# Patient Record
Sex: Male | Born: 1970 | Race: Black or African American | Hispanic: No | Marital: Single | Smoking: Never smoker
Health system: Southern US, Community
[De-identification: ages and names within clinical notes are randomized; demographics above are authoritative.]

## PROBLEM LIST (undated history)

## (undated) DIAGNOSIS — R011 Cardiac murmur, unspecified: Secondary | ICD-10-CM

## (undated) DIAGNOSIS — E785 Hyperlipidemia, unspecified: Secondary | ICD-10-CM

## (undated) DIAGNOSIS — E119 Type 2 diabetes mellitus without complications: Secondary | ICD-10-CM

## (undated) DIAGNOSIS — F4310 Post-traumatic stress disorder, unspecified: Secondary | ICD-10-CM

## (undated) DIAGNOSIS — A159 Respiratory tuberculosis unspecified: Secondary | ICD-10-CM

## (undated) DIAGNOSIS — F32A Depression, unspecified: Secondary | ICD-10-CM

## (undated) HISTORY — DX: Hyperlipidemia, unspecified: E78.5

## (undated) HISTORY — DX: Type 2 diabetes mellitus without complications: E11.9

## (undated) HISTORY — DX: Cardiac murmur, unspecified: R01.1

## (undated) HISTORY — PX: MULTIPLE TOOTH EXTRACTIONS: SHX2053

## (undated) HISTORY — DX: Depression, unspecified: F32.A

## (undated) HISTORY — DX: Respiratory tuberculosis unspecified: A15.9

## (undated) HISTORY — DX: Post-traumatic stress disorder, unspecified: F43.10

---

## 2018-10-07 ENCOUNTER — Encounter: Payer: Self-pay | Admitting: Internal Medicine

## 2018-10-07 ENCOUNTER — Other Ambulatory Visit: Payer: Self-pay

## 2018-10-07 ENCOUNTER — Ambulatory Visit (INDEPENDENT_AMBULATORY_CARE_PROVIDER_SITE_OTHER): Payer: Self-pay | Admitting: Internal Medicine

## 2018-10-07 VITALS — BP 127/87 | HR 94 | Temp 98.4°F | Ht 68.0 in | Wt 160.3 lb

## 2018-10-07 DIAGNOSIS — X58XXXA Exposure to other specified factors, initial encounter: Secondary | ICD-10-CM

## 2018-10-07 DIAGNOSIS — Z0000 Encounter for general adult medical examination without abnormal findings: Secondary | ICD-10-CM | POA: Insufficient documentation

## 2018-10-07 DIAGNOSIS — K029 Dental caries, unspecified: Secondary | ICD-10-CM

## 2018-10-07 DIAGNOSIS — Z6824 Body mass index (BMI) 24.0-24.9, adult: Secondary | ICD-10-CM

## 2018-10-07 DIAGNOSIS — R202 Paresthesia of skin: Secondary | ICD-10-CM | POA: Insufficient documentation

## 2018-10-07 DIAGNOSIS — R634 Abnormal weight loss: Secondary | ICD-10-CM

## 2018-10-07 DIAGNOSIS — M7541 Impingement syndrome of right shoulder: Secondary | ICD-10-CM | POA: Insufficient documentation

## 2018-10-07 DIAGNOSIS — Z87891 Personal history of nicotine dependence: Secondary | ICD-10-CM

## 2018-10-07 DIAGNOSIS — S025XXA Fracture of tooth (traumatic), initial encounter for closed fracture: Secondary | ICD-10-CM
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
## 2018-10-07 NOTE — Assessment & Plan Note (Signed)
Pt declined the influenza vaccine today. He was willing to get HIV and HepC screening  Plan - ordered HIV and HepC screens

## 2018-10-07 NOTE — Assessment & Plan Note (Signed)
Pt has been experiencing numbness and tingling of his L fingertips and toes for approximately 18 months. States these paresthesias are episodic though only on the L side. He cannot identify any exacerbating or remitting factors. Denies any tremor or clumsiness in his hands. He states these symptoms do not limit his day-to-day activities. Denies focal weakness, vision changes, headaches, or dizziness. Neurologic exam is unremarkable without any focal deficits.  Of note, pt endorses a 60lb unintentional weight loss over the last 18 months. No change in appetite. Pt says he eats his largest meals at breakfast and dinner, and eats a lot of candy in between. He has multiple cavities and a broken molar on exam today.  Assessment - L sided paresthesias  No peripheral neuropathy on exam so uncertain etiology at this time. Pt is young and without apparent risk factors for TIA or stroke. Will order basic labs today to assess for diabetes or thyroid dysfunction.  Plan - ordered CBC, CMP, TSH, A1c, lipid panel, and HIV antibody - will continue to monitor
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
## 2018-10-07 NOTE — Progress Notes (Signed)
Internal Medicine Clinic Attending  I saw and evaluated the patient.  I personally confirmed the key portions of the history and exam documented by Dr. Jones and I reviewed pertinent patient test results.  The assessment, diagnosis, and plan were formulated together and I agree with the documentation in the resident's note.     
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
## 2018-10-08 LAB — CMP14 + ANION GAP
ALT: 29 IU/L (ref 0–44)
AST: 21 IU/L (ref 0–40)
Albumin/Globulin Ratio: 1.5 (ref 1.2–2.2)
Albumin: 4.8 g/dL (ref 4.0–5.0)
Alkaline Phosphatase: 78 IU/L (ref 39–117)
Anion Gap: 17 mmol/L (ref 10.0–18.0)
BUN/Creatinine Ratio: 11 (ref 9–20)
BUN: 9 mg/dL (ref 6–24)
Bilirubin Total: 0.5 mg/dL (ref 0.0–1.2)
CO2: 24 mmol/L (ref 20–29)
Calcium: 10.3 mg/dL — ABNORMAL HIGH (ref 8.7–10.2)
Chloride: 97 mmol/L (ref 96–106)
Creatinine, Ser: 0.82 mg/dL (ref 0.76–1.27)
GFR calc Af Amer: 121 mL/min/{1.73_m2} (ref >59)
GFR calc non Af Amer: 105 mL/min/{1.73_m2} (ref >59)
Globulin, Total: 3.2 g/dL (ref 1.5–4.5)
Glucose: 278 mg/dL — ABNORMAL HIGH (ref 65–99)
Potassium: 4.5 mmol/L (ref 3.5–5.2)
Sodium: 138 mmol/L (ref 134–144)
Total Protein: 8 g/dL (ref 6.0–8.5)

## 2018-10-08 LAB — CBC
Hematocrit: 50.7 % (ref 37.5–51.0)
Hemoglobin: 17.6 g/dL (ref 13.0–17.7)
MCH: 29.4 pg (ref 26.6–33.0)
MCHC: 34.7 g/dL (ref 31.5–35.7)
MCV: 85 fL (ref 79–97)
Platelets: 249 10*3/uL (ref 150–450)
RBC: 5.99 x10E6/uL — ABNORMAL HIGH (ref 4.14–5.80)
RDW: 12.6 % (ref 11.6–15.4)
WBC: 3.2 10*3/uL — ABNORMAL LOW (ref 3.4–10.8)

## 2018-10-08 LAB — HEPATITIS C ANTIBODY: Hep C Virus Ab: <0.1 s/co ratio (ref 0.0–0.9)

## 2018-10-08 LAB — LIPID PANEL
Chol/HDL Ratio: 5.1 ratio — ABNORMAL HIGH (ref 0.0–5.0)
Cholesterol, Total: 210 mg/dL — ABNORMAL HIGH (ref 100–199)
HDL: 41 mg/dL (ref >39)
LDL Chol Calc (NIH): 151 mg/dL — ABNORMAL HIGH (ref 0–99)
Triglycerides: 101 mg/dL (ref 0–149)
VLDL Cholesterol Cal: 18 mg/dL (ref 5–40)

## 2018-10-08 LAB — TSH: TSH: 1.32 u[IU]/mL (ref 0.450–4.500)

## 2018-10-08 LAB — HEMOGLOBIN A1C
Est. average glucose Bld gHb Est-mCnc: 280 mg/dL
Hgb A1c MFr Bld: 11.4 % — ABNORMAL HIGH (ref 4.8–5.6)

## 2018-10-08 LAB — HIV ANTIBODY (ROUTINE TESTING W REFLEX): HIV Screen 4th Generation wRfx: NONREACTIVE

## 2018-10-20 ENCOUNTER — Encounter: Payer: Self-pay | Admitting: Internal Medicine

## 2018-10-20 ENCOUNTER — Ambulatory Visit (INDEPENDENT_AMBULATORY_CARE_PROVIDER_SITE_OTHER): Payer: Self-pay | Admitting: Internal Medicine

## 2018-10-20 ENCOUNTER — Other Ambulatory Visit: Payer: Self-pay

## 2018-10-20 VITALS — BP 114/65 | HR 92 | Temp 98.3°F | Ht 68.0 in | Wt 158.8 lb

## 2018-10-20 DIAGNOSIS — E785 Hyperlipidemia, unspecified: Secondary | ICD-10-CM

## 2018-10-20 DIAGNOSIS — M7541 Impingement syndrome of right shoulder: Secondary | ICD-10-CM

## 2018-10-20 DIAGNOSIS — Z7984 Long term (current) use of oral hypoglycemic drugs: Secondary | ICD-10-CM

## 2018-10-20 DIAGNOSIS — Z79899 Other long term (current) drug therapy: Secondary | ICD-10-CM

## 2018-10-20 DIAGNOSIS — E119 Type 2 diabetes mellitus without complications: Secondary | ICD-10-CM

## 2018-10-20 DIAGNOSIS — Z791 Long term (current) use of non-steroidal anti-inflammatories (NSAID): Secondary | ICD-10-CM

## 2018-10-20 DIAGNOSIS — E118 Type 2 diabetes mellitus with unspecified complications: Secondary | ICD-10-CM

## 2018-10-20 LAB — GLUCOSE, CAPILLARY: Glucose-Capillary: 359 mg/dL — ABNORMAL HIGH (ref 70–99)

## 2018-10-20 MED ORDER — EMPAGLIFLOZIN 10 MG PO TABS: 10.0000 mg | ORAL_TABLET | Freq: Every day | ORAL | 1 refills | Status: DC

## 2018-10-20 MED ORDER — NAPROXEN 500 MG PO TABS: 500.0000 mg | ORAL_TABLET | Freq: Two times a day (BID) | ORAL | 0 refills | Status: AC

## 2018-10-20 MED ORDER — METFORMIN HCL 500 MG PO TABS: 500.0000 mg | ORAL_TABLET | Freq: Every day | ORAL | 3 refills | Status: DC

## 2018-10-20 NOTE — Patient Instructions (Addendum)
[name] [name],  It was nice see you today! I have sent in three prescriptions to the pharmacy for you. Take the Naproxen for 7 days to help control the pain in your shoulder. Please take the metformin and empagliflozin daily for your diabetes. I have sent in a referral to [name], our diabetes coordinator, so you can schedule a future appointment with her.  Please follow-up with me in clinic in 4 weeks for your diabetes medications and shoulder.

## 2018-10-20 NOTE — Progress Notes (Signed)
   CC: diabetes follow-up  HPI:  [name] is a 48 y.o. M with significant PMH of diabetes and hyperlipidemia. Please see problem-based charting for follow-up of his chronic medical conditions.  No past medical history on file.   Review of Systems:  Review of Systems  Constitutional: Negative for chills and fever.  Eyes: Negative for blurred vision.  Respiratory: Negative for shortness of breath.   Cardiovascular: Negative for chest pain.  Gastrointestinal: Negative for nausea and vomiting.  Musculoskeletal: Positive for joint pain (R shoulder).  Endo/Heme/Allergies: Negative for polydipsia.   Physical Exam:  Vitals:   10/20/18 1335  BP: 114/65  Pulse: 92  Temp: 98.3 F (36.8 C)  TempSrc: Oral  SpO2: 100%  Weight: 158 lb 12.8 oz (72 kg)  Height: 5\' 8"  (1.727 m)   Physical Exam Vitals signs and nursing note reviewed.  Constitutional:      General: He is not in acute distress.    Appearance: He is normal weight. He is not ill-appearing.  Cardiovascular:     Rate and Rhythm: Normal rate and regular rhythm.     Heart sounds: Normal heart sounds.  Pulmonary:     Effort: Pulmonary effort is normal.     Breath sounds: Normal breath sounds.  Abdominal:     General: Abdomen is flat.     Palpations: Abdomen is soft.  Musculoskeletal:     Comments: Passive and active ROM limited in R shoulder abduction and flexion. No tenderness to palpation of joint. No pain with internal/external rotation. Negative empty can test.   Skin:    General: Skin is warm and dry.  Neurological:     Mental Status: He is alert.      Assessment & Plan:   See Encounters Tab for problem based charting.  Patient seen with Dr. Dareen Piano

## 2018-10-21 DIAGNOSIS — E119 Type 2 diabetes mellitus without complications: Secondary | ICD-10-CM | POA: Insufficient documentation

## 2018-10-21 NOTE — Assessment & Plan Note (Signed)
Pt continuing to have limited mobility and pain in his R shoulder. States that over the last 2 weeks he has been lifting weights to try and strengthen the joint. His exam is unchanged from prior visit on 9/25. We discussed limiting weight lifting and activities that require raising the arm above the head as the joint and tendons need rest in order to heal. He is interested in imaging because his symptoms have been going on for so long, but is willing to continue conservative management and a trail of NSAID's.  Assessment - Shoulder impingement syndrome  Plan - reiterated to rest and limit strain on the shoulder joint - prescribed naproxen 500mg  BID for 7 days - will reassess at follow-up in 1 month - could consider round of PT in the future

## 2018-10-21 NOTE — Assessment & Plan Note (Addendum)
Pt with diabetes diagnosed on 10/07/2018 with A1c of 11.4. Presents today for initial diabetes education and to start medications. He is somewhat familiar with diabetes because of mother, aunts, and other relatives with the disease. He is eager to make dietary changes and motivated to take medications in order to control his blood sugars.  We discussed what diabetes is, how to interpret the A1c, and medication options that lower the blood sugar. He is interested in starting oral medications as he does not feel comfortable with injections at home. Discussed the benefits and potential side effects of metformin and SGLT-2 inhibitors.   Assessment - Newly diagnosed Type II diabetes, poorly controlled  Plan - will begin metformin 500mg  daily - provided pt with metformin titration information to perform at home (500mg  daily for one week, 500mg  BID for one week, 500mg  in the morning and 1000mg  in the evening for one week, and 1000mg  BID for one week) - start empagliflozin 10mg  daily - referral placed to diabetes services here - follow-up in 1 month - will repeat A1c in 2.5 months

## 2018-10-21 NOTE — Progress Notes (Signed)
Internal Medicine Clinic Attending  I saw and evaluated the patient.  I personally confirmed the key portions of the history and exam documented by Dr. Jones and I reviewed pertinent patient test results.  The assessment, diagnosis, and plan were formulated together and I agree with the documentation in the resident's note.     

## 2018-10-26 ENCOUNTER — Other Ambulatory Visit: Payer: Self-pay | Admitting: Internal Medicine

## 2018-10-26 ENCOUNTER — Telehealth: Payer: Self-pay | Admitting: Dietician

## 2018-10-26 ENCOUNTER — Encounter: Payer: Medicaid Other | Admitting: Dietician

## 2018-10-26 DIAGNOSIS — E119 Type 2 diabetes mellitus without complications: Secondary | ICD-10-CM

## 2018-10-26 MED ORDER — CANAGLIFLOZIN 100 MG PO TABS: 100.0000 mg | ORAL_TABLET | Freq: Every day | ORAL | 2 refills | Status: DC

## 2018-10-26 NOTE — Telephone Encounter (Signed)
Called [name] [name] to offer to reschedule today's appointment. He rescheduled for Friday at 9:15 AM. He says he does not have a meter and was not able to get the Jardiance because it was $600.00 and his insurance did not cover it. He agreed to have me share this with his doctor and we can discuss further at his appointment.

## 2018-10-26 NOTE — Progress Notes (Signed)
Pt was unable to pick-up empagliflozin prescription at [facility] due to cost. New prescription for canagliflozin 100mg  daily sent into [facility]. Pt called and notified of medication switch. He expressed understanding of where to go and is aware of appointment on Friday with [name].

## 2018-10-28 ENCOUNTER — Encounter: Payer: Medicaid Other | Admitting: Dietician

## 2018-10-28 NOTE — Addendum Note (Signed)
Addended by: Hulan Fray on: 10/28/2018 02:42 PM   Modules accepted: Orders

## 2018-11-25 ENCOUNTER — Telehealth: Payer: Self-pay | Admitting: Internal Medicine

## 2018-11-25 DIAGNOSIS — E119 Type 2 diabetes mellitus without complications: Secondary | ICD-10-CM

## 2018-11-25 MED ORDER — METFORMIN HCL 1000 MG PO TABS: 1000.0000 mg | ORAL_TABLET | Freq: Two times a day (BID) | ORAL | 2 refills | Status: DC

## 2018-11-25 NOTE — Telephone Encounter (Signed)
RTC to pt, states he has titrated up on the metformin and is now on metformin 1000mg  BID, denies any side effects.  He will run out of the RX soon and is requesting a new RX be sent to [address].  States he has enough through Monday. Thanks, Higinio Roger, RN,BSN

## 2018-11-25 NOTE — Telephone Encounter (Signed)
Pt is calling regarding dosage of medicine [phone]

## 2018-12-12 ENCOUNTER — Other Ambulatory Visit: Payer: Self-pay | Admitting: Internal Medicine

## 2019-05-03 ENCOUNTER — Encounter: Payer: Self-pay | Admitting: *Deleted

## 2019-07-19 ENCOUNTER — Encounter (HOSPITAL_COMMUNITY): Payer: Self-pay | Admitting: Emergency Medicine

## 2019-07-19 ENCOUNTER — Other Ambulatory Visit: Payer: Self-pay

## 2019-07-19 ENCOUNTER — Emergency Department (HOSPITAL_COMMUNITY)
Admission: EM | Admit: 2019-07-19 | Discharge: 2019-07-19 | Disposition: A | Discharge status: Home / Self Care | Payer: Medicaid Other | Attending: Emergency Medicine | Admitting: Emergency Medicine

## 2019-07-19 DIAGNOSIS — W57XXXA Bitten or stung by nonvenomous insect and other nonvenomous arthropods, initial encounter: Secondary | ICD-10-CM | POA: Insufficient documentation

## 2019-07-19 DIAGNOSIS — S30860A Insect bite (nonvenomous) of lower back and pelvis, initial encounter: Secondary | ICD-10-CM | POA: Insufficient documentation

## 2019-07-19 DIAGNOSIS — Z7984 Long term (current) use of oral hypoglycemic drugs: Secondary | ICD-10-CM | POA: Insufficient documentation

## 2019-07-19 DIAGNOSIS — S20462A Insect bite (nonvenomous) of left back wall of thorax, initial encounter: Secondary | ICD-10-CM

## 2019-07-19 DIAGNOSIS — Y999 Unspecified external cause status: Secondary | ICD-10-CM | POA: Insufficient documentation

## 2019-07-19 DIAGNOSIS — Y939 Activity, unspecified: Secondary | ICD-10-CM | POA: Insufficient documentation

## 2019-07-19 DIAGNOSIS — Y929 Unspecified place or not applicable: Secondary | ICD-10-CM | POA: Insufficient documentation

## 2019-07-19 DIAGNOSIS — E1165 Type 2 diabetes mellitus with hyperglycemia: Secondary | ICD-10-CM | POA: Insufficient documentation

## 2019-07-19 DIAGNOSIS — R739 Hyperglycemia, unspecified: Secondary | ICD-10-CM

## 2019-07-19 HISTORY — DX: Type 2 diabetes mellitus without complications: E11.9

## 2019-07-19 LAB — BASIC METABOLIC PANEL
Anion gap: 14 (ref 5–15)
BUN: 9 mg/dL (ref 6–20)
CO2: 27 mmol/L (ref 22–32)
Calcium: 9.7 mg/dL (ref 8.9–10.3)
Chloride: 96 mmol/L — ABNORMAL LOW (ref 98–111)
Creatinine, Ser: 0.98 mg/dL (ref 0.61–1.24)
GFR calc Af Amer: >60 mL/min (ref >60)
GFR calc non Af Amer: >60 mL/min (ref >60)
Glucose, Bld: 359 mg/dL — ABNORMAL HIGH (ref 70–99)
Potassium: 4.5 mmol/L (ref 3.5–5.1)
Sodium: 137 mmol/L (ref 135–145)

## 2019-07-19 LAB — CBC WITH DIFFERENTIAL/PLATELET
Abs Immature Granulocytes: 0.11 10*3/uL — ABNORMAL HIGH (ref 0.00–0.07)
Basophils Absolute: 0.1 10*3/uL (ref 0.0–0.1)
Basophils Relative: 1 %
Eosinophils Absolute: 0 10*3/uL (ref 0.0–0.5)
Eosinophils Relative: 0 %
HCT: 50.8 % (ref 39.0–52.0)
Hemoglobin: 16.9 g/dL (ref 13.0–17.0)
Immature Granulocytes: 1 %
Lymphocytes Relative: 4 %
Lymphs Abs: 0.7 10*3/uL (ref 0.7–4.0)
MCH: 29.6 pg (ref 26.0–34.0)
MCHC: 33.3 g/dL (ref 30.0–36.0)
MCV: 89 fL (ref 80.0–100.0)
Monocytes Absolute: 2.3 10*3/uL — ABNORMAL HIGH (ref 0.1–1.0)
Monocytes Relative: 13 %
Neutro Abs: 14.4 10*3/uL — ABNORMAL HIGH (ref 1.7–7.7)
Neutrophils Relative %: 81 %
Platelets: 242 10*3/uL (ref 150–400)
RBC: 5.71 MIL/uL (ref 4.22–5.81)
RDW: 12.4 % (ref 11.5–15.5)
WBC: 17.6 10*3/uL — ABNORMAL HIGH (ref 4.0–10.5)
nRBC: 0 % (ref 0.0–0.2)

## 2019-07-19 LAB — CBG MONITORING, ED: Glucose-Capillary: 327 mg/dL — ABNORMAL HIGH (ref 70–99)

## 2019-07-19 MED ORDER — LIDOCAINE-EPINEPHRINE (PF) 2 %-1:200000 IJ SOLN
10.0000 mL | Freq: Once | INTRAMUSCULAR | Status: AC
  Administered 2019-07-19: 10 mL
  Filled 2019-07-19: qty 20

## 2019-07-19 MED ORDER — DOXYCYCLINE HYCLATE 100 MG PO CAPS
100.0000 mg | ORAL_CAPSULE | Freq: Two times a day (BID) | ORAL | 0 refills | Status: DC
Start: 2019-07-19 — End: 2019-09-10

## 2019-07-19 MED ORDER — HYDROCODONE-ACETAMINOPHEN 5-325 MG PO TABS
1.0000 | ORAL_TABLET | Freq: Once | ORAL | Status: AC
  Administered 2019-07-19: 1 via ORAL
  Filled 2019-07-19: qty 1

## 2019-07-19 NOTE — ED Triage Notes (Signed)
Pt reports has bite on his back from over a week. Reports when girl friend squeezes on it will get some grainage from it. Pt reports having numbness and tingling in arm and feet. Pt is a diabetic.

## 2019-07-19 NOTE — ED Provider Notes (Signed)
[facility]-EMERGENCY DEPT Provider Note   CSN: [id number] Arrival date & time: 07/19/19  0707     History Chief Complaint  Patient presents with  . Insect Bite    [name] [name] is a 49 y.o. male.  HPI Patient is a 49 year old male past medical history of DM Maczis Metformin presented today with left back pain secondary to "insect bite ".  He states he has no knowledge of an insect bite he denies any known tick bite or recent outdoor activities/hiking or camping.  He states that approximately 7 days ago he noticed the area approximately 4 days ago his fiance attempted to "pop the zit"at which point it seemed to be more painful and more irritated.  Patient endorses bilateral lower extremity numbness but states that this is been ongoing for several years.  He states that his blood sugars are well controlled though he takes his twice daily Metformin as prescribed.  On review of EMR it appears the patient has been recommended for close follow-up by the internal medicine outpatient group however he has not followed up on these appointments.  Patient denies any knowledge of needing to follow-up.    Past Medical History:  Diagnosis Date  . Diabetes mellitus without complication [facility])     Patient Active Problem List   Diagnosis Date Noted  . Type 2 diabetes mellitus without complication, without long-term current use of insulin (HCC) 10/21/2018  . Paresthesia of left upper and lower extremity 10/07/2018  . Healthcare maintenance 10/07/2018  . Shoulder impingement syndrome, right 10/07/2018    History reviewed. No pertinent surgical history.     No family history on file.  Social History   Tobacco Use  . Smoking status: Never Smoker  . Smokeless tobacco: Current User  Substance Use Topics  . Alcohol use: Never  . Drug use: Never    Home Medications Prior to Admission medications   Medication Sig Start Date End Date Taking? Authorizing Provider    acetaminophen (TYLENOL) 500 MG tablet Take 1,000 mg by mouth every 6 (six) hours as needed for mild pain.   Yes [provider]  amoxicillin (AMOXIL) 500 MG capsule Take 500 mg by mouth once as needed. 03/12/19  Yes [provider]  ibuprofen (ADVIL) 200 MG tablet Take 400 mg by mouth every 6 (six) hours as needed.   Yes [provider]  metFORMIN (GLUCOPHAGE) 1000 MG tablet Take 1 tablet (1,000 mg total) by mouth 2 (two) times daily with a meal. 12/12/18  Yes [name], MD  canagliflozin [facility]) 100 MG TABS tablet Take 1 tablet (100 mg total) by mouth daily before breakfast. Patient not taking: Reported on 07/19/2019 10/26/18   [name], MD  doxycycline (VIBRAMYCIN) 100 MG capsule Take 1 capsule (100 mg total) by mouth 2 (two) times daily. 07/19/19   Gailen Shelter, PA    Allergies    Patient has no known allergies.  Review of Systems   Review of Systems  Constitutional: Negative for chills and fever.  HENT: Negative for congestion.   Respiratory: Negative for shortness of breath.   Cardiovascular: Negative for chest pain.  Gastrointestinal: Negative for abdominal pain.  Musculoskeletal: Negative for neck pain.  Skin: Positive for wound.    Physical Exam Updated Vital Signs BP (!) 148/95 (BP Location: Right Arm)   Pulse (!) 102   Temp (!) 97.3 F (36.3 C) (Oral)   Resp 16   SpO2 99%   Physical Exam Vitals and  nursing note reviewed.  Constitutional:      General: He is in acute distress.  HENT:     Head: Normocephalic and atraumatic.     Nose: Nose normal.     Mouth/Throat:     Mouth: Mucous membranes are dry.  Eyes:     General: No scleral icterus. Cardiovascular:     Rate and Rhythm: Normal rate and regular rhythm.     Pulses: Normal pulses.     Heart sounds: Normal heart sounds.  Pulmonary:     Effort: Pulmonary effort is normal. No respiratory distress.     Breath sounds: No wheezing.  Abdominal:     Palpations: Abdomen is  soft.     Tenderness: There is no abdominal tenderness. There is no guarding or rebound.  Musculoskeletal:     Cervical back: Normal range of motion.     Right lower leg: No edema.     Left lower leg: No edema.  Skin:    General: Skin is warm and dry.     Capillary Refill: Capillary refill takes less than 2 seconds.     Comments: Patient with circular 1 cm in diameter tender single papule through left scapular region.  There is some surrounding erythema.  Area is warm to touch.  Neurological:     Mental Status: He is alert. Mental status is at baseline.     Comments: Alert and oriented to self, place, time and event.   Speech is fluent, clear without dysarthria or dysphasia.   Strength 5/5 in upper/lower extremities  Sensation intact in upper/lower extremities   Normal gait.  Negative Romberg. No pronator drift.  Normal finger-to-nose and feet tapping.  CN I not tested  CN II grossly intact visual fields bilaterally. Did not visualize posterior eye.   CN III, IV, VI PERRLA and EOMs intact bilaterally  CN V Intact sensation to sharp and light touch to the face  CN VII facial movements symmetric  CN VIII not tested  CN IX, X no uvula deviation, symmetric rise of soft palate  CN XI 5/5 SCM and trapezius strength bilaterally  CN XII Midline tongue protrusion, symmetric L/R movements.   Psychiatric:        Mood and Affect: Mood normal.        Behavior: Behavior normal.     ED Results / Procedures / Treatments   Labs (all labs ordered are listed, but only abnormal results are displayed) Labs Reviewed  BASIC METABOLIC PANEL - Abnormal; Notable for the following components:      Result Value   Chloride 96 (*)    Glucose, Bld 359 (*)    All other components within normal limits  CBC WITH DIFFERENTIAL/PLATELET - Abnormal; Notable for the following components:   WBC 17.6 (*)    Neutro Abs 14.4 (*)    Monocytes Absolute 2.3 (*)    Abs Immature Granulocytes 0.11 (*)    All  other components within normal limits  CBG MONITORING, ED - Abnormal; Notable for the following components:   Glucose-Capillary 327 (*)    All other components within normal limits  CBG MONITORING, ED    EKG None  Radiology No results found.  Procedures .Marland KitchenIncision and Drainage  Date/Time: 07/19/2019 10:10 AM Performed by: Gailen Shelter, PA Authorized by: Gailen Shelter, PA   Consent:    Consent obtained:  Verbal   Consent given by:  Patient   Risks discussed:  Bleeding, incomplete drainage, pain and damage to  other organs   Alternatives discussed:  No treatment Universal protocol:    Procedure explained and questions answered to patient or proxy's satisfaction: yes     Relevant documents present and verified: yes     Test results available and properly labeled: yes     Imaging studies available: yes     Required blood products, implants, devices, and special equipment available: yes     Site/side marked: yes     Immediately prior to procedure a time out was called: yes     Patient identity confirmed:  Verbally with patient Location:    Type:  Abscess   Size:  1   Location:  Trunk   Trunk location:  Back Pre-procedure details:    Skin preparation:  Betadine Anesthesia (see MAR for exact dosages):    Anesthesia method:  Local infiltration   Local anesthetic:  Lidocaine 2% WITH epi Procedure type:    Complexity:  Simple Procedure details:    Incision types:  Stab incision   Incision depth:  Subcutaneous   Scalpel blade:  11   Wound management:  Probed and deloculated, irrigated with saline and extensive cleaning   Drainage:  Purulent and bloody   Drainage amount:  Scant Post-procedure details:    Patient tolerance of procedure:  Tolerated well, no immediate complications   (including critical care time)  Medications Ordered in ED Medications  HYDROcodone-acetaminophen (NORCO/VICODIN) 5-325 MG per tablet 1 tablet (1 tablet Oral Given 07/19/19 0843)    lidocaine-EPINEPHrine (XYLOCAINE W/EPI) 2 %-1:200000 (PF) injection 10 mL (10 mLs Infiltration Given 07/19/19 0930)    ED Course  I have reviewed the triage vital signs and the nursing notes.  Pertinent labs & imaging results that were available during my care of the patient were reviewed by me and considered in my medical decision making (see chart for details).     MDM Rules/Calculators/A&P                          Patient is a 49 year old poorly controlled diabetic presented today with abscess to left back.  He is overall well-appearing but does have mildly elevated heart rate and blood pressure.  He is somewhat dehydrated he states he has not drank any water or taken his Metformin today but has drank quite a bit of juice.  He denies any nausea vomiting.  He states he has felt hot but denies any measured fevers or consistent fevers.  He states he has bilateral foot numbness but this appears to be a chronic issue for him.  He states his blood sugars well controlled however CBG was 327.  Will check basic labs.  Physical exam is notable for small 1 cm papule possible abscess.  Ultrasound confirms a small area of fluid collection.  This was incised and drained.  BMP notable for blood sugar of 360.  No anion gap.  Bicarb is within normal limits.  Patient does not appear to be in [facility] or DKA. CBC with 17.6 WBC is neutrophil predominant.  Will treat with doxycycline and recommend close follow-up with PCP  My reassessment heart rate remains in the mid to high 90s.  He has not been tachycardic for me.  He is understanding of importance of follow-up and I had a long patient occasion discussion to the understands the long-term ramifications of not controlling his blood sugar.   Final Clinical Impression(s) / ED Diagnoses Final diagnoses:  Infected insect bite of back, left,  initial encounter  Hyperglycemia    Rx / DC Orders ED Discharge Orders         Ordered    doxycycline (VIBRAMYCIN) 100  MG capsule  2 times daily     Discontinue  Reprint     07/19/19 0945           Gailen ShelterFondaw, Dellamae Rosamilia S, PA 07/19/19 1015    Jacalyn LefevreHaviland, Julie, MD 07/19/19 1017

## 2019-07-19 NOTE — Discharge Instructions (Addendum)
Please take doxy as prescribed. Please take for entire course.  Please drink plenty of water.  As we discussed your blood sugar is significantly elevated today.  Please follow-up promptly with your primary care doctor for recheck of this.  And to be somewhat elevated because of the abscess that you have on your shoulder however I suspect that it may require additional blood sugar medication to control this.

## 2019-09-09 ENCOUNTER — Encounter (HOSPITAL_COMMUNITY): Payer: Self-pay

## 2019-09-09 ENCOUNTER — Other Ambulatory Visit: Payer: Self-pay

## 2019-09-09 ENCOUNTER — Emergency Department (HOSPITAL_COMMUNITY): Payer: HRSA Program

## 2019-09-09 DIAGNOSIS — R05 Cough: Secondary | ICD-10-CM | POA: Diagnosis present

## 2019-09-09 DIAGNOSIS — E119 Type 2 diabetes mellitus without complications: Secondary | ICD-10-CM | POA: Insufficient documentation

## 2019-09-09 DIAGNOSIS — U071 COVID-19: Secondary | ICD-10-CM | POA: Insufficient documentation

## 2019-09-09 DIAGNOSIS — Z7984 Long term (current) use of oral hypoglycemic drugs: Secondary | ICD-10-CM | POA: Insufficient documentation

## 2019-09-09 MED ORDER — ACETAMINOPHEN 325 MG PO TABS
650.0000 mg | ORAL_TABLET | Freq: Once | ORAL | Status: AC | PRN
  Administered 2019-09-09: 650 mg via ORAL
  Filled 2019-09-09: qty 2

## 2019-09-09 NOTE — ED Notes (Signed)
Patient refused tylenol at this time.

## 2019-09-09 NOTE — ED Triage Notes (Addendum)
Patient reports he developed cough 2 weeks ago with fevers and sweats at night. Patient says he now has rib pain on left side also. Patient says he is coughing up phlegm. Denies injury.  Pain is 7/10. Patient says his throat is sore also when he coughs.

## 2019-09-10 ENCOUNTER — Emergency Department (HOSPITAL_COMMUNITY)
Admission: EM | Admit: 2019-09-10 | Discharge: 2019-09-10 | Disposition: A | Discharge status: Home / Self Care | Payer: HRSA Program | Attending: Emergency Medicine | Admitting: Emergency Medicine

## 2019-09-10 ENCOUNTER — Encounter (HOSPITAL_COMMUNITY): Payer: Self-pay | Admitting: Emergency Medicine

## 2019-09-10 DIAGNOSIS — E871 Hypo-osmolality and hyponatremia: Secondary | ICD-10-CM

## 2019-09-10 DIAGNOSIS — U071 COVID-19: Secondary | ICD-10-CM

## 2019-09-10 LAB — COMPREHENSIVE METABOLIC PANEL
ALT: 29 U/L (ref 0–44)
AST: 33 U/L (ref 15–41)
Albumin: 3.6 g/dL (ref 3.5–5.0)
Alkaline Phosphatase: 55 U/L (ref 38–126)
Anion gap: 15 (ref 5–15)
BUN: 16 mg/dL (ref 6–20)
CO2: 21 mmol/L — ABNORMAL LOW (ref 22–32)
Calcium: 9 mg/dL (ref 8.9–10.3)
Chloride: 95 mmol/L — ABNORMAL LOW (ref 98–111)
Creatinine, Ser: 0.83 mg/dL (ref 0.61–1.24)
GFR calc Af Amer: >60 mL/min (ref >60)
GFR calc non Af Amer: >60 mL/min (ref >60)
Glucose, Bld: 209 mg/dL — ABNORMAL HIGH (ref 70–99)
Potassium: 4.3 mmol/L (ref 3.5–5.1)
Sodium: 131 mmol/L — ABNORMAL LOW (ref 135–145)
Total Bilirubin: 0.9 mg/dL (ref 0.3–1.2)
Total Protein: 8 g/dL (ref 6.5–8.1)

## 2019-09-10 LAB — CBC WITH DIFFERENTIAL/PLATELET
Abs Immature Granulocytes: 0.02 10*3/uL (ref 0.00–0.07)
Basophils Absolute: 0 10*3/uL (ref 0.0–0.1)
Basophils Relative: 0 %
Eosinophils Absolute: 0 10*3/uL (ref 0.0–0.5)
Eosinophils Relative: 0 %
HCT: 47.8 % (ref 39.0–52.0)
Hemoglobin: 16.1 g/dL (ref 13.0–17.0)
Immature Granulocytes: 1 %
Lymphocytes Relative: 30 %
Lymphs Abs: 1.1 10*3/uL (ref 0.7–4.0)
MCH: 29.2 pg (ref 26.0–34.0)
MCHC: 33.7 g/dL (ref 30.0–36.0)
MCV: 86.6 fL (ref 80.0–100.0)
Monocytes Absolute: 0.6 10*3/uL (ref 0.1–1.0)
Monocytes Relative: 16 %
Neutro Abs: 1.9 10*3/uL (ref 1.7–7.7)
Neutrophils Relative %: 53 %
Platelets: 180 10*3/uL (ref 150–400)
RBC: 5.52 MIL/uL (ref 4.22–5.81)
RDW: 12.9 % (ref 11.5–15.5)
WBC: 3.5 10*3/uL — ABNORMAL LOW (ref 4.0–10.5)
nRBC: 0 % (ref 0.0–0.2)

## 2019-09-10 LAB — SARS CORONAVIRUS 2 BY RT PCR (HOSPITAL ORDER, PERFORMED IN ~~LOC~~ HOSPITAL LAB): SARS Coronavirus 2: POSITIVE — AB

## 2019-09-10 MED ORDER — ONDANSETRON HCL 4 MG PO TABS
4.0000 mg | ORAL_TABLET | Freq: Four times a day (QID) | ORAL | 0 refills | Status: DC | PRN
Start: 2019-09-10 — End: 2020-07-19

## 2019-09-10 MED ORDER — SODIUM CHLORIDE 0.9 % IV BOLUS
1000.0000 mL | Freq: Once | INTRAVENOUS | Status: AC
  Administered 2019-09-10: 1000 mL via INTRAVENOUS

## 2019-09-10 MED ORDER — ONDANSETRON HCL 4 MG/2ML IJ SOLN
4.0000 mg | Freq: Once | INTRAMUSCULAR | Status: AC
  Administered 2019-09-10: 4 mg via INTRAVENOUS
  Filled 2019-09-10: qty 2

## 2019-09-10 MED ORDER — ALBUTEROL SULFATE HFA 108 (90 BASE) MCG/ACT IN AERS
2.0000 | INHALATION_SPRAY | Freq: Once | RESPIRATORY_TRACT | Status: AC
  Administered 2019-09-10: 2 via RESPIRATORY_TRACT
  Filled 2019-09-10: qty 6.7

## 2019-09-10 NOTE — ED Provider Notes (Signed)
Patient care received from Dr. Preston Fleeting.  Patient with covid like illness.  Covit test pending.  Physical Exam  BP 133/88 (BP Location: Right Arm)   Pulse 81   Temp 100.1 F (37.8 C) (Oral)   Resp 16   Ht 1.727 m (5\' 8" )   Wt 72.6 kg   SpO2 97%   BMI 24.33 kg/m   Physical Exam WDWN male nad  ED Course/Procedures     Procedures  MDM  Patient with covid like illness. Will assess for mab therapy. Patient states he got sick two weeks ago Friday, now on day 16 of illness.  Initially began with cough.  Not canidate for Mab therapy. Fiancee with covid and seen in Wednesday. CXR with bilateral lower lobe infiltrates. Denies dyspnea. Discussed return precautions and need for follow up and voices understanding. Discussed antipyretic use, oral rehydration. [name] [name] was evaluated in Emergency Department on 09/10/2019 for the symptoms described in the history of present illness. He was evaluated in the context of the global COVID-19 pandemic, which necessitated consideration that the patient might be at risk for infection with the SARS-CoV-2 virus that causes COVID-19. Institutional protocols and algorithms that pertain to the evaluation of patients at risk for COVID-19 are in a state of rapid change based on information released by regulatory bodies including the CDC and federal and state organizations. These policies and algorithms were followed during the patient's care in the ED.        09/12/2019, MD 09/10/19 [phone]

## 2019-09-10 NOTE — ED Notes (Signed)
Date and time results received: 09/10/19 7:54 AM  (use smartphrase ".now" to insert current time)  Test: Covid  Critical Value: positive  Name of Provider Notified: Taquita, RN  Orders Received? 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

## 2019-09-10 NOTE — ED Provider Notes (Signed)
[facility]   CSN: [id number] Arrival date & time: 09/09/19  1213   History Chief Complaint  Patient presents with  . Cough  . left rib pain    [name] [name] is a 49 y.o. male.  The history is provided by the patient.  Cough He has history of diabetes and comes in with not feeling well for the last 2 weeks.  He has had a cough which is mostly nonproductive.  There have been subjective fever as well as chills and sweats.  He is complaining of generalized body aches.  He is feeling generally weak.  There has been some nausea and occasional vomiting.  He denies any diarrhea.  Although he has not lost his sense of taste, he states that there is a better after taste which makes him not want to eat anything after having taken 1 bite.  He denies any change in his sense of smell.  He was exposed to 2 people who had COVID-19.  He has not been vaccinated against COVID-19.  He denies dyspnea except during coughing episodes.  He is a non-smoker.  Past Medical History:  Diagnosis Date  . Diabetes mellitus without complication [facility])     Patient Active Problem List   Diagnosis Date Noted  . Type 2 diabetes mellitus without complication, without long-term current use of insulin (HCC) 10/21/2018  . Paresthesia of left upper and lower extremity 10/07/2018  . Healthcare maintenance 10/07/2018  . Shoulder impingement syndrome, right 10/07/2018    No past surgical history on file.     No family history on file.  Social History   Tobacco Use  . Smoking status: Never Smoker  . Smokeless tobacco: Current User  Substance Use Topics  . Alcohol use: Never  . Drug use: Never    Home Medications Prior to Admission medications   Medication Sig Start Date End Date Taking? Authorizing Provider  acetaminophen (TYLENOL) 500 MG tablet Take 1,000 mg by mouth every 6 (six) hours as needed for mild pain.    [provider]  amoxicillin (AMOXIL)  500 MG capsule Take 500 mg by mouth once as needed. 03/12/19   [provider]  canagliflozin (INVOKANA) 100 MG TABS tablet Take 1 tablet (100 mg total) by mouth daily before breakfast. Patient not taking: Reported on 07/19/2019 10/26/18   [name], MD  doxycycline (VIBRAMYCIN) 100 MG capsule Take 1 capsule (100 mg total) by mouth 2 (two) times daily. 07/19/19   Gailen Shelter, PA  ibuprofen (ADVIL) 200 MG tablet Take 400 mg by mouth every 6 (six) hours as needed.    [provider]  metFORMIN (GLUCOPHAGE) 1000 MG tablet Take 1 tablet (1,000 mg total) by mouth 2 (two) times daily with a meal. 12/12/18   [name], MD    Allergies    Patient has no known allergies.  Review of Systems   Review of Systems  Respiratory: Positive for cough.   All other systems reviewed and are negative.   Physical Exam Updated Vital Signs BP 110/76   Pulse 84   Temp 100.3 F (37.9 C)   Resp 15   Ht 5\' 8"  (1.727 m)   Wt 72.6 kg   SpO2 93%   BMI 24.33 kg/m   Physical Exam Vitals and nursing note reviewed.   49 year old male, appears mildly dyspneic at rest, but is in no acute distress. Vital signs are significant for low-grade fever. Oxygen saturation is 93%,  which is normal. Head is normocephalic and atraumatic. PERRLA, EOMI. Oropharynx is clear. Neck is nontender and supple without adenopathy or JVD. Back is nontender and there is no CVA tenderness. Lungs have faint bibasilar rales without wheezes or rhonchi. Chest is nontender. Heart has regular rate and rhythm without murmur. Abdomen is soft, flat, nontender without masses or hepatosplenomegaly and peristalsis is hypoactive. Extremities have no cyanosis or edema, full range of motion is present. Skin is warm and dry without rash. Neurologic: Mental status is normal, cranial nerves are intact, there are no motor or sensory deficits.   ED Results / Procedures / Treatments   Labs (all labs ordered are listed, but only  abnormal results are displayed) Labs Reviewed  SARS CORONAVIRUS 2 BY RT PCR (HOSPITAL ORDER, PERFORMED IN [facility]) - Abnormal; Notable for the following components:      Result Value   SARS Coronavirus 2 POSITIVE (*)    All other components within normal limits  COMPREHENSIVE METABOLIC PANEL - Abnormal; Notable for the following components:   Sodium 131 (*)    Chloride 95 (*)    CO2 21 (*)    Glucose, Bld 209 (*)    All other components within normal limits  CBC WITH DIFFERENTIAL/PLATELET - Abnormal; Notable for the following components:   WBC 3.5 (*)    All other components within normal limits   Radiology DG Chest 2 View  Result Date: 09/09/2019 CLINICAL DATA:  Cough, fever, diaphoretic at night. EXAM: CHEST - 2 VIEW COMPARISON:  None. FINDINGS: The heart size and mediastinal contours are within normal limits. There are infiltrates in the bilateral lower lungs. No pneumothorax or pleural effusion. No acute finding in the visualized skeleton. IMPRESSION: Bilateral lower lobe infiltrates concerning for infection. Recommend radiographic follow-up in 3-4 weeks to ensure resolution. Electronically Signed   By: Emmaline Kluver M.D.   On: 09/09/2019 14:04    Procedures Procedures  Medications Ordered in ED Medications  acetaminophen (TYLENOL) tablet 650 mg (650 mg Oral Given 09/09/19 1631)    ED Course  I have reviewed the triage vital signs and the nursing notes.  Pertinent labs & imaging results that were available during my care of the patient were reviewed by me and considered in my medical decision making (see chart for details).  MDM Rules/Calculators/A&P Flulike illness which is most likely COVID-19.  Will check COVID-19 PCR.  Chest x-ray had been obtained at triage and does show bibasilar infiltrates consistent with COVID-19.  Will check screening labs and give IV fluids and trial of albuterol inhaler.  Old records are reviewed, and he has no relevant past  visits.  Labs show mild hyponatremia and mild leukopenia, which are both consistent with COVID-19 infection.  He does feel somewhat better following above-noted treatments.  Cough has improved with albuterol, nausea has improved with ondansetron.  Covid 19 test has come back positive.  Will attempt to have him get monoclonal antibody infusion.  Case has been signed out to Dr. Rosalia Hammers.  [name] [name] was evaluated in Emergency Department on 09/10/2019 for the symptoms described in the history of present illness. He was evaluated in the context of the global COVID-19 pandemic, which necessitated consideration that the patient might be at risk for infection with the SARS-CoV-2 virus that causes COVID-19. Institutional protocols and algorithms that pertain to the evaluation of patients at risk for COVID-19 are in a state of rapid change based on information released by regulatory bodies including the CDC and  federal and state organizations. These policies and algorithms were followed during the patient's care in the ED.  Final Clinical Impression(s) / ED Diagnoses Final diagnoses:  COVID-19 virus infection  Hyponatremia    Rx / DC Orders ED Discharge Orders    None       [name], MD 09/10/19 [phone]

## 2019-09-10 NOTE — Discharge Instructions (Signed)
Drink plenty of fluids. Monitor blood sugar Call tomorrow for follow up at covid clinic. Return if you are worsening, especially if you become short of breath.

## 2019-09-10 NOTE — ED Notes (Signed)
Pt refused vitals 

## 2020-07-17 ENCOUNTER — Emergency Department (HOSPITAL_COMMUNITY): Payer: Self-pay

## 2020-07-17 ENCOUNTER — Other Ambulatory Visit: Payer: Self-pay

## 2020-07-17 ENCOUNTER — Inpatient Hospital Stay (HOSPITAL_COMMUNITY)
Admission: EM | Admit: 2020-07-17 | Discharge: 2020-07-19 | DRG: 872 | Disposition: A | Discharge status: Home / Self Care | Payer: Self-pay | Attending: Family Medicine | Admitting: Family Medicine

## 2020-07-17 DIAGNOSIS — E1165 Type 2 diabetes mellitus with hyperglycemia: Secondary | ICD-10-CM | POA: Diagnosis present

## 2020-07-17 DIAGNOSIS — M25561 Pain in right knee: Secondary | ICD-10-CM | POA: Diagnosis present

## 2020-07-17 DIAGNOSIS — Z28310 Unvaccinated for covid-19: Secondary | ICD-10-CM

## 2020-07-17 DIAGNOSIS — N12 Tubulo-interstitial nephritis, not specified as acute or chronic: Secondary | ICD-10-CM

## 2020-07-17 DIAGNOSIS — E871 Hypo-osmolality and hyponatremia: Secondary | ICD-10-CM | POA: Diagnosis present

## 2020-07-17 DIAGNOSIS — R079 Chest pain, unspecified: Secondary | ICD-10-CM

## 2020-07-17 DIAGNOSIS — E872 Acidosis: Secondary | ICD-10-CM | POA: Diagnosis present

## 2020-07-17 DIAGNOSIS — N390 Urinary tract infection, site not specified: Secondary | ICD-10-CM | POA: Diagnosis present

## 2020-07-17 DIAGNOSIS — R739 Hyperglycemia, unspecified: Secondary | ICD-10-CM

## 2020-07-17 DIAGNOSIS — Z20822 Contact with and (suspected) exposure to covid-19: Secondary | ICD-10-CM | POA: Diagnosis present

## 2020-07-17 DIAGNOSIS — A419 Sepsis, unspecified organism: Principal | ICD-10-CM | POA: Diagnosis present

## 2020-07-17 DIAGNOSIS — E861 Hypovolemia: Secondary | ICD-10-CM | POA: Diagnosis present

## 2020-07-17 DIAGNOSIS — R7401 Elevation of levels of liver transaminase levels: Secondary | ICD-10-CM | POA: Diagnosis present

## 2020-07-17 DIAGNOSIS — E1140 Type 2 diabetes mellitus with diabetic neuropathy, unspecified: Secondary | ICD-10-CM | POA: Diagnosis present

## 2020-07-17 DIAGNOSIS — F17290 Nicotine dependence, other tobacco product, uncomplicated: Secondary | ICD-10-CM | POA: Diagnosis present

## 2020-07-17 LAB — COMPREHENSIVE METABOLIC PANEL
ALT: 47 U/L — ABNORMAL HIGH (ref 0–44)
AST: 44 U/L — ABNORMAL HIGH (ref 15–41)
Albumin: 3.8 g/dL (ref 3.5–5.0)
Alkaline Phosphatase: 70 U/L (ref 38–126)
Anion gap: 14 (ref 5–15)
BUN: 13 mg/dL (ref 6–20)
CO2: 19 mmol/L — ABNORMAL LOW (ref 22–32)
Calcium: 9.3 mg/dL (ref 8.9–10.3)
Chloride: 93 mmol/L — ABNORMAL LOW (ref 98–111)
Creatinine, Ser: 1.1 mg/dL (ref 0.61–1.24)
GFR, Estimated: >60 mL/min (ref >60)
Glucose, Bld: 281 mg/dL — ABNORMAL HIGH (ref 70–99)
Potassium: 4.3 mmol/L (ref 3.5–5.1)
Sodium: 126 mmol/L — ABNORMAL LOW (ref 135–145)
Total Bilirubin: 1.4 mg/dL — ABNORMAL HIGH (ref 0.3–1.2)
Total Protein: 7.8 g/dL (ref 6.5–8.1)

## 2020-07-17 LAB — URINALYSIS, ROUTINE W REFLEX MICROSCOPIC
Bilirubin Urine: NEGATIVE
Glucose, UA: >=500 mg/dL — AB
Ketones, ur: 80 mg/dL — AB
Leukocytes,Ua: NEGATIVE
Nitrite: NEGATIVE
Protein, ur: 100 mg/dL — AB
Specific Gravity, Urine: 1.036 — ABNORMAL HIGH (ref 1.005–1.030)
pH: 5 (ref 5.0–8.0)

## 2020-07-17 LAB — CBC WITH DIFFERENTIAL/PLATELET
Abs Immature Granulocytes: 0.01 10*3/uL (ref 0.00–0.07)
Basophils Absolute: 0 10*3/uL (ref 0.0–0.1)
Basophils Relative: 0 %
Eosinophils Absolute: 0 10*3/uL (ref 0.0–0.5)
Eosinophils Relative: 0 %
HCT: 48.1 % (ref 39.0–52.0)
Hemoglobin: 16.9 g/dL (ref 13.0–17.0)
Immature Granulocytes: 0 %
Lymphocytes Relative: 18 %
Lymphs Abs: 1.2 10*3/uL (ref 0.7–4.0)
MCH: 29.5 pg (ref 26.0–34.0)
MCHC: 35.1 g/dL (ref 30.0–36.0)
MCV: 83.9 fL (ref 80.0–100.0)
Monocytes Absolute: 1 10*3/uL (ref 0.1–1.0)
Monocytes Relative: 15 %
Neutro Abs: 4.3 10*3/uL (ref 1.7–7.7)
Neutrophils Relative %: 67 %
Platelets: 201 10*3/uL (ref 150–400)
RBC: 5.73 MIL/uL (ref 4.22–5.81)
RDW: 12.1 % (ref 11.5–15.5)
WBC: 6.4 10*3/uL (ref 4.0–10.5)
nRBC: 0 % (ref 0.0–0.2)

## 2020-07-17 LAB — RESP PANEL BY RT-PCR (FLU A&B, COVID) ARPGX2
Influenza A by PCR: NEGATIVE
Influenza B by PCR: NEGATIVE
SARS Coronavirus 2 by RT PCR: NEGATIVE

## 2020-07-17 LAB — LACTIC ACID, PLASMA: Lactic Acid, Venous: 1.3 mmol/L (ref 0.5–1.9)

## 2020-07-17 LAB — CBG MONITORING, ED: Glucose-Capillary: 284 mg/dL — ABNORMAL HIGH (ref 70–99)

## 2020-07-17 MED ORDER — ENOXAPARIN SODIUM 40 MG/0.4ML IJ SOSY
40.0000 mg | PREFILLED_SYRINGE | Freq: Every day | INTRAMUSCULAR | Status: DC
  Administered 2020-07-18: 40 mg via SUBCUTANEOUS
  Filled 2020-07-17: qty 0.4

## 2020-07-17 MED ORDER — ACETAMINOPHEN 325 MG PO TABS
650.0000 mg | ORAL_TABLET | Freq: Once | ORAL | Status: AC
  Administered 2020-07-17: 650 mg via ORAL
  Filled 2020-07-17: qty 2

## 2020-07-17 MED ORDER — SODIUM CHLORIDE 0.9 % IV BOLUS
1000.0000 mL | Freq: Once | INTRAVENOUS | Status: AC
  Administered 2020-07-18: 1000 mL via INTRAVENOUS

## 2020-07-17 MED ORDER — MORPHINE SULFATE (PF) 4 MG/ML IV SOLN
4.0000 mg | Freq: Once | INTRAVENOUS | Status: AC
  Administered 2020-07-18: 4 mg via INTRAVENOUS
  Filled 2020-07-17: qty 1

## 2020-07-17 MED ORDER — SODIUM CHLORIDE 0.9 % IV SOLN
1.0000 g | INTRAVENOUS | Status: DC
  Administered 2020-07-18: 1 g via INTRAVENOUS
  Filled 2020-07-17: qty 10

## 2020-07-17 MED ORDER — SODIUM CHLORIDE 0.9 % IV SOLN
1.0000 g | Freq: Once | INTRAVENOUS | Status: AC
  Administered 2020-07-17: 1 g via INTRAVENOUS
  Filled 2020-07-17: qty 10

## 2020-07-17 MED ORDER — SODIUM CHLORIDE 0.9 % IV BOLUS
1000.0000 mL | Freq: Once | INTRAVENOUS | Status: AC
  Administered 2020-07-17: 1000 mL via INTRAVENOUS

## 2020-07-17 NOTE — ED Provider Notes (Signed)
Emergency Medicine Provider Triage Evaluation Note  [name] [name] , a 50 y.o. male  was evaluated in triage.  Pt complains of generalized body aches, fevers, chills, and headache.  Patient has been having the symptoms for the last week.  Patient denies any known sick contacts.  Patient has not been vaccinated for COVID-19.    Patient denies any URI symptoms, abdominal pain, nausea, vomiting, diarrhea, dysuria.  Review of Systems  Positive: Generalized myalgias, Fevers, chills, headache Negative: URI symptoms, abdominal pain, nausea, vomiting, diarrhea, dysuria  Physical Exam  BP 128/83   Pulse (!) 117   Temp (!) 101.6 F (38.7 C)   Resp (!) 22   Ht 5\' 8"  (1.727 m)   Wt 75.8 kg   SpO2 97%   BMI 25.39 kg/m  Gen:   Awake, no distress   Resp:  Normal effort, lungs clear to auscultation bilaterally MSK:   Moves extremities without difficulty.  No swelling, edema, or tenderness to bilateral lower extremities. Other:  Abdomen soft, nondistended, nontender.  Patient has no wounds to bilateral feet.  Medical Decision Making  Medically screening exam initiated at 5:45 PM.  Appropriate orders placed.  Dontrelle Shear was informed that the remainder of the evaluation will be completed by another provider, this initial triage assessment does not replace that evaluation, and the importance of remaining in the ED until their evaluation is complete.  The patient appears stable so that the remainder of the work up may be completed by another provider.      Freida Busman, PA-C 07/17/20 1748    09/17/20, DO 07/17/20 2227

## 2020-07-17 NOTE — ED Triage Notes (Signed)
C/O high blood sugar and generalized aches and pains. Stated takes metformin at home, denies other PMH.

## 2020-07-17 NOTE — H&P (Addendum)
History and Physical  [name] [name] ZJI:[id number] DOB: [birth year] DOA: 07/17/2020  Referring physician: PA, [name]  PCP: Pcp, No  Outpatient Specialists: None Patient coming from: Home.  Chief Complaint: Hyperglycemia, bilateral flank pain  HPI: [name] [name] is a 50 y.o. male with medical history significant for uncontrolled type 2 diabetes who presented to [facility] ED due to lower back pain all across his back, subjective fever, and hyperglycemia.  Onset about a week ago, gradually worsening.  Associated with right knee pain.  Today his blood sugar was in the 400s, he took his neighbors insulin with improvement, repeated blood sugar was in the 200s.  Symptoms associated with shaking chills and a mild headache.  No nausea or vomiting or abdominal pain.  No cardiopulmonary symptoms.  He presented to the ED for further evaluation.  COVID-19 screening test negative.  CT renal unremarkable.  Work-up in the ED revealed presumptive UTI and uncontrolled type 2 diabetes with hyperglycemia.  Patient was started on Rocephin and received 2 L IV fluid bolus normal saline.  TRH, hospitalist team, was asked to admit.  ED Course: T-max 101.6.  BP 133/85, pulse 117, respiration rate 22, saturation 100% on room air.  Lab studies remarkable for serum sodium 126, glucose 281, serum bicarb 19, BUN 13, creatinine 1.10, AST 44, ALT 47, T bilirubin 1.4.  Alkaline phosphatase 70.  WBC 6.4, hemoglobin 16.9, MCV 83, platelet 201.  Review of Systems: Review of systems as noted in the HPI. All other systems reviewed and are negative.   Past Medical History:  Diagnosis Date   Diabetes mellitus without complication (HCC)    No past surgical history on file.  Social History:  reports that he has never smoked. He uses smokeless tobacco. He reports that he does not drink alcohol and does not use drugs.   No Known Allergies  Family history: Paternal aunt with diabetes. His father is deceased, he was killed.  Prior to  Admission medications   Medication Sig Start Date End Date Taking? Authorizing Provider  acetaminophen (TYLENOL) 500 MG tablet Take 1,000 mg by mouth every 6 (six) hours as needed for mild pain.    [provider]  canagliflozin (INVOKANA) 100 MG TABS tablet Take 1 tablet (100 mg total) by mouth daily before breakfast. Patient not taking: Reported on 07/19/2019 10/26/18   [name], MD  ibuprofen (ADVIL) 200 MG tablet Take 400 mg by mouth every 6 (six) hours as needed for fever or moderate pain.     [provider]  metFORMIN (GLUCOPHAGE) 1000 MG tablet Take 1 tablet (1,000 mg total) by mouth 2 (two) times daily with a meal. 12/12/18   [name], MD  ondansetron (ZOFRAN) 4 MG tablet Take 1 tablet (4 mg total) by mouth every 6 (six) hours as needed for nausea or vomiting. 09/10/19   [name], MD    Physical Exam: BP 133/85   Pulse 98   Temp 99.1 F (37.3 C) (Oral)   Resp 16   Ht 5\' 8"  (1.727 m)   Wt 75.8 kg   SpO2 97%   BMI 25.39 kg/m   General: 50 y.o. year-old male well developed well nourished in no acute distress.  Alert and oriented x3. Cardiovascular: Regular rate and rhythm with no rubs or gallops.  No thyromegaly or JVD noted.  No lower extremity edema. 2/4 pulses in all 4 extremities. Respiratory: Clear to auscultation with no wheezes or rales. Good inspiratory effort. Abdomen: Soft nontender nondistended with normal bowel sounds  x4 quadrants. Muskuloskeletal: No cyanosis, clubbing or edema noted bilaterally.  Right knee very tender with minimal palpation.  Not erythematous, no edema noted. Neuro: CN II-XII intact, strength, sensation, reflexes Skin: No ulcerative lesions noted or rashes Psychiatry: Judgement and insight appear normal. Mood is appropriate for condition and setting          Labs on Admission:  Basic Metabolic Panel: Recent Labs  Lab 07/17/20 1741  NA 126*  K 4.3  CL 93*  CO2 19*  GLUCOSE 281*  BUN 13  CREATININE 1.10   CALCIUM 9.3   Liver Function Tests: Recent Labs  Lab 07/17/20 1741  AST 44*  ALT 47*  ALKPHOS 70  BILITOT 1.4*  PROT 7.8  ALBUMIN 3.8   No results for input(s): LIPASE, AMYLASE in the last 168 hours. No results for input(s): AMMONIA in the last 168 hours. CBC: Recent Labs  Lab 07/17/20 1741  WBC 6.4  NEUTROABS 4.3  HGB 16.9  HCT 48.1  MCV 83.9  PLT 201   Cardiac Enzymes: No results for input(s): CKTOTAL, CKMB, CKMBINDEX, TROPONINI in the last 168 hours.  BNP (last 3 results) No results for input(s): BNP in the last 8760 hours.  ProBNP (last 3 results) No results for input(s): PROBNP in the last 8760 hours.  CBG: Recent Labs  Lab 07/17/20 1756  GLUCAP 284*    Radiological Exams on Admission: DG Chest 1 View  Result Date: 07/17/2020 CLINICAL DATA:  Chest pain EXAM: CHEST  1 VIEW COMPARISON:  09/09/2019 FINDINGS: The heart size and mediastinal contours are within normal limits. Both lungs are clear. The visualized skeletal structures are unremarkable. IMPRESSION: No active disease. Electronically Signed   By: Jasmine Pang M.D.   On: 07/17/2020 18:52   CT Renal Stone Study  Result Date: 07/17/2020 CLINICAL DATA:  Flank pain. Laterality is not indicated. High blood sugar and generalized aches and pains. EXAM: CT ABDOMEN AND PELVIS WITHOUT CONTRAST TECHNIQUE: Multidetector CT imaging of the abdomen and pelvis was performed following the standard protocol without IV contrast. COMPARISON:  None. FINDINGS: Lower chest: Lung bases are clear. Hepatobiliary: No focal liver abnormality is seen. No gallstones, gallbladder wall thickening, or biliary dilatation. Pancreas: Unremarkable. No pancreatic ductal dilatation or surrounding inflammatory changes. Spleen: Normal in size without focal abnormality. Adrenals/Urinary Tract: Adrenal glands are unremarkable. Kidneys are normal, without renal calculi, focal lesion, or hydronephrosis. Bladder is unremarkable. Stomach/Bowel: Stomach,  small bowel, and colon are not abnormally distended. Stool fills the colon. No wall thickening or inflammatory changes are appreciated. Appendix is normal. Vascular/Lymphatic: No significant vascular findings are present. No enlarged abdominal or pelvic lymph nodes. Reproductive: Prostate is unremarkable. Other: No abdominal wall hernia or abnormality. No abdominopelvic ascites. Musculoskeletal: No acute or significant osseous findings. IMPRESSION: No renal or ureteral stone or obstruction. Electronically Signed   By: Burman Nieves M.D.   On: 07/17/2020 21:59    EKG: I independently viewed the EKG done and my findings are as followed: Sinus rhythm rate of 99.  Nonspecific ST-T changes.  QTc 410.  Assessment/Plan Present on Admission:  UTI (urinary tract infection)  Active Problems:   UTI (urinary tract infection)  Sepsis secondary to presumptive UTI Presented with T-max 101.6, tachycardic, tachypneic, UA positive for pyuria, and bilateral flank pain. CT renal unremarkable. Started on Rocephin in the ED, continue. Received 2 L IV fluid bolus normal saline. Start IV fluid hydration. Monitor fever curve and WBC Follow-up blood cultures x2 and urine culture for ID and  sensitivities.  Type 2 diabetes with hyperglycemia Obtain hemoglobin A1c and fasting lipid panel in the morning Hold off home oral hypoglycemics Start insulin sliding scale. Diabetes coordinator for patient's education.  Hypovolemic hyponatremia Serum sodium 126, serum glucose 281 Serum sodium corrected for hyperglycemia 129. Continue IV fluid hydration, normal saline at 75 cc/h x 2 days. Repeat BMP in the morning.  Anion gap metabolic acidosis Presented with serum bicarb of 19 and anion gap of 14 Continue IV fluid  Acute transaminitis AST ALT and T bilirubin elevated Avoid hepatotoxic agents Repeat CMP in the morning.  Right knee pain, nontraumatic ?Monoarthritis No evidence of effusion noted on exam however  very tender with minimal palpation. Continue Rocephin started empirically for presumptive UTI. Analgesics as needed Continue to closely monitor    DVT prophylaxis: Subcu Lovenox daily  Code Status: Full code  Family Communication: None at bedside  Disposition Plan: Admit to MedSurg unit  Consults called: None  Admission status: Observation status   Status is: Observation   Dispo:  Patient From: Home  Planned Disposition: Home possibly on 07/19/2020 or once symptomatology has improved.  Medically stable for discharge: No      Darlin Drop MD [facility] Pager [phone]  If 7PM-7AM, please contact night-coverage [email or web address] Password [email or web address]  07/17/2020, 11:32 PM

## 2020-07-17 NOTE — ED Provider Notes (Signed)
[facility] EMERGENCY DEPARTMENT Provider Note   CSN: [id number] Arrival date & time: 07/17/20  1652     History Chief Complaint  Patient presents with   Hyperglycemia   Generalized Body Aches    [name] [name] is a 50 y.o. male with friend past medical history of diabetes on metformin that presents to the emerge department today for generalized body aches, fevers, hyperglycemia and back pain.  Patient states that he started feeling ill with the symptoms a week and a half ago, have been worsening.  Patient states that he is primarily here for his hyperglycemia, is not on insulin.  He states that his sugars were in the upper 400s today, took his neighbors insulin, unsure which kind of insulin it was.  States that he is never done this before, sugar here in the 200s.  Patient states that he was unable to get out of bed a couple mornings ago because of his body aches and his fevers and chills.  States that his lower back has been hurting him, on both sides.  Also states that he has a mild headache.  Denies any neck pain or vision changes.  Denies any confusion.  Denies any nausea vomiting abdominal pain difficulty urinating, hematuria..  Denies any chest pain or shortness of breath.  Denies any cough or URI symptoms.  Has not been vaccinated against COVID.  Denies any sick contacts.  Denies any substance use, saddle paresthesias, IV drug use, urinary retention or incontinence or bowel incontinence.  No other complaints at this time.  HPI     Past Medical History:  Diagnosis Date   Diabetes mellitus without complication [facility])     Patient Active Problem List   Diagnosis Date Noted   Type 2 diabetes mellitus without complication, without long-term current use of insulin (HCC) 10/21/2018   Paresthesia of left upper and lower extremity 10/07/2018   Healthcare maintenance 10/07/2018   Shoulder impingement syndrome, right 10/07/2018    No past surgical history on  file.     No family history on file.  Social History   Tobacco Use   Smoking status: Never   Smokeless tobacco: Current  Substance Use Topics   Alcohol use: Never   Drug use: Never    Home Medications Prior to Admission medications   Medication Sig Start Date End Date Taking? Authorizing Provider  acetaminophen (TYLENOL) 500 MG tablet Take 1,000 mg by mouth every 6 (six) hours as needed for mild pain.    [provider]  canagliflozin (INVOKANA) 100 MG TABS tablet Take 1 tablet (100 mg total) by mouth daily before breakfast. Patient not taking: Reported on 07/19/2019 10/26/18   [name], MD  ibuprofen (ADVIL) 200 MG tablet Take 400 mg by mouth every 6 (six) hours as needed for fever or moderate pain.     [provider]  metFORMIN (GLUCOPHAGE) 1000 MG tablet Take 1 tablet (1,000 mg total) by mouth 2 (two) times daily with a meal. 12/12/18   [name], MD  ondansetron (ZOFRAN) 4 MG tablet Take 1 tablet (4 mg total) by mouth every 6 (six) hours as needed for nausea or vomiting. 09/10/19   [name], MD    Allergies    Patient has no known allergies.  Review of Systems   Review of Systems  Constitutional:  Positive for chills, fatigue and fever. Negative for diaphoresis.  HENT:  Negative for congestion, sore throat and trouble swallowing.   Eyes:  Negative for pain  and visual disturbance.  Respiratory:  Negative for cough, shortness of breath and wheezing.   Cardiovascular:  Negative for chest pain, palpitations and leg swelling.  Gastrointestinal:  Negative for abdominal distention, abdominal pain, diarrhea, nausea and vomiting.  Genitourinary:  Negative for difficulty urinating.  Musculoskeletal:  Positive for arthralgias and back pain. Negative for neck pain and neck stiffness.  Skin:  Negative for pallor.  Neurological:  Positive for headaches. Negative for dizziness, speech difficulty and weakness.  Psychiatric/Behavioral:  Negative for confusion.     Physical Exam Updated Vital Signs BP 133/85   Pulse 98   Temp 99.1 F (37.3 C) (Oral)   Resp 16   Ht 5\' 8"  (1.727 m)   Wt 75.8 kg   SpO2 97%   BMI 25.39 kg/m   Physical Exam Constitutional:      General: He is in acute distress.     Appearance: Normal appearance. He is not ill-appearing, toxic-appearing or diaphoretic.     Comments: Appears uncomfortable.   HENT:     Mouth/Throat:     Mouth: Mucous membranes are moist.     Pharynx: Oropharynx is clear.  Eyes:     General: No scleral icterus.    Extraocular Movements: Extraocular movements intact.     Pupils: Pupils are equal, round, and reactive to light.  Cardiovascular:     Rate and Rhythm: Normal rate and regular rhythm.     Pulses: Normal pulses.     Heart sounds: Normal heart sounds.  Pulmonary:     Effort: Pulmonary effort is normal. No respiratory distress.     Breath sounds: Normal breath sounds. No stridor. No wheezing, rhonchi or rales.  Chest:     Chest wall: No tenderness.  Abdominal:     General: Abdomen is flat. There is no distension.     Palpations: Abdomen is soft.     Tenderness: There is no abdominal tenderness. There is no guarding or rebound.  Musculoskeletal:        General: No swelling or tenderness. Normal range of motion.     Cervical back: Normal range of motion and neck supple. No rigidity.       Back:     Right lower leg: No edema.     Left lower leg: No edema.     Comments: Patient does have tenderness that spans from his right lower back to his left lower back, does cross midline.  No specific pinpoint tenderness on lumbar spine.  No erythema or warmth.  Patient is able to range back normally.  No objective numbness.  Negative leg raise.  Skin:    General: Skin is warm and dry.     Capillary Refill: Capillary refill takes less than 2 seconds.     Coloration: Skin is not pale.  Neurological:     General: No focal deficit present.     Mental Status: He is alert and oriented to  person, place, and time.  Psychiatric:        Mood and Affect: Mood normal.        Behavior: Behavior normal.    ED Results / Procedures / Treatments   Labs (all labs ordered are listed, but only abnormal results are displayed) Labs Reviewed  COMPREHENSIVE METABOLIC PANEL - Abnormal; Notable for the following components:      Result Value   Sodium 126 (*)    Chloride 93 (*)    CO2 19 (*)    Glucose, Bld 281 (*)  AST 44 (*)    ALT 47 (*)    Total Bilirubin 1.4 (*)    All other components within normal limits  URINALYSIS, ROUTINE W REFLEX MICROSCOPIC - Abnormal; Notable for the following components:   APPearance HAZY (*)    Specific Gravity, Urine 1.036 (*)    Glucose, UA >=500 (*)    Hgb urine dipstick SMALL (*)    Ketones, ur 80 (*)    Protein, ur 100 (*)    Bacteria, UA RARE (*)    All other components within normal limits  CBG MONITORING, ED - Abnormal; Notable for the following components:   Glucose-Capillary 284 (*)    All other components within normal limits  RESP PANEL BY RT-PCR (FLU A&B, COVID) ARPGX2  URINE CULTURE  CULTURE, BLOOD (ROUTINE X 2)  CULTURE, BLOOD (ROUTINE X 2)  CBC WITH DIFFERENTIAL/PLATELET  LACTIC ACID, PLASMA  LACTIC ACID, PLASMA    EKG EKG Interpretation  Date/Time:  Wednesday July 17 2020 20:39:21 EDT Ventricular Rate:  99 PR Interval:  165 QRS Duration: 78 QT Interval:  319 QTC Calculation: 410 R Axis:   73 Text Interpretation: Sinus rhythm Biatrial enlargement No old tracing to compare Confirmed by Linwood DibblesKnapp, Jon [phone]([id number]) on 07/17/2020 8:47:48 PM  Radiology DG Chest 1 View  Result Date: 07/17/2020 CLINICAL DATA:  Chest pain EXAM: CHEST  1 VIEW COMPARISON:  09/09/2019 FINDINGS: The heart size and mediastinal contours are within normal limits. Both lungs are clear. The visualized skeletal structures are unremarkable. IMPRESSION: No active disease. Electronically Signed   By: Jasmine PangKim  Fujinaga M.D.   On: 07/17/2020 18:52   CT Renal Stone  Study  Result Date: 07/17/2020 CLINICAL DATA:  Flank pain. Laterality is not indicated. High blood sugar and generalized aches and pains. EXAM: CT ABDOMEN AND PELVIS WITHOUT CONTRAST TECHNIQUE: Multidetector CT imaging of the abdomen and pelvis was performed following the standard protocol without IV contrast. COMPARISON:  None. FINDINGS: Lower chest: Lung bases are clear. Hepatobiliary: No focal liver abnormality is seen. No gallstones, gallbladder wall thickening, or biliary dilatation. Pancreas: Unremarkable. No pancreatic ductal dilatation or surrounding inflammatory changes. Spleen: Normal in size without focal abnormality. Adrenals/Urinary Tract: Adrenal glands are unremarkable. Kidneys are normal, without renal calculi, focal lesion, or hydronephrosis. Bladder is unremarkable. Stomach/Bowel: Stomach, small bowel, and colon are not abnormally distended. Stool fills the colon. No wall thickening or inflammatory changes are appreciated. Appendix is normal. Vascular/Lymphatic: No significant vascular findings are present. No enlarged abdominal or pelvic lymph nodes. Reproductive: Prostate is unremarkable. Other: No abdominal wall hernia or abnormality. No abdominopelvic ascites. Musculoskeletal: No acute or significant osseous findings. IMPRESSION: No renal or ureteral stone or obstruction. Electronically Signed   By: Burman NievesWilliam  Stevens M.D.   On: 07/17/2020 21:59    Procedures Procedures   Medications Ordered in ED Medications  sodium chloride 0.9 % bolus 1,000 mL (has no administration in time range)  morphine 4 MG/ML injection 4 mg (has no administration in time range)  acetaminophen (TYLENOL) tablet 650 mg (650 mg Oral Given 07/17/20 1901)  sodium chloride 0.9 % bolus 1,000 mL (1,000 mLs Intravenous New Bag/Given 07/17/20 2120)  cefTRIAXone (ROCEPHIN) 1 g in sodium chloride 0.9 % 100 mL IVPB (1 g Intravenous New Bag/Given 07/17/20 2128)    ED Course  I have reviewed the triage vital signs and the  nursing notes.  Pertinent labs & imaging results that were available during my care of the patient were reviewed by me and considered in my  medical decision making (see chart for details).    MDM Rules/Calculators/A&P                          Patient presents to the emerge department today for hyperglycemia.  Patient does have a fever and is tachycardic, sepsis work-up initiated.  Patient without white count, lactic acid pending.  Blood cultures and fluids started.  Source most likely UTI, antibiotics initiated.  Concern for pyelonephritis or nephrolithiasis at this time.  Patient does not appear septic, however meet sepsis criteria.  In regards to back pain, it is spanning throughout lumbar region, no midline tenderness, no red flag symptoms besides patient having fever.  Do not think this is a cord process, patient does not have any specific midline tenderness.   Will obtain CT imaging. Work-up today shows CMP with sodium of 126, after corrected for hyperglycemia sodium is 130.  CO2 19, patient does not have an anion gap however does have glucose and ketones in urine..  CBG is 281.  Fluids initiated.  Urinalysis does show small hemoglobin, ketones, protein white blood cells and bacteria.  CT imaging without any signs of nephrolithiasis.  Patient is still complaining of pain in lower back, is still tachycardic after fluids.  Lactic acid normal.  Patient to be admitted at this time for most likely pyelonephritis, will need IV abx, will also benefit from admission due to uncontrolled diabetes.  Pt admitted to Dr. Margo Aye  The patient appears reasonably stabilized for admission considering the current resources, flow, and capabilities available in the ED at this time, and I doubt any other [facility] requiring further screening and/or treatment in the ED prior to admission.   Final Clinical Impression(s) / ED Diagnoses Final diagnoses:  Pyelonephritis  Hyperglycemia    Rx / DC Orders ED Discharge  Orders     None        Farrel Gordon, PA-C 07/17/20 2315    Linwood Dibbles, MD 07/18/20 1140

## 2020-07-17 NOTE — ED Notes (Signed)
The patient informed [facility] that his hands were cramping up. Luna Kitchens also stated that the patient diaphoretic to the touch.

## 2020-07-18 ENCOUNTER — Encounter (HOSPITAL_COMMUNITY): Payer: Self-pay | Admitting: Internal Medicine

## 2020-07-18 LAB — LIPID PANEL
Cholesterol: 164 mg/dL (ref 0–200)
HDL: 26 mg/dL — ABNORMAL LOW (ref >40)
LDL Cholesterol: 110 mg/dL — ABNORMAL HIGH (ref 0–99)
Total CHOL/HDL Ratio: 6.3 RATIO
Triglycerides: 140 mg/dL (ref <150)
VLDL: 28 mg/dL (ref 0–40)

## 2020-07-18 LAB — CBC
HCT: 46.5 % (ref 39.0–52.0)
HCT: 45.7 % (ref 39.0–52.0)
Hemoglobin: 16.1 g/dL (ref 13.0–17.0)
Hemoglobin: 16 g/dL (ref 13.0–17.0)
MCH: 29.6 pg (ref 26.0–34.0)
MCH: 29.3 pg (ref 26.0–34.0)
MCHC: 34.6 g/dL (ref 30.0–36.0)
MCHC: 35 g/dL (ref 30.0–36.0)
MCV: 85.5 fL (ref 80.0–100.0)
MCV: 83.7 fL (ref 80.0–100.0)
Platelets: 184 10*3/uL (ref 150–400)
Platelets: 181 10*3/uL (ref 150–400)
RBC: 5.44 MIL/uL (ref 4.22–5.81)
RBC: 5.46 MIL/uL (ref 4.22–5.81)
RDW: 12.3 % (ref 11.5–15.5)
RDW: 12.2 % (ref 11.5–15.5)
WBC: 6.9 10*3/uL (ref 4.0–10.5)
WBC: 6.4 10*3/uL (ref 4.0–10.5)
nRBC: 0 % (ref 0.0–0.2)
nRBC: 0 % (ref 0.0–0.2)

## 2020-07-18 LAB — COMPREHENSIVE METABOLIC PANEL
ALT: 42 U/L (ref 0–44)
AST: 39 U/L (ref 15–41)
Albumin: 3.3 g/dL — ABNORMAL LOW (ref 3.5–5.0)
Alkaline Phosphatase: 60 U/L (ref 38–126)
Anion gap: 10 (ref 5–15)
BUN: 14 mg/dL (ref 6–20)
CO2: 20 mmol/L — ABNORMAL LOW (ref 22–32)
Calcium: 8.2 mg/dL — ABNORMAL LOW (ref 8.9–10.3)
Chloride: 102 mmol/L (ref 98–111)
Creatinine, Ser: 0.97 mg/dL (ref 0.61–1.24)
GFR, Estimated: >60 mL/min (ref >60)
Glucose, Bld: 195 mg/dL — ABNORMAL HIGH (ref 70–99)
Potassium: 3.9 mmol/L (ref 3.5–5.1)
Sodium: 132 mmol/L — ABNORMAL LOW (ref 135–145)
Total Bilirubin: 0.8 mg/dL (ref 0.3–1.2)
Total Protein: 6.7 g/dL (ref 6.5–8.1)

## 2020-07-18 LAB — GLUCOSE, CAPILLARY
Glucose-Capillary: 185 mg/dL — ABNORMAL HIGH (ref 70–99)
Glucose-Capillary: 191 mg/dL — ABNORMAL HIGH (ref 70–99)
Glucose-Capillary: 217 mg/dL — ABNORMAL HIGH (ref 70–99)
Glucose-Capillary: 253 mg/dL — ABNORMAL HIGH (ref 70–99)
Glucose-Capillary: 297 mg/dL — ABNORMAL HIGH (ref 70–99)

## 2020-07-18 LAB — HIV ANTIBODY (ROUTINE TESTING W REFLEX): HIV Screen 4th Generation wRfx: NONREACTIVE

## 2020-07-18 LAB — HEMOGLOBIN A1C
Hgb A1c MFr Bld: 13.5 % — ABNORMAL HIGH (ref 4.8–5.6)
Mean Plasma Glucose: 340.75 mg/dL

## 2020-07-18 LAB — CREATININE, SERUM
Creatinine, Ser: 0.99 mg/dL (ref 0.61–1.24)
GFR, Estimated: >60 mL/min (ref >60)

## 2020-07-18 LAB — LACTIC ACID, PLASMA: Lactic Acid, Venous: 1.3 mmol/L (ref 0.5–1.9)

## 2020-07-18 MED ORDER — ACETAMINOPHEN 325 MG PO TABS
650.0000 mg | ORAL_TABLET | Freq: Four times a day (QID) | ORAL | Status: DC | PRN
  Administered 2020-07-18 – 2020-07-19 (×2): 650 mg via ORAL
  Filled 2020-07-18 (×3): qty 2

## 2020-07-18 MED ORDER — MORPHINE SULFATE (PF) 4 MG/ML IV SOLN: 4.0000 mg | INTRAVENOUS | Status: DC | PRN

## 2020-07-18 MED ORDER — SODIUM CHLORIDE 0.9 % IV SOLN: INTRAVENOUS | Status: DC

## 2020-07-18 MED ORDER — INSULIN ASPART 100 UNIT/ML IJ SOLN
0.0000 [IU] | Freq: Three times a day (TID) | INTRAMUSCULAR | Status: DC
Start: 2020-07-18 — End: 2020-07-18
  Administered 2020-07-18: 2 [IU] via SUBCUTANEOUS
  Administered 2020-07-18: 5 [IU] via SUBCUTANEOUS
  Administered 2020-07-18: 3 [IU] via SUBCUTANEOUS

## 2020-07-18 MED ORDER — INSULIN STARTER KIT- PEN NEEDLES (ENGLISH)
1.0000 | Freq: Once | Status: DC
  Filled 2020-07-18: qty 1

## 2020-07-18 MED ORDER — INSULIN ASPART 100 UNIT/ML IJ SOLN
0.0000 [IU] | Freq: Every day | INTRAMUSCULAR | Status: DC
  Administered 2020-07-18: 3 [IU] via SUBCUTANEOUS

## 2020-07-18 MED ORDER — MELATONIN 3 MG PO TABS: 3.0000 mg | ORAL_TABLET | Freq: Every evening | ORAL | Status: DC | PRN

## 2020-07-18 MED ORDER — SODIUM CHLORIDE 0.9 % IV BOLUS
500.0000 mL | Freq: Once | INTRAVENOUS | Status: AC
  Administered 2020-07-18: 500 mL via INTRAVENOUS

## 2020-07-18 MED ORDER — SENNOSIDES-DOCUSATE SODIUM 8.6-50 MG PO TABS
1.0000 | ORAL_TABLET | Freq: Every day | ORAL | Status: DC
  Administered 2020-07-18: 1 via ORAL
  Filled 2020-07-18: qty 1

## 2020-07-18 MED ORDER — INSULIN GLARGINE 100 UNIT/ML ~~LOC~~ SOLN
10.0000 [IU] | Freq: Every day | SUBCUTANEOUS | Status: DC
  Administered 2020-07-18: 10 [IU] via SUBCUTANEOUS
  Filled 2020-07-18 (×3): qty 0.1

## 2020-07-18 MED ORDER — INSULIN ASPART 100 UNIT/ML IJ SOLN
0.0000 [IU] | Freq: Three times a day (TID) | INTRAMUSCULAR | Status: DC
Start: 2020-07-19 — End: 2020-07-19
  Administered 2020-07-19: 5 [IU] via SUBCUTANEOUS
  Administered 2020-07-19: 8 [IU] via SUBCUTANEOUS
  Administered 2020-07-19: 5 [IU] via SUBCUTANEOUS

## 2020-07-18 MED ORDER — ONDANSETRON HCL 4 MG/2ML IJ SOLN: 4.0000 mg | Freq: Four times a day (QID) | INTRAMUSCULAR | Status: DC | PRN

## 2020-07-18 MED ORDER — OXYCODONE HCL 5 MG PO TABS
5.0000 mg | ORAL_TABLET | ORAL | Status: DC | PRN
Start: 2020-07-18 — End: 2020-07-19

## 2020-07-18 NOTE — Progress Notes (Signed)
Vitals were checked, patient temp 102.1. PRN tylenol given. Charge nurse aware, MD notified. No new orders at this time. Will continue to monitor.  07/18/20 1402  Assess: MEWS Score  Temp (!) 102.1 F (38.9 C) (RN notified)  BP (!) 141/72  Pulse Rate 99  Resp 18  SpO2 93 %  O2 Device Room Air  Assess: MEWS Score  MEWS Temp 2  MEWS Systolic 0  MEWS Pulse 0  MEWS RR 0  MEWS LOC 0  MEWS Score 2  MEWS Score Color Yellow  Assess: if the MEWS score is Yellow or Red  Were vital signs taken at a resting state? Yes  Focused Assessment No change from prior assessment  Early Detection of Sepsis Score *See Row Information* Medium  MEWS guidelines implemented *See Row Information* Yes  Treat  MEWS Interventions Administered prn meds/treatments  Pain Scale 0-10  Pain Score 4  Take Vital Signs  Increase Vital Sign Frequency  Yellow: Q 2hr X 2 then Q 4hr X 2, if remains yellow, continue Q 4hrs  Escalate  MEWS: Escalate Yellow: discuss with charge nurse/RN and consider discussing with provider and RRT  Notify: Charge Nurse/RN  Name of Charge Nurse/RN Notified Marcelino Duster  Date Charge Nurse/RN Notified 07/18/20  Time Charge Nurse/RN Notified 1415  Notify: Provider  Provider Name/Title Danford, MD  Date Provider Notified 07/18/20  Time Provider Notified 1435  Notification Type Page (secured chat)  Notification Reason Change in status  Provider response No new orders  Date of Provider Response 07/18/20  Time of Provider Response 1436

## 2020-07-18 NOTE — TOC Initial Note (Addendum)
Transition of Care [facility]) - Initial/Assessment Note    Patient Details  Name: [name] [name] MRN: [id number] Date of Birth: Jun 01, 1970  Transition of Care [facility]) CM/SW Contact:    Lockie Pares, RN Phone Number: 07/18/2020, 8:13 AM  Clinical Narrative:                  50 YO type II diabetic, admitted with UTI fever. From home by self, diabetes educator recommended to see if adjustments needed. Patient is uninsured, this needs to be a consideration in insulin type if needed. May need MATCH for medications, Send to [facility] upon DC (except weekends). CM will follow for needs  0900 Appointment made for [facility] PCP  for follow up and to establish a PCP  Expected Discharge Plan: Home/Self Care Barriers to Discharge: Continued Medical Work up   Patient Goals and CMS Choice        Expected Discharge Plan and Services Expected Discharge Plan: Home/Self Care       Living arrangements for the past 2 months: Apartment                                      Prior Living Arrangements/Services Living arrangements for the past 2 months: Apartment Lives with:: Self Patient language and need for interpreter reviewed:: Yes        Need for Family Participation in Patient Care: Yes (Comment) Care giver support system in place?: Yes (comment)   Criminal Activity/Legal Involvement Pertinent to Current Situation/Hospitalization: No - Comment as needed  Activities of Daily Living Home Assistive Devices/Equipment: None ADL Screening (condition at time of admission) Patient's cognitive ability adequate to safely complete daily activities?: Yes Is the patient deaf or have difficulty hearing?: No Does the patient have difficulty seeing, even when wearing glasses/contacts?: No Does the patient have difficulty concentrating, remembering, or making decisions?: No Patient able to express need for assistance with ADLs?: No Does the patient have difficulty dressing or bathing?:  No Independently performs ADLs?: Yes (appropriate for developmental age) Does the patient have difficulty walking or climbing stairs?: No Weakness of Legs: None Weakness of Arms/Hands: None  Permission Sought/Granted                  Emotional Assessment       Orientation: : Oriented to Situation, Oriented to  Time, Oriented to Place, Oriented to Self Alcohol / Substance Use: Not Applicable Psych Involvement: No (comment)  Admission diagnosis:  UTI (urinary tract infection) [N39.0] Pyelonephritis [N12] Hyperglycemia [R73.9] Chest pain [R07.9] Patient Active Problem List   Diagnosis Date Noted   UTI (urinary tract infection) 07/17/2020   Type 2 diabetes mellitus without complication, without long-term current use of insulin (HCC) 10/21/2018   Paresthesia of left upper and lower extremity 10/07/2018   Healthcare maintenance 10/07/2018   Shoulder impingement syndrome, right 10/07/2018   PCP:  Pcp, No Pharmacy:   [id number] [place], [place] - [address] [place] [address] Phone: [phone] Fax: [phone]     Social Determinants of Health (SDOH) Interventions    Readmission Risk Interventions No flowsheet data found.

## 2020-07-18 NOTE — ED Notes (Signed)
Attempted to give reportx1 

## 2020-07-18 NOTE — Progress Notes (Signed)
[facility] PROGRESS NOTE    [name] [name]  EHU:[id number] DOB: 14-Nov-1970 DOA: 07/17/2020 PCP: Pcp, No      Brief Narrative:  [name] [name] is a 50 y.o. M with uncontrolled DM who presented with fever, hyperglycemia, and low back pain over the last week, now worsening as well as chills and malaise.  In the ER, he was febrile to 101, tachycardic and tachypneic.  Urinalysis was suggestive of fever and he was started on empiric ceftriaxone and admitted.         Assessment & Plan:  Sepsis due to UTI Patient presented with generalized malaise, decreased mentation, T-max 101, tachycardia and pyuria.  CT renal showed no hydronephrosis or stone. - Continue Rocephin - Follow blood and urine cultures  Type 2 diabetes, poorly controlled, with hyperglycemia A1c 13%, he takes no medicines at home, does not have a PCP - Continue Lantus - Increase sliding scale -COnsult social work  Hyponatremia Due to sepsis. Na up to 130s from 124 yesterday with fluids.           Disposition: Status is: Inpatient  Remains inpatient appropriate because:IV treatments appropriate due to intensity of illness or inability to take PO  Dispo:  Patient From: Home  Planned Disposition: Home  Medically stable for discharge: No         Level of care: Med-Surg       MDM: The below labs and imaging reports were reviewed and summarized above.  Medication management as above.    DVT prophylaxis: enoxaparin (LOVENOX) injection 40 mg Start: 07/17/20 2330 SCDs Start: 07/17/20 2321  Code Status: FULL Family Communication:           Subjective: No chest pain, confusion, vomiting.  He has pain all over his body, tingling in his hands, still has severe back pain, generalized weakness, feels very weak and sick.  Unable to get out of bed.  Objective: Vitals:   07/18/20 0730 07/18/20 1402 07/18/20 1403 07/18/20 1558  BP: 136/64 (!) 141/72 (!) 141/72 122/63  Pulse: 98  99 99 94  Resp: 17 18 18    Temp: 99.1 F (37.3 C) (!) 102.1 F (38.9 C) (!) 102.1 F (38.9 C) 100.1 F (37.8 C)  TempSrc: Oral Oral  Oral  SpO2: 94% 93%  91%  Weight:      Height:        Intake/Output Summary (Last 24 hours) at 07/18/2020 1752 Last data filed at 07/18/2020 7858 Gross per 24 hour  Intake 1613.43 ml  Output --  Net 1613.43 ml   Filed Weights   07/17/20 1741  Weight: 75.8 kg    Examination: General appearance:  adult male, alert and in mild distress from back pain.   HEENT: Anicteric, conjunctiva pink, lids and lashes normal. No nasal deformity, discharge, epistaxis.  Lips moist, oropharynx dry, no oral lesions, hearing normal, dentition normal.   Skin: Warm and dry.  no jaundice.  No suspicious rashes or lesions. Cardiac: Tachycardic, regular, nl S1-S2, no murmurs appreciated.  Capillary refill is brisk.  JVP normal.  No LE edema.  Radial  pulses 2+ and symmetric. Respiratory: Normal respiratory rate and rhythm.  CTAB without rales or wheezes. Abdomen: Abdomen soft.  Moderate nonfocal TTP with voluntary guarding. No ascites, distension, hepatosplenomegaly.   MSK: No deformities or effusions. Neuro: Awake and alert.  EOMI, moves all extremities. Speech fluent.    Psych: Sensorium intact and responding to questions, attention normal. Affect blunted.  Judgment and insight appear normal.  Data Reviewed: I have personally reviewed following labs and imaging studies:  CBC: Recent Labs  Lab 07/17/20 1741 07/18/20 0021 07/18/20 0224  WBC 6.4 6.9 6.4  NEUTROABS 4.3  --   --   HGB 16.9 16.1 16.0  HCT 48.1 46.5 45.7  MCV 83.9 85.5 83.7  PLT 201 184 010   Basic Metabolic Panel: Recent Labs  Lab 07/17/20 1741 07/18/20 0021 07/18/20 0224  NA 126*  --  132*  K 4.3  --  3.9  CL 93*  --  102  CO2 19*  --  20*  GLUCOSE 281*  --  195*  BUN 13  --  14  CREATININE 1.10 0.99 0.97  CALCIUM 9.3  --  8.2*   GFR: Estimated Creatinine Clearance: 88.1 mL/min (by  C-G formula based on SCr of 0.97 mg/dL). Liver Function Tests: Recent Labs  Lab 07/17/20 1741 07/18/20 0224  AST 44* 39  ALT 47* 42  ALKPHOS 70 60  BILITOT 1.4* 0.8  PROT 7.8 6.7  ALBUMIN 3.8 3.3*   No results for input(s): LIPASE, AMYLASE in the last 168 hours. No results for input(s): AMMONIA in the last 168 hours. Coagulation Profile: No results for input(s): INR, PROTIME in the last 168 hours. Cardiac Enzymes: No results for input(s): CKTOTAL, CKMB, CKMBINDEX, TROPONINI in the last 168 hours. BNP (last 3 results) No results for input(s): PROBNP in the last 8760 hours. HbA1C: Recent Labs    07/18/20 0224  HGBA1C 13.5*   CBG: Recent Labs  Lab 07/17/20 1756 07/18/20 0236 07/18/20 0753 07/18/20 1149 07/18/20 1636  GLUCAP 284* 185* 191* 217* 253*   Lipid Profile: Recent Labs    07/18/20 0224  CHOL 164  HDL 26*  LDLCALC 110*  TRIG 140  CHOLHDL 6.3   Thyroid Function Tests: No results for input(s): TSH, T4TOTAL, FREET4, T3FREE, THYROIDAB in the last 72 hours. Anemia Panel: No results for input(s): VITAMINB12, FOLATE, FERRITIN, TIBC, IRON, RETICCTPCT in the last 72 hours. Urine analysis:    Component Value Date/Time   COLORURINE YELLOW 07/17/2020 1741   APPEARANCEUR HAZY (A) 07/17/2020 1741   LABSPEC 1.036 (H) 07/17/2020 1741   PHURINE 5.0 07/17/2020 1741   GLUCOSEU >=500 (A) 07/17/2020 1741   HGBUR SMALL (A) 07/17/2020 1741   BILIRUBINUR NEGATIVE 07/17/2020 1741   KETONESUR 80 (A) 07/17/2020 1741   PROTEINUR 100 (A) 07/17/2020 1741   NITRITE NEGATIVE 07/17/2020 1741   LEUKOCYTESUR NEGATIVE 07/17/2020 1741   Sepsis Labs: $[email or web address]'[id number]$ (procalcitonin:4,lacticacidven:4)  ) Recent Results (from the past 240 hour(s))  Resp Panel by RT-PCR (Flu A&B, Covid) Nasopharyngeal Swab     Status: None   Collection Time: 07/17/20  6:49 PM   Specimen: Nasopharyngeal Swab; Nasopharyngeal(NP) swabs in vial transport medium  Result Value Ref Range Status   SARS  Coronavirus 2 by RT PCR NEGATIVE NEGATIVE Final    Comment: (NOTE) SARS-CoV-2 target nucleic acids are NOT DETECTED.  The SARS-CoV-2 RNA is generally detectable in upper respiratory specimens during the acute phase of infection. The lowest concentration of SARS-CoV-2 viral copies this assay can detect is 138 copies/mL. A negative result does not preclude SARS-Cov-2 infection and should not be used as the sole basis for treatment or other patient management decisions. A negative result may occur with  improper specimen collection/handling, submission of specimen other than nasopharyngeal swab, presence of viral mutation(s) within the areas targeted by this assay, and inadequate number of viral copies(<138 copies/mL). A negative result must be combined with clinical observations, patient  history, and epidemiological information. The expected result is Negative.  Fact Sheet for Patients:  EntrepreneurPulse.com.au  Fact Sheet for Healthcare Providers:  IncredibleEmployment.be  This test is no t yet approved or cleared by the [place] FDA and  has been authorized for detection and/or diagnosis of SARS-CoV-2 by FDA under an Emergency Use Authorization (EUA). This EUA will remain  in effect (meaning this test can be used) for the duration of the COVID-19 declaration under Section 564(b)(1) of the Act, 21 U.S.C.section 360bbb-3(b)(1), unless the authorization is terminated  or revoked sooner.       Influenza A by PCR NEGATIVE NEGATIVE Final   Influenza B by PCR NEGATIVE NEGATIVE Final    Comment: (NOTE) The Xpert Xpress SARS-CoV-2/FLU/RSV plus assay is intended as an aid in the diagnosis of influenza from Nasopharyngeal swab specimens and should not be used as a sole basis for treatment. Nasal washings and aspirates are unacceptable for Xpert Xpress SARS-CoV-2/FLU/RSV testing.  Fact Sheet for  Patients: EntrepreneurPulse.com.au  Fact Sheet for Healthcare Providers: IncredibleEmployment.be  This test is not yet approved or cleared by the [place] FDA and has been authorized for detection and/or diagnosis of SARS-CoV-2 by FDA under an Emergency Use Authorization (EUA). This EUA will remain in effect (meaning this test can be used) for the duration of the COVID-19 declaration under Section 564(b)(1) of the Act, 21 U.S.C. section 360bbb-3(b)(1), unless the authorization is terminated or revoked.  Performed at [facility], [place] [address], [place], [place] [address]   Blood Culture (routine x 2)     Status: None (Preliminary result)   Collection Time: 07/17/20  8:45 PM   Specimen: BLOOD  Result Value Ref Range Status   Specimen Description BLOOD RIGHT ARM  Final   Special Requests   Final    BOTTLES DRAWN AEROBIC AND ANAEROBIC Blood Culture adequate volume   Culture   Final    NO GROWTH < 12 HOURS Performed at [facility], [place] [address] [place], [place] [address]    Report Status PENDING  Incomplete  Blood Culture (routine x 2)     Status: None (Preliminary result)   Collection Time: 07/17/20  8:50 PM   Specimen: BLOOD RIGHT HAND  Result Value Ref Range Status   Specimen Description BLOOD RIGHT HAND  Final   Special Requests   Final    BOTTLES DRAWN AEROBIC AND ANAEROBIC Blood Culture adequate volume   Culture   Final    NO GROWTH < 12 HOURS Performed at [facility], [place] [address] [place], [place] [address]    Report Status PENDING  Incomplete         Radiology Studies: DG Chest 1 View  Result Date: 07/17/2020 CLINICAL DATA:  Chest pain EXAM: CHEST  1 VIEW COMPARISON:  09/09/2019 FINDINGS: The heart size and mediastinal contours are within normal limits. Both lungs are clear. The visualized skeletal structures are unremarkable. IMPRESSION: No active disease. Electronically Signed   By: Donavan Foil  M.D.   On: 07/17/2020 18:52   CT Renal Stone Study  Result Date: 07/17/2020 CLINICAL DATA:  Flank pain. Laterality is not indicated. High blood sugar and generalized aches and pains. EXAM: CT ABDOMEN AND PELVIS WITHOUT CONTRAST TECHNIQUE: Multidetector CT imaging of the abdomen and pelvis was performed following the standard protocol without IV contrast. COMPARISON:  None. FINDINGS: Lower chest: Lung bases are clear. Hepatobiliary: No focal liver abnormality is seen. No gallstones, gallbladder wall thickening, or  biliary dilatation. Pancreas: Unremarkable. No pancreatic ductal dilatation or surrounding inflammatory changes. Spleen: Normal in size without focal abnormality. Adrenals/Urinary Tract: Adrenal glands are unremarkable. Kidneys are normal, without renal calculi, focal lesion, or hydronephrosis. Bladder is unremarkable. Stomach/Bowel: Stomach, small bowel, and colon are not abnormally distended. Stool fills the colon. No wall thickening or inflammatory changes are appreciated. Appendix is normal. Vascular/Lymphatic: No significant vascular findings are present. No enlarged abdominal or pelvic lymph nodes. Reproductive: Prostate is unremarkable. Other: No abdominal wall hernia or abnormality. No abdominopelvic ascites. Musculoskeletal: No acute or significant osseous findings. IMPRESSION: No renal or ureteral stone or obstruction. Electronically Signed   By: Lucienne Capers M.D.   On: 07/17/2020 21:59        Scheduled Meds:  enoxaparin (LOVENOX) injection  40 mg Subcutaneous QHS   insulin aspart  0-5 Units Subcutaneous QHS   insulin aspart  0-9 Units Subcutaneous TID WC   insulin glargine  10 Units Subcutaneous Daily   insulin starter kit- pen needles  1 kit Other Once   senna-docusate  1 tablet Oral QHS   Continuous Infusions:  sodium chloride 125 mL/hr at 07/18/20 1401   cefTRIAXone (ROCEPHIN)  IV       LOS: 0 days    Time spent: 25 minutes    Edwin Dada, MD [facility] 07/18/2020, 5:52 PM     Please page though [place] or Epic secure chat:  For [employer], Adult nurse

## 2020-07-18 NOTE — Progress Notes (Addendum)
Inpatient Diabetes Program Recommendations  AACE/ADA: New Consensus Statement on Inpatient Glycemic Control (2015)  Target Ranges:  Prepandial:   less than 140 mg/dL      Peak postprandial:   less than 180 mg/dL (1-2 hours)      Critically ill patients:  140 - 180 mg/dL   Results for [name], [name] (MRN [id number]) as of 07/18/2020 08:23  Ref. Range 07/19/2019 08:51 07/17/2020 17:56 07/18/2020 02:36 07/18/2020 07:53  Glucose-Capillary Latest Ref Range: 70 - 99 mg/dL 327 (H) 284 (H) 185 (H)  10 units LANTUS @3:44 191 (H)  2 units NOVOLOG    Results for [name], [name] (MRN [id number]) as of 07/18/2020 08:23  Ref. Range 10/07/2018 10:12 07/18/2020 02:24  Hemoglobin A1C Latest Ref Range: 4.8 - 5.6 % 11.4 (H) 13.5 (H)  (340 mg/dl)    Admit with: Hyperglycemia/ Sepsis secondary to presumptive UTI  History: DM  Home DM Meds: Metformin 1000 mg BID       Invokana 100 mg Daily (NOT taking)  Current Orders: Lantus 10 units Daily      Novolog Sensitive Correction Scale/ SSI (0-9 units) TID AC + HS    No Insurance listed  No PCP listed    MD- Note Lantus started last PM.  CBG 191 this AM.  Please consider:  Increase Lantus to 15 units Daily (0.2 units/kg)  For discharge, may consider one injection of Lantus per day and re-order Metformin (but at lower dose).  Pt admits to issues with diarrhea at higher dose of 1000 mg BID.  Seems to do OK with 1000 mg Daily.    Addendum 9am--Met with pt at bedside this AM.  Spoke with patient about his current A1c of 13.5%.  Explained what an A1c is and what it measures.  Reminded patient that his goal A1c is 7% or less per ADA standards to prevent both acute and long-term complications.  Explained to patient the extreme importance of good glucose control at home.  Encouraged patient to check his CBGs at least bid at home (fasting and another check within the day) and to record all CBGs in a logbook for his PCP to review.  Also reviewed pathophysiology of  diabetes and reviewed the long-term and short-term complications of elevated CBGs.  Pt instructed on signs and symptoms of high and low CBGs and how to treat each.  Also discussed with pt the possibility that he will need insulin for home.  Explained current Insulins we have pt on in the hospital and how to use each at home if needed.  Pt has CBG meter at home.  Pt told me he was only taking 1 tablet of Metformin per day (1000 mg Daily) b/c the 1000 mg BID was giving his diarrhea.  Discussed with pt how insulin works and emphasized the need for consistency with timing of insulin at home.  Educated patient on insulin pen use at home.  Have ordered insulin flexpen starter kit.  Reviewed all steps of insulin pen including attachment of needle, 2-unit air shot, dialing up dose, giving injection, rotation of injection sites, removing needle, disposal of sharps, storage of unused insulin, disposal of insulin etc.  Patient able to provide successful return demonstration.  Reviewed troubleshooting with insulin pen.  Have asked RNs caring for patient to please allow patient to give all injections here in hospital as much as possible for practice.  MD to give patient Rxs for insulin pens and insulin pen needles.         --  Will follow patient during hospitalization--  Jeannine Johnston Fishel RN, MSN, CDE Diabetes Coordinator Inpatient Glycemic Control Team Team Pager: [phone] (8a-5p)       

## 2020-07-18 NOTE — ED Notes (Signed)
Pt provided with hand warmers.

## 2020-07-19 ENCOUNTER — Other Ambulatory Visit (HOSPITAL_COMMUNITY): Payer: Self-pay

## 2020-07-19 DIAGNOSIS — N390 Urinary tract infection, site not specified: Secondary | ICD-10-CM

## 2020-07-19 DIAGNOSIS — E119 Type 2 diabetes mellitus without complications: Secondary | ICD-10-CM

## 2020-07-19 DIAGNOSIS — A401 Sepsis due to streptococcus, group B: Secondary | ICD-10-CM

## 2020-07-19 LAB — CBC
HCT: 40 % (ref 39.0–52.0)
Hemoglobin: 13.7 g/dL (ref 13.0–17.0)
MCH: 28.8 pg (ref 26.0–34.0)
MCHC: 34.3 g/dL (ref 30.0–36.0)
MCV: 84.2 fL (ref 80.0–100.0)
Platelets: 206 10*3/uL (ref 150–400)
RBC: 4.75 MIL/uL (ref 4.22–5.81)
RDW: 12.1 % (ref 11.5–15.5)
WBC: 6.7 10*3/uL (ref 4.0–10.5)
nRBC: 0 % (ref 0.0–0.2)

## 2020-07-19 LAB — URINE CULTURE: Culture: 2000 — AB

## 2020-07-19 LAB — BASIC METABOLIC PANEL
Anion gap: 8 (ref 5–15)
BUN: 9 mg/dL (ref 6–20)
CO2: 20 mmol/L — ABNORMAL LOW (ref 22–32)
Calcium: 8.1 mg/dL — ABNORMAL LOW (ref 8.9–10.3)
Chloride: 101 mmol/L (ref 98–111)
Creatinine, Ser: 0.87 mg/dL (ref 0.61–1.24)
GFR, Estimated: >60 mL/min (ref >60)
Glucose, Bld: 213 mg/dL — ABNORMAL HIGH (ref 70–99)
Potassium: 3.9 mmol/L (ref 3.5–5.1)
Sodium: 129 mmol/L — ABNORMAL LOW (ref 135–145)

## 2020-07-19 LAB — GLUCOSE, CAPILLARY
Glucose-Capillary: 200 mg/dL — ABNORMAL HIGH (ref 70–99)
Glucose-Capillary: 243 mg/dL — ABNORMAL HIGH (ref 70–99)
Glucose-Capillary: 202 mg/dL — ABNORMAL HIGH (ref 70–99)
Glucose-Capillary: 275 mg/dL — ABNORMAL HIGH (ref 70–99)
Glucose-Capillary: 210 mg/dL — ABNORMAL HIGH (ref 70–99)

## 2020-07-19 MED ORDER — ACCU-CHEK SOFTCLIX LANCETS MISC: 5 refills | Status: DC

## 2020-07-19 MED ORDER — PENTIPS 32G X 4 MM MISC
1.0000 | 0 refills | Status: DC | PRN
  Filled 2020-07-19: qty 100, 30d supply, fill #0

## 2020-07-19 MED ORDER — ACCU-CHEK GUIDE VI STRP
ORAL_STRIP | 12 refills | Status: DC
  Filled 2020-07-19: qty 100, 30d supply, fill #0

## 2020-07-19 MED ORDER — CEFDINIR 300 MG PO CAPS: 300.0000 mg | ORAL_CAPSULE | Freq: Two times a day (BID) | ORAL | 0 refills | Status: DC

## 2020-07-19 MED ORDER — ACCU-CHEK SOFTCLIX LANCETS MISC
5 refills | Status: DC
  Filled 2020-07-19: qty 100, 30d supply, fill #0

## 2020-07-19 MED ORDER — ACCU-CHEK GUIDE VI STRP: ORAL_STRIP | 12 refills | Status: DC

## 2020-07-19 MED ORDER — ACCU-CHEK GUIDE W/DEVICE KIT
PACK | 0 refills | Status: DC
  Filled 2020-07-19: qty 1, 30d supply, fill #0

## 2020-07-19 MED ORDER — ACCU-CHEK GUIDE W/DEVICE KIT: PACK | 0 refills | Status: DC

## 2020-07-19 MED ORDER — METFORMIN HCL 1000 MG PO TABS: 1000.0000 mg | ORAL_TABLET | Freq: Every day | ORAL | 3 refills | Status: DC

## 2020-07-19 MED ORDER — CEFDINIR 300 MG PO CAPS
300.0000 mg | ORAL_CAPSULE | Freq: Two times a day (BID) | ORAL | 0 refills | Status: DC
  Filled 2020-07-19: qty 14, 7d supply, fill #0

## 2020-07-19 MED ORDER — INSULIN GLARGINE 100 UNIT/ML ~~LOC~~ SOLN
15.0000 [IU] | Freq: Every day | SUBCUTANEOUS | Status: DC
  Administered 2020-07-19: 15 [IU] via SUBCUTANEOUS
  Filled 2020-07-19: qty 0.15

## 2020-07-19 MED ORDER — INSULIN STARTER KIT- PEN NEEDLES (ENGLISH): 1.0000 | Freq: Once | 0 refills | Status: AC

## 2020-07-19 MED ORDER — INSULIN GLARGINE 100 UNIT/ML SOLOSTAR PEN
15.0000 [IU] | PEN_INJECTOR | Freq: Every day | SUBCUTANEOUS | 3 refills | Status: DC
  Filled 2020-07-19: qty 6, 30d supply, fill #0

## 2020-07-19 MED ORDER — IBUPROFEN 200 MG PO TABS
400.0000 mg | ORAL_TABLET | Freq: Once | ORAL | Status: AC
  Administered 2020-07-19: 400 mg via ORAL
  Filled 2020-07-19: qty 2

## 2020-07-19 MED ORDER — METFORMIN HCL 1000 MG PO TABS
1000.0000 mg | ORAL_TABLET | Freq: Every day | ORAL | 3 refills | Status: DC
  Filled 2020-07-19: qty 30, 30d supply, fill #0

## 2020-07-19 MED ORDER — INSULIN STARTER KIT- PEN NEEDLES (ENGLISH): 1.0000 | Freq: Once | 0 refills | Status: DC

## 2020-07-19 MED ORDER — INSULIN GLARGINE 100 UNIT/ML SOLOSTAR PEN: 15.0000 [IU] | PEN_INJECTOR | Freq: Every day | SUBCUTANEOUS | 3 refills | Status: DC

## 2020-07-19 NOTE — TOC Transition Note (Signed)
Transition of Care [facility]) - CM/SW Discharge Note   Patient Details  Name: [name] [name] MRN: [id number] Date of Birth: 12-09-1970  Transition of [facility] CM/SW Contact:  Lockie Pares, RN Phone Number: 07/19/2020, 3:53 PM   Clinical Narrative:     MATCH done for patient, he will receive scripts from [facility] pharmacy. Will DC home after, no furhter needs assessed    Barriers to Discharge: Continued Medical Work up   Patient Goals and CMS Choice        Discharge Placement             Home self care          Discharge Plan and Services   Discharge Planning Services: MATCH Program, CM Consult, Medication Assistance                                 Social Determinants of Health (SDOH) Interventions     Readmission Risk Interventions No flowsheet data found.

## 2020-07-19 NOTE — Discharge Summary (Addendum)
Physician Discharge Summary  [name] [name] CWC:[id number] DOB: [birth year] DOA: 07/17/2020  PCP: Pcp, No  Admit date: 07/17/2020 Discharge date: 07/19/2020  Admitted From: Home  Disposition:  Home   Recommendations for Outpatient Follow-up:  Follow up with new PCP as scheduled [facility]: Please obtain BMP in 1 week Please review blood sugar log and adjust insulin and metformin as needed Please check A1c in 3 months        Home Health: None  Equipment/Devices: None new  Discharge Condition: Good  CODE STATUS: FULL Diet recommendation: Diabetic  Brief/Interim Summary: [name] [name] is a 50 y.o. M with uncontrolled DM who presented with fever, hyperglycemia, and low back pain over the last week, now worsening as well as chills and malaise.  In the ER, he was febrile to 101, tachycardic and tachypneic.  Urinalysis was suggestive of fever and he was started on empiric ceftriaxone and admitted.     PRINCIPAL HOSPITAL DIAGNOSIS: Sepsis due to UTI    Discharge Diagnoses:   Sepsis due to UTI Patient presented with generalized malaise, decreased mentation, T-max 101, tachycardia and pyuria.  CT renal showed no hydronephrosis or stone.  CT showed no other intraabdominal infection, abscess or bony infection.  Treated with fluids and antibiotics and fever curve improved.  Able to take PO.  HR and RR normal, BP normal, mentation good.  Urine growing GBS only.  Discharged to complete 7 more days cefdinir.       Type 2 diabetes, poorly controlled, with hyperglycemia A1c 13%, he takes no medicines at home, does not have a PCP  Arranged for new PCP.  Arranged MATCH program for medication assistance.  Discharged with Lantus, metformin.    Hyponatremia Mild, asympatomic.      Hand and foot pain Thigh cramps Has bilateral hand cramping and weak grip.  On exam, the grip is moderate, maybe slightly diminished, fairly close to normal.  The hands and joints are normal.  The hand pain is  most of the hand, radiating down the fingers.  The foot pain is mostly in the balls of the feet, a "hypersensitive pain" which sounds neuropathic.  The thigh cramps come and go, and are not constant.  The urine didn't have a mismatch between dipstick Hgb and micoscopic RBCs, so I do not suspect rhabdo.      Discharge Instructions  Discharge Instructions     Discharge instructions   Complete by: As directed    From Dr. Loleta Books: You were admitted for a kidney infection You should take the antibiotic cefdinir/Omnicef 300 mg twice adily (once at night and once in the morning) for the next week  Go see the new primary care at [facility] as directed in the instructions below  Check your sugar daily in the morning and write it down They will look over your sugar recordings (bring the sugars you copy down with you) and adjust your medicines as needed  Resume metformin Take metformin 500 mg (half tab) once daily for the first week, then increase to 1000 mg once daily after that.  You can do more, the usual dose is 1000 mg twice daily, but you just have to do it gradually to avoid diarrhea.   Increase activity slowly   Complete by: As directed       Allergies as of 07/19/2020   No Known Allergies      Medication List     STOP taking these medications    acetaminophen 500 MG tablet Commonly known as:  TYLENOL   canagliflozin 100 MG Tabs tablet Commonly known as: INVOKANA   ondansetron 4 MG tablet Commonly known as: ZOFRAN       TAKE these medications    Accu-Chek Guide test strip Generic drug: glucose blood Use as instructed   Accu-Chek Guide w/Device Kit Check blood sugar once daily in the morning before taking Lantus   Accu-Chek Softclix Lancets lancets Use as directed.   cefdinir 300 MG capsule Commonly known as: OMNICEF Take 1 capsule (300 mg total) by mouth 2 (two) times daily.   diphenhydrAMINE 25 MG tablet Commonly known as: BENADRYL Take 50 mg by mouth  every 6 (six) hours as needed for allergies.   ibuprofen 200 MG tablet Commonly known as: ADVIL Take 400 mg by mouth every 6 (six) hours as needed for fever or moderate pain.   insulin glargine 100 UNIT/ML Solostar Pen Commonly known as: LANTUS Inject 15 Units into the skin daily.   insulin starter kit- pen needles Misc 1 kit by Other route once for 1 dose.   metFORMIN 1000 MG tablet Commonly known as: GLUCOPHAGE Take 1 tablet (1,000 mg total) by mouth daily with breakfast. What changed: when to take this        Follow-up Information     Primary Care at [facility] Follow up on 07/31/2020.   Specialty: Family Medicine Why: 7078 am PLEASE MAKE SURE YOU GO FOR FOLLOWUP APPT AND TO ESTABLISH A PRIMARY CARE DOCTOR. IF YOU CAN NOT MAKE THIS APPT, PLEASE CALL TO RESCHEDULE  THANK YOU Contact information: [address] [place] [address] [phone]               No Known Allergies    Procedures/Studies: DG Chest 1 View  Result Date: 07/17/2020 CLINICAL DATA:  Chest pain EXAM: CHEST  1 VIEW COMPARISON:  09/09/2019 FINDINGS: The heart size and mediastinal contours are within normal limits. Both lungs are clear. The visualized skeletal structures are unremarkable. IMPRESSION: No active disease. Electronically Signed   By: Donavan Foil M.D.   On: 07/17/2020 18:52   CT Renal Stone Study  Result Date: 07/17/2020 CLINICAL DATA:  Flank pain. Laterality is not indicated. High blood sugar and generalized aches and pains. EXAM: CT ABDOMEN AND PELVIS WITHOUT CONTRAST TECHNIQUE: Multidetector CT imaging of the abdomen and pelvis was performed following the standard protocol without IV contrast. COMPARISON:  None. FINDINGS: Lower chest: Lung bases are clear. Hepatobiliary: No focal liver abnormality is seen. No gallstones, gallbladder wall thickening, or biliary dilatation. Pancreas: Unremarkable. No pancreatic ductal dilatation or surrounding inflammatory  changes. Spleen: Normal in size without focal abnormality. Adrenals/Urinary Tract: Adrenal glands are unremarkable. Kidneys are normal, without renal calculi, focal lesion, or hydronephrosis. Bladder is unremarkable. Stomach/Bowel: Stomach, small bowel, and colon are not abnormally distended. Stool fills the colon. No wall thickening or inflammatory changes are appreciated. Appendix is normal. Vascular/Lymphatic: No significant vascular findings are present. No enlarged abdominal or pelvic lymph nodes. Reproductive: Prostate is unremarkable. Other: No abdominal wall hernia or abnormality. No abdominopelvic ascites. Musculoskeletal: No acute or significant osseous findings. IMPRESSION: No renal or ureteral stone or obstruction. Electronically Signed   By: Lucienne Capers M.D.   On: 07/17/2020 21:59      Subjective: Still with some cramps and fatigue but imrpving.  Walking in room independently. Appetite better.  No confusion, abdominal pain.  Flank pain improving.  No CV tenderness.  Mild fever, overall curve improving.  Discharge Exam: Vitals:  07/19/20 0730 07/19/20 1147  BP: 108/66 135/79  Pulse: 85 75  Resp: 17 14  Temp: (!) 100.7 F (38.2 C) 98.4 F (36.9 C)  SpO2: 93% 100%   Vitals:   07/19/20 0044 07/19/20 0501 07/19/20 0730 07/19/20 1147  BP: 118/70 129/73 108/66 135/79  Pulse: 89 90 85 75  Resp: 18 17 17 14   Temp: 99.3 F (37.4 C) 99.7 F (37.6 C) (!) 100.7 F (38.2 C) 98.4 F (36.9 C)  TempSrc: Oral Oral Oral Oral  SpO2: 95% 94% 93% 100%  Weight:      Height:        General: Pt is alert, awake, not in acute distress Cardiovascular: RRR, nl S1-S2, no murmurs appreciated.   No LE edema.   Respiratory: Normal respiratory rate and rhythm.  CTAB without rales or wheezes. Abdominal: Abdomen soft and non-tender.  No distension or HSM.   Neuro/Psych: Strength symmetric in upper and lower extremities.  Judgment and insight appear normal.   The results of significant  diagnostics from this hospitalization (including imaging, microbiology, ancillary and laboratory) are listed below for reference.     Microbiology: Recent Results (from the past 240 hour(s))  Urine culture     Status: Abnormal   Collection Time: 07/17/20  5:41 PM   Specimen: In/Out Cath Urine  Result Value Ref Range Status   Specimen Description IN/OUT CATH URINE  Final   Special Requests NONE  Final   Culture (A)  Final    2,000 COLONIES/mL GROUP B STREP(S.AGALACTIAE)ISOLATED TESTING AGAINST S. AGALACTIAE NOT ROUTINELY PERFORMED DUE TO PREDICTABILITY OF AMP/PEN/VAN SUSCEPTIBILITY. Performed at [facility], [place] [address] [place], [place] [address]    Report Status 07/19/2020 FINAL  Final  Resp Panel by RT-PCR (Flu A&B, Covid) Nasopharyngeal Swab     Status: None   Collection Time: 07/17/20  6:49 PM   Specimen: Nasopharyngeal Swab; Nasopharyngeal(NP) swabs in vial transport medium  Result Value Ref Range Status   SARS Coronavirus 2 by RT PCR NEGATIVE NEGATIVE Final    Comment: (NOTE) SARS-CoV-2 target nucleic acids are NOT DETECTED.  The SARS-CoV-2 RNA is generally detectable in upper respiratory specimens during the acute phase of infection. The lowest concentration of SARS-CoV-2 viral copies this assay can detect is 138 copies/mL. A negative result does not preclude SARS-Cov-2 infection and should not be used as the sole basis for treatment or other patient management decisions. A negative result may occur with  improper specimen collection/handling, submission of specimen other than nasopharyngeal swab, presence of viral mutation(s) within the areas targeted by this assay, and inadequate number of viral copies(<138 copies/mL). A negative result must be combined with clinical observations, patient history, and epidemiological information. The expected result is Negative.  Fact Sheet for Patients:  EntrepreneurPulse.com.au  Fact Sheet for Healthcare  Providers:  IncredibleEmployment.be  This test is no t yet approved or cleared by the [place] FDA and  has been authorized for detection and/or diagnosis of SARS-CoV-2 by FDA under an Emergency Use Authorization (EUA). This EUA will remain  in effect (meaning this test can be used) for the duration of the COVID-19 declaration under Section 564(b)(1) of the Act, 21 U.S.C.section 360bbb-3(b)(1), unless the authorization is terminated  or revoked sooner.       Influenza A by PCR NEGATIVE NEGATIVE Final   Influenza B by PCR NEGATIVE NEGATIVE Final    Comment: (NOTE) The Xpert Xpress SARS-CoV-2/FLU/RSV plus assay is intended as an aid in the diagnosis of  influenza from Nasopharyngeal swab specimens and should not be used as a sole basis for treatment. Nasal washings and aspirates are unacceptable for Xpert Xpress SARS-CoV-2/FLU/RSV testing.  Fact Sheet for Patients: EntrepreneurPulse.com.au  Fact Sheet for Healthcare Providers: IncredibleEmployment.be  This test is not yet approved or cleared by the [place] FDA and has been authorized for detection and/or diagnosis of SARS-CoV-2 by FDA under an Emergency Use Authorization (EUA). This EUA will remain in effect (meaning this test can be used) for the duration of the COVID-19 declaration under Section 564(b)(1) of the Act, 21 U.S.C. section 360bbb-3(b)(1), unless the authorization is terminated or revoked.  Performed at [facility], [place] [address], [place], [place] [address]   Blood Culture (routine x 2)     Status: None (Preliminary result)   Collection Time: 07/17/20  8:45 PM   Specimen: BLOOD  Result Value Ref Range Status   Specimen Description BLOOD RIGHT ARM  Final   Special Requests   Final    BOTTLES DRAWN AEROBIC AND ANAEROBIC Blood Culture adequate volume   Culture   Final    NO GROWTH 2 DAYS Performed at [facility], [place] [address],  [place], [place] [address]    Report Status PENDING  Incomplete  Blood Culture (routine x 2)     Status: None (Preliminary result)   Collection Time: 07/17/20  8:50 PM   Specimen: BLOOD RIGHT HAND  Result Value Ref Range Status   Specimen Description BLOOD RIGHT HAND  Final   Special Requests   Final    BOTTLES DRAWN AEROBIC AND ANAEROBIC Blood Culture adequate volume   Culture   Final    NO GROWTH 2 DAYS Performed at [facility], [place] [address], [place], [place] [address]    Report Status PENDING  Incomplete     Labs: BNP (last 3 results) No results for input(s): BNP in the last 8760 hours. Basic Metabolic Panel: Recent Labs  Lab 07/17/20 1741 07/18/20 0021 07/18/20 0224 07/19/20 0331  NA 126*  --  132* 129*  K 4.3  --  3.9 3.9  CL 93*  --  102 101  CO2 19*  --  20* 20*  GLUCOSE 281*  --  195* 213*  BUN 13  --  14 9  CREATININE 1.10 0.99 0.97 0.87  CALCIUM 9.3  --  8.2* 8.1*   Liver Function Tests: Recent Labs  Lab 07/17/20 1741 07/18/20 0224  AST 44* 39  ALT 47* 42  ALKPHOS 70 60  BILITOT 1.4* 0.8  PROT 7.8 6.7  ALBUMIN 3.8 3.3*   No results for input(s): LIPASE, AMYLASE in the last 168 hours. No results for input(s): AMMONIA in the last 168 hours. CBC: Recent Labs  Lab 07/17/20 1741 07/18/20 0021 07/18/20 0224 07/19/20 0331  WBC 6.4 6.9 6.4 6.7  NEUTROABS 4.3  --   --   --   HGB 16.9 16.1 16.0 13.7  HCT 48.1 46.5 45.7 40.0  MCV 83.9 85.5 83.7 84.2  PLT 201 184 181 206   Cardiac Enzymes: No results for input(s): CKTOTAL, CKMB, CKMBINDEX, TROPONINI in the last 168 hours. BNP: Invalid input(s): POCBNP CBG: Recent Labs  Lab 07/18/20 2251 07/19/20 0500 07/19/20 0849 07/19/20 1131 07/19/20 1423  GLUCAP 297* 200* 243* 202* 275*   D-Dimer No results for input(s): DDIMER in the last 72 hours. Hgb A1c Recent Labs    07/18/20 0224  HGBA1C 13.5*   Lipid Profile Recent Labs  07/18/20 0224  CHOL 164  HDL 26*  LDLCALC 110*  TRIG 140   CHOLHDL 6.3   Thyroid function studies No results for input(s): TSH, T4TOTAL, T3FREE, THYROIDAB in the last 72 hours.  Invalid input(s): FREET3 Anemia work up No results for input(s): VITAMINB12, FOLATE, FERRITIN, TIBC, IRON, RETICCTPCT in the last 72 hours. Urinalysis    Component Value Date/Time   COLORURINE YELLOW 07/17/2020 1741   APPEARANCEUR HAZY (A) 07/17/2020 1741   LABSPEC 1.036 (H) 07/17/2020 1741   PHURINE 5.0 07/17/2020 1741   GLUCOSEU >=500 (A) 07/17/2020 1741   HGBUR SMALL (A) 07/17/2020 1741   BILIRUBINUR NEGATIVE 07/17/2020 1741   KETONESUR 80 (A) 07/17/2020 1741   PROTEINUR 100 (A) 07/17/2020 1741   NITRITE NEGATIVE 07/17/2020 1741   LEUKOCYTESUR NEGATIVE 07/17/2020 1741   Sepsis Labs Invalid input(s): PROCALCITONIN,  WBC,  LACTICIDVEN Microbiology Recent Results (from the past 240 hour(s))  Urine culture     Status: Abnormal   Collection Time: 07/17/20  5:41 PM   Specimen: In/Out Cath Urine  Result Value Ref Range Status   Specimen Description IN/OUT CATH URINE  Final   Special Requests NONE  Final   Culture (A)  Final    2,000 COLONIES/mL GROUP B STREP(S.AGALACTIAE)ISOLATED TESTING AGAINST S. AGALACTIAE NOT ROUTINELY PERFORMED DUE TO PREDICTABILITY OF AMP/PEN/VAN SUSCEPTIBILITY. Performed at [facility], [place] [address], [place], [place] [address]    Report Status 07/19/2020 FINAL  Final  Resp Panel by RT-PCR (Flu A&B, Covid) Nasopharyngeal Swab     Status: None   Collection Time: 07/17/20  6:49 PM   Specimen: Nasopharyngeal Swab; Nasopharyngeal(NP) swabs in vial transport medium  Result Value Ref Range Status   SARS Coronavirus 2 by RT PCR NEGATIVE NEGATIVE Final    Comment: (NOTE) SARS-CoV-2 target nucleic acids are NOT DETECTED.  The SARS-CoV-2 RNA is generally detectable in upper respiratory specimens during the acute phase of infection. The lowest concentration of SARS-CoV-2 viral copies this assay can detect is 138 copies/mL. A  negative result does not preclude SARS-Cov-2 infection and should not be used as the sole basis for treatment or other patient management decisions. A negative result may occur with  improper specimen collection/handling, submission of specimen other than nasopharyngeal swab, presence of viral mutation(s) within the areas targeted by this assay, and inadequate number of viral copies(<138 copies/mL). A negative result must be combined with clinical observations, patient history, and epidemiological information. The expected result is Negative.  Fact Sheet for Patients:  EntrepreneurPulse.com.au  Fact Sheet for Healthcare Providers:  IncredibleEmployment.be  This test is no t yet approved or cleared by the [place] FDA and  has been authorized for detection and/or diagnosis of SARS-CoV-2 by FDA under an Emergency Use Authorization (EUA). This EUA will remain  in effect (meaning this test can be used) for the duration of the COVID-19 declaration under Section 564(b)(1) of the Act, 21 U.S.C.section 360bbb-3(b)(1), unless the authorization is terminated  or revoked sooner.       Influenza A by PCR NEGATIVE NEGATIVE Final   Influenza B by PCR NEGATIVE NEGATIVE Final    Comment: (NOTE) The Xpert Xpress SARS-CoV-2/FLU/RSV plus assay is intended as an aid in the diagnosis of influenza from Nasopharyngeal swab specimens and should not be used as a sole basis for treatment. Nasal washings and aspirates are unacceptable for Xpert Xpress SARS-CoV-2/FLU/RSV testing.  Fact Sheet for Patients: EntrepreneurPulse.com.au  Fact Sheet for Healthcare Providers: IncredibleEmployment.be  This test is not yet approved  or cleared by the [place] and has been authorized for detection and/or diagnosis of SARS-CoV-2 by FDA under an Emergency Use Authorization (EUA). This EUA will remain in effect (meaning this test can  be used) for the duration of the COVID-19 declaration under Section 564(b)(1) of the Act, 21 U.S.C. section 360bbb-3(b)(1), unless the authorization is terminated or revoked.  Performed at [facility], [place] [address], [place], [place] [address]   Blood Culture (routine x 2)     Status: None (Preliminary result)   Collection Time: 07/17/20  8:45 PM   Specimen: BLOOD  Result Value Ref Range Status   Specimen Description BLOOD RIGHT ARM  Final   Special Requests   Final    BOTTLES DRAWN AEROBIC AND ANAEROBIC Blood Culture adequate volume   Culture   Final    NO GROWTH 2 DAYS Performed at [facility], [place] [address], [place], [place] [address]    Report Status PENDING  Incomplete  Blood Culture (routine x 2)     Status: None (Preliminary result)   Collection Time: 07/17/20  8:50 PM   Specimen: BLOOD RIGHT HAND  Result Value Ref Range Status   Specimen Description BLOOD RIGHT HAND  Final   Special Requests   Final    BOTTLES DRAWN AEROBIC AND ANAEROBIC Blood Culture adequate volume   Culture   Final    NO GROWTH 2 DAYS Performed at [facility], [place] [address], [place], [place] [address]    Report Status PENDING  Incomplete     Time coordinating discharge: 35 minutes The [place] controlled substances registry was reviewed for this patient      30 Day Unplanned Readmission Risk Score    Flowsheet Row ED to Hosp-Admission (Current) from 07/17/2020 in [place]  30 Day Unplanned Readmission Risk Score (%) 7.15 Filed at 07/19/2020 1200       This score is the patient's risk of an unplanned readmission within 30 days of being discharged (0 -100%). The score is based on dignosis, age, lab data, medications, orders, and past utilization.   Low:  0-14.9   Medium: 15-21.9   High: 22-29.9   Extreme: 30 and above             SIGNED:   Edwin Dada, MD  [facility] 07/19/2020, 3:48 PM

## 2020-07-19 NOTE — Final Progress Note (Signed)
Patient was discharged home with significant other. Patient was given discharge instruction along with medications from pharmacy. All medication and discharge teaching was given. Patient verbalizes he understands. Patient left alert in stable condition.

## 2020-07-19 NOTE — Plan of Care (Signed)
  RD consulted for nutrition education regarding diabetes.   Lab Results  Component Value Date   HGBA1C 13.5 (H) 07/18/2020   Spoke with pt at bedside, who was pleasant and in good spirits today. He reports that he was diagnosed with DM PTA but admits to fluctuating control. He admits to dietary indiscretion, often eating foods like pizza and fried chicken. Per pt, he does a lot of cooking at home and intends to eat oatmeal for breakfast and baked chicken/ fish and vegetables for dinner. Pt reports he recently started eating vegetables and is pleasantly surprised that he likes several of them.   Beverages are mainly water and zero sugar juice. RD empowered pt to make changes to his diet and continue to reduce sugar sweetened beverages in his diet. Praised pt for making meals at home. He is contemplating being more active, including resuming mountain biking; encouraged pt to discuss physical activity restrictions with MD and discussed benefits of regular physical activity on glycemic control.   Pt feels comfortable with insulin administration, as he has been practicing during admission. Reviewed importance of rotating injection sites. Pt reports that he is hopeful to come off insulin in the future, as he has a cousin who was able to in the past. RD discussed importance of self-management for DM to help prevent further complications, including regular follow-up at PCP. RD also discussed importance of foot care. He is committed to checking his feet daily.    RD provided "Carbohydrate Counting for People with Diabetes" handout from the Academy of Nutrition and Dietetics. Discussed different food groups and their effects on blood sugar, emphasizing carbohydrate-containing foods. Provided list of carbohydrates and recommended serving sizes of common foods.  Discussed importance of controlled and consistent carbohydrate intake throughout the day. Provided examples of ways to balance meals/snacks and  encouraged intake of high-fiber, whole grain complex carbohydrates. Teach back method used.  Expect fair to good compliance.  Current diet order is carb modified, patient is consuming approximately 100% of meals at this time. Labs and medications reviewed. No further nutrition interventions warranted at this time. RD contact information provided. If additional nutrition issues arise, please re-consult RD.  Levada Schilling, RD, LDN, CDCES Registered Dietitian II Certified Diabetes Care and Education Specialist Please refer to [facility] for RD and/or RD on-call/weekend/after hours pager

## 2020-07-19 NOTE — Progress Notes (Addendum)
Inpatient Diabetes Program Recommendations  AACE/ADA: New Consensus Statement on Inpatient Glycemic Control (2015)  Target Ranges:  Prepandial:   less than 140 mg/dL      Peak postprandial:   less than 180 mg/dL (1-2 hours)      Critically ill patients:  140 - 180 mg/dL   Results for [name] [name], [name] [name] (MRN [id number]) as of 07/19/2020 07:37  Ref. Range 07/18/2020 07:53 07/18/2020 11:49 07/18/2020 16:36 07/18/2020 22:51  Glucose-Capillary Latest Ref Range: 70 - 99 mg/dL  10 units LANTUS _0 :44am 191 (H)  2 units NOVOLOG  217 (H)  3 units NOVOLOG  253 (H)  5 units NOVOLOG  297 (H)  3 units NOVOLOG    Results for [name] [name], [name] [name] (MRN [id number]) as of 07/19/2020 07:37  Ref. Range 07/19/2020 05:00  Glucose-Capillary Latest Ref Range: 70 - 99 mg/dL 200 (H)    Admit with: Hyperglycemia/ Sepsis secondary to presumptive UTI   History: DM   Home DM Meds: Metformin 1000 mg BID                             Invokana 100 mg Daily (NOT taking)   Current Orders: Lantus 10 units Daily                            Novolog Moderate Correction Scale/ SSI (0-15 units) TID AC + HS      MD- Note Lantus started early yesterday AM.  CBG 200 this AM.   Please consider:   Increase Lantus to 15 units Daily (0.2 units/kg)   For discharge, may consider one injection of Lantus per day and re-order Metformin (but at lower dose).  Pt admits to issues with diarrhea at higher dose of 1000 mg BID.  Seems to do OK with 1000 mg Daily.    Addendum 11:50am--Met w/ pt again at bedside.  Reviewed conversation we had yesterday.  Discussed with pt that Dr. Loleta Books will be discharging him home later today with long-acting insulin (likely once per day) + Metformin.  Discussed with pt both the Insulin and Metformin (how they work, side effects, how to take, etc).  Reinforced to pt the importance of being consistent with the timing of the Insulin.  Pt stated he would prefer the Insulin Pens and said he felt comfortable using the pens  as we reviewed them yesterday.  Has been successfully giving insulin here in the hospital per nursing staff.  Has CBG meter at home and encouraged pt to start checking CBGs at home at least BID to start (TID before meals would be best).  Encouraged pt to keep his follow up appt with the [facility] at [facility] on 07/31/2020.  Pt should be able to get Rxs refilled there for low cost if he keeps and maintains visits there.  Also reviewed basic DM diet info with pt (have also ordered RD consult prior to d/c and have attached informational diabetes diet educational info to AVS).  Encouraged patient to avoid beverages with sugar (regular soda, sweet tea, lemonade, fruit juice) and to consume mostly water.  Discussed what foods contain carbohydrates and how carbohydrates affect the body's blood sugar levels.  Encouraged patient to be careful with his portion sizes (especially grains, starchy vegetables, and fruits).      --Will follow patient during hospitalization--  Wyn Quaker RN, MSN, CDE Diabetes Coordinator Inpatient Glycemic Control Team Team Pager:  [phone] (8a-5p)

## 2020-07-22 LAB — CULTURE, BLOOD (ROUTINE X 2)
Culture: NO GROWTH
Culture: NO GROWTH
Special Requests: ADEQUATE
Special Requests: ADEQUATE
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
## 2020-07-31 ENCOUNTER — Other Ambulatory Visit: Payer: Self-pay

## 2020-07-31 ENCOUNTER — Ambulatory Visit (INDEPENDENT_AMBULATORY_CARE_PROVIDER_SITE_OTHER): Payer: Self-pay | Admitting: Family

## 2020-07-31 ENCOUNTER — Encounter: Payer: Self-pay | Admitting: Family

## 2020-07-31 VITALS — BP 107/67 | HR 72 | Temp 98.4°F | Resp 16 | Ht 68.5 in | Wt 163.4 lb

## 2020-07-31 DIAGNOSIS — Z599 Problem related to housing and economic circumstances, unspecified: Secondary | ICD-10-CM

## 2020-07-31 DIAGNOSIS — Z7689 Persons encountering health services in other specified circumstances: Secondary | ICD-10-CM

## 2020-07-31 DIAGNOSIS — E1165 Type 2 diabetes mellitus with hyperglycemia: Secondary | ICD-10-CM

## 2020-07-31 DIAGNOSIS — M79643 Pain in unspecified hand: Secondary | ICD-10-CM

## 2020-07-31 DIAGNOSIS — Z09 Encounter for follow-up examination after completed treatment for conditions other than malignant neoplasm: Secondary | ICD-10-CM

## 2020-07-31 DIAGNOSIS — F32A Depression, unspecified: Secondary | ICD-10-CM

## 2020-07-31 DIAGNOSIS — A401 Sepsis due to streptococcus, group B: Secondary | ICD-10-CM

## 2020-07-31 DIAGNOSIS — R252 Cramp and spasm: Secondary | ICD-10-CM

## 2020-07-31 DIAGNOSIS — E1141 Type 2 diabetes mellitus with diabetic mononeuropathy: Secondary | ICD-10-CM

## 2020-07-31 DIAGNOSIS — Z794 Long term (current) use of insulin: Secondary | ICD-10-CM

## 2020-07-31 DIAGNOSIS — E871 Hypo-osmolality and hyponatremia: Secondary | ICD-10-CM

## 2020-07-31 DIAGNOSIS — M79673 Pain in unspecified foot: Secondary | ICD-10-CM

## 2020-07-31 DIAGNOSIS — F419 Anxiety disorder, unspecified: Secondary | ICD-10-CM

## 2020-07-31 MED ORDER — GABAPENTIN 100 MG PO CAPS
100.0000 mg | ORAL_CAPSULE | Freq: Three times a day (TID) | ORAL | 0 refills | Status: DC
  Filled 2020-07-31 – 2020-08-14 (×2): qty 90, 30d supply, fill #0

## 2020-07-31 MED ORDER — SERTRALINE HCL 25 MG PO TABS
25.0000 mg | ORAL_TABLET | Freq: Every day | ORAL | 0 refills | Status: DC
  Filled 2020-07-31 – 2020-08-14 (×2): qty 30, 30d supply, fill #0

## 2020-07-31 NOTE — Progress Notes (Signed)
Pt presents to establish care and hospital follow-up pt has been experiencing fatigue and tingling in feet

## 2020-08-01 LAB — BASIC METABOLIC PANEL
BUN/Creatinine Ratio: 12 (ref 9–20)
BUN: 10 mg/dL (ref 6–24)
CO2: 22 mmol/L (ref 20–29)
Calcium: 9.3 mg/dL (ref 8.7–10.2)
Chloride: 100 mmol/L (ref 96–106)
Creatinine, Ser: 0.83 mg/dL (ref 0.76–1.27)
Glucose: 229 mg/dL — ABNORMAL HIGH (ref 65–99)
Potassium: 4.7 mmol/L (ref 3.5–5.2)
Sodium: 138 mmol/L (ref 134–144)
eGFR: 107 mL/min/{1.73_m2} (ref >59)

## 2020-08-01 NOTE — Progress Notes (Signed)
Please call patient with update.   Kidney function normal.

## 2020-08-05 ENCOUNTER — Telehealth: Payer: Self-pay | Admitting: Family

## 2020-08-05 NOTE — Telephone Encounter (Signed)
It is not noted on the 07/31/2020 encounter note with me that disability paperwork was mentioned. However, if we can get a copy of the requested documentation I will review and complete as deemed medically appropriate.

## 2020-08-05 NOTE — Telephone Encounter (Signed)
Pt calls asking for a letter to obtain Disability. Pt states he needs PCP to state he's a Type 2 Diabetic pt with Back pain and per pt, he "cannot work." Pt states he spoke w/ PCP on last appt about this. Please advise and thank you

## 2020-08-06 ENCOUNTER — Other Ambulatory Visit: Payer: Self-pay

## 2020-08-06 ENCOUNTER — Ambulatory Visit: Payer: Medicaid Other | Attending: Family

## 2020-08-07 ENCOUNTER — Other Ambulatory Visit: Payer: Self-pay

## 2020-08-07 ENCOUNTER — Ambulatory Visit (HOSPITAL_COMMUNITY)
Admission: EM | Admit: 2020-08-07 | Discharge: 2020-08-07 | Discharge status: Against Medical Advice | Payer: No Payment, Other | Attending: Psychiatry | Admitting: Psychiatry

## 2020-08-07 ENCOUNTER — Ambulatory Visit (HOSPITAL_COMMUNITY)
Admission: EM | Admit: 2020-08-07 | Discharge: 2020-08-07 | Disposition: A | Discharge status: Home / Self Care | Payer: No Payment, Other | Attending: Psychiatry | Admitting: Psychiatry

## 2020-08-07 DIAGNOSIS — Z603 Acculturation difficulty: Secondary | ICD-10-CM | POA: Insufficient documentation

## 2020-08-07 DIAGNOSIS — F39 Unspecified mood [affective] disorder: Secondary | ICD-10-CM | POA: Insufficient documentation

## 2020-08-07 DIAGNOSIS — Z560 Unemployment, unspecified: Secondary | ICD-10-CM | POA: Insufficient documentation

## 2020-08-07 DIAGNOSIS — Z733 Stress, not elsewhere classified: Secondary | ICD-10-CM | POA: Insufficient documentation

## 2020-08-07 DIAGNOSIS — E119 Type 2 diabetes mellitus without complications: Secondary | ICD-10-CM | POA: Insufficient documentation

## 2020-08-07 DIAGNOSIS — Z62898 Other specified problems related to upbringing: Secondary | ICD-10-CM | POA: Insufficient documentation

## 2020-08-07 DIAGNOSIS — Z634 Disappearance and death of family member: Secondary | ICD-10-CM | POA: Insufficient documentation

## 2020-08-07 DIAGNOSIS — Z597 Insufficient social insurance and welfare support: Secondary | ICD-10-CM | POA: Insufficient documentation

## 2020-08-07 DIAGNOSIS — F4310 Post-traumatic stress disorder, unspecified: Secondary | ICD-10-CM | POA: Insufficient documentation

## 2020-08-07 DIAGNOSIS — Z596 Low income: Secondary | ICD-10-CM | POA: Insufficient documentation

## 2020-08-07 NOTE — ED Provider Notes (Signed)
Attempted to see patient, but was informed by staff that he had left the lobby. Patient left lobby prior to being seen by a provider

## 2020-08-07 NOTE — ED Notes (Signed)
Pt discharged in no acute distress. Denied SI/HI/AVH. Verbalized understanding of instructions reviewed on AVS. Safety maintained.

## 2020-08-07 NOTE — ED Notes (Signed)
Pt left after being triaged but before being seen by provider. MD made aware.

## 2020-08-07 NOTE — Discharge Instructions (Addendum)
   Please come to [facility] (this facility) during walk in hours for appointment with psychiatrist for further medication management and for therapists for therapy.    Walk in hours are 8-11 AM Monday through Thursday for medication management. Therapy walk in hours are Monday-Wednesday 8 AM-1PM.   It is first come, first -serve; it is best to arrive by 7:00 AM.   On Friday from 1 pm to 4 pm for therapy intake only. Please arrive by 12:00 pm as it is  first come, first -serve.    When you arrive please go upstairs for your appointment. If you are unsure of where to go, inform the front desk that you are here for a walk in appointment and they will assist you with directions upstairs.  Address:  [address], in [place], [address] Ph: [phone]   
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
## 2020-08-13 ENCOUNTER — Telehealth: Payer: Self-pay | Admitting: Clinical

## 2020-08-13 NOTE — Telephone Encounter (Signed)
Contacted pt and scheduled appt per request from provider for 08/20/20 at 2:30pm.

## 2020-08-14 ENCOUNTER — Other Ambulatory Visit: Payer: Self-pay

## 2020-08-14 ENCOUNTER — Telehealth: Payer: Self-pay | Admitting: Family

## 2020-08-14 NOTE — Telephone Encounter (Signed)
Mr. Marlette called and stated he needed a refill of    insulin glargine (LANTUS) 100 UNIT/ML Solostar Pen   Dose: 15 Units Route: Subcutaneous Frequency: Daily  Dispense Quantity: 15 mL      Mr. Haigler stated Amy advised him to up the dose to 20 units during their visit on 07/31/20.  Mr. Goyne stated he was informed that Amy would need to call the medication in since she is his primary provider now.  Mr. Hagemann is requesting the medication be sent over to the [facility] if possible. Thaks

## 2020-08-15 ENCOUNTER — Other Ambulatory Visit: Payer: Self-pay

## 2020-08-15 DIAGNOSIS — Z794 Long term (current) use of insulin: Secondary | ICD-10-CM

## 2020-08-15 DIAGNOSIS — E1165 Type 2 diabetes mellitus with hyperglycemia: Secondary | ICD-10-CM

## 2020-08-15 MED ORDER — INSULIN GLARGINE 100 UNIT/ML SOLOSTAR PEN
20.0000 [IU] | PEN_INJECTOR | Freq: Every day | SUBCUTANEOUS | 1 refills | Status: DC
  Filled 2020-08-15: qty 6, 30d supply, fill #0
  Filled 2020-10-01 – 2020-10-17 (×2): qty 6, 30d supply, fill #1

## 2020-08-15 NOTE — Progress Notes (Signed)
Lantus rx updated to 20 units daily injection

## 2020-08-16 ENCOUNTER — Other Ambulatory Visit: Payer: Self-pay

## 2020-08-20 ENCOUNTER — Ambulatory Visit (INDEPENDENT_AMBULATORY_CARE_PROVIDER_SITE_OTHER): Payer: Self-pay | Admitting: Clinical

## 2020-08-20 ENCOUNTER — Other Ambulatory Visit: Payer: Self-pay

## 2020-08-20 DIAGNOSIS — F411 Generalized anxiety disorder: Secondary | ICD-10-CM

## 2020-08-20 DIAGNOSIS — F331 Major depressive disorder, recurrent, moderate: Secondary | ICD-10-CM

## 2020-08-20 NOTE — Telephone Encounter (Signed)
Spoke with patient and he states when he gets paperwork from disability he will bring it in.

## 2020-08-20 NOTE — Telephone Encounter (Signed)
Called patient to have him bring the paperwork to his lab visit , unable to reach patient or lvm.
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
## 2020-09-03 ENCOUNTER — Ambulatory Visit: Payer: Medicaid Other | Admitting: Clinical

## 2020-09-06 ENCOUNTER — Ambulatory Visit: Payer: Self-pay | Admitting: Family

## 2020-10-01 ENCOUNTER — Other Ambulatory Visit: Payer: Self-pay

## 2020-10-02 ENCOUNTER — Other Ambulatory Visit: Payer: Self-pay

## 2020-10-03 NOTE — Progress Notes (Signed)
Erroneous encounter

## 2020-10-07 ENCOUNTER — Encounter: Payer: Self-pay | Admitting: Family

## 2020-10-07 DIAGNOSIS — Z130 Encounter for screening for diseases of the blood and blood-forming organs and certain disorders involving the immune mechanism: Secondary | ICD-10-CM

## 2020-10-07 DIAGNOSIS — Z13220 Encounter for screening for lipoid disorders: Secondary | ICD-10-CM

## 2020-10-07 DIAGNOSIS — Z1329 Encounter for screening for other suspected endocrine disorder: Secondary | ICD-10-CM

## 2020-10-07 DIAGNOSIS — E1165 Type 2 diabetes mellitus with hyperglycemia: Secondary | ICD-10-CM

## 2020-10-07 DIAGNOSIS — Z1211 Encounter for screening for malignant neoplasm of colon: Secondary | ICD-10-CM

## 2020-10-07 DIAGNOSIS — Z13228 Encounter for screening for other metabolic disorders: Secondary | ICD-10-CM

## 2020-10-07 DIAGNOSIS — Z0000 Encounter for general adult medical examination without abnormal findings: Secondary | ICD-10-CM

## 2020-10-09 ENCOUNTER — Other Ambulatory Visit: Payer: Self-pay

## 2020-10-17 ENCOUNTER — Other Ambulatory Visit: Payer: Self-pay

## 2020-10-18 ENCOUNTER — Other Ambulatory Visit: Payer: Self-pay
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
## 2020-10-30 ENCOUNTER — Other Ambulatory Visit: Payer: Self-pay

## 2020-10-30 ENCOUNTER — Ambulatory Visit (INDEPENDENT_AMBULATORY_CARE_PROVIDER_SITE_OTHER): Payer: Self-pay | Admitting: Family

## 2020-10-30 ENCOUNTER — Encounter: Payer: Self-pay | Admitting: Family

## 2020-10-30 VITALS — BP 118/75 | HR 80 | Resp 16 | Ht 68.0 in | Wt 172.0 lb

## 2020-10-30 DIAGNOSIS — F419 Anxiety disorder, unspecified: Secondary | ICD-10-CM

## 2020-10-30 DIAGNOSIS — Z0000 Encounter for general adult medical examination without abnormal findings: Secondary | ICD-10-CM

## 2020-10-30 DIAGNOSIS — Z1211 Encounter for screening for malignant neoplasm of colon: Secondary | ICD-10-CM

## 2020-10-30 DIAGNOSIS — F32A Depression, unspecified: Secondary | ICD-10-CM

## 2020-10-30 DIAGNOSIS — Z13228 Encounter for screening for other metabolic disorders: Secondary | ICD-10-CM

## 2020-10-30 DIAGNOSIS — Z130 Encounter for screening for diseases of the blood and blood-forming organs and certain disorders involving the immune mechanism: Secondary | ICD-10-CM

## 2020-10-30 DIAGNOSIS — E1165 Type 2 diabetes mellitus with hyperglycemia: Secondary | ICD-10-CM

## 2020-10-30 DIAGNOSIS — Z23 Encounter for immunization: Secondary | ICD-10-CM

## 2020-10-30 DIAGNOSIS — E785 Hyperlipidemia, unspecified: Secondary | ICD-10-CM

## 2020-10-30 DIAGNOSIS — Z1329 Encounter for screening for other suspected endocrine disorder: Secondary | ICD-10-CM

## 2020-10-30 DIAGNOSIS — Z2821 Immunization not carried out because of patient refusal: Secondary | ICD-10-CM

## 2020-10-30 DIAGNOSIS — Z794 Long term (current) use of insulin: Secondary | ICD-10-CM

## 2020-10-30 LAB — POCT GLYCOSYLATED HEMOGLOBIN (HGB A1C): HbA1c, POC (controlled diabetic range): 10.1 % — AB (ref 0.0–7.0)

## 2020-10-30 MED ORDER — SERTRALINE HCL 50 MG PO TABS
25.0000 mg | ORAL_TABLET | Freq: Every day | ORAL | 0 refills | Status: DC
  Filled 2020-10-30: qty 15, 30d supply, fill #0

## 2020-10-30 MED ORDER — INSULIN GLARGINE 100 UNIT/ML SOLOSTAR PEN
PEN_INJECTOR | SUBCUTANEOUS | 0 refills | Status: DC
  Filled 2020-10-30: qty 7.42, 28d supply, fill #0

## 2020-10-30 MED ORDER — TECHLITE PEN NEEDLES 32G X 4 MM MISC
1.0000 | 0 refills | Status: DC | PRN
  Filled 2020-10-30: qty 100, 30d supply, fill #0

## 2020-10-30 NOTE — Patient Instructions (Signed)
Preventive Care 40-51 Years Old, Male Preventive care refers to lifestyle choices and visits with your health care provider that can promote health and wellness. This includes: A yearly physical exam. This is also called an annual wellness visit. Regular dental and eye exams. Immunizations. Screening for certain conditions. Healthy lifestyle choices, such as: Eating a healthy diet. Getting regular exercise. Not using drugs or products that contain nicotine and tobacco. Limiting alcohol use. What can I expect for my preventive care visit? Physical exam Your health care provider will check your: Height and weight. These may be used to calculate your BMI (body mass index). BMI is a measurement that tells if you are at a healthy weight. Heart rate and blood pressure. Body temperature. Skin for abnormal spots. Counseling Your health care provider may ask you questions about your: Past medical problems. Family's medical history. Alcohol, tobacco, and drug use. Emotional well-being. Home life and relationship well-being. Sexual activity. Diet, exercise, and sleep habits. Work and work environment. Access to firearms. What immunizations do I need? Vaccines are usually given at various ages, according to a schedule. Your health care provider will recommend vaccines for you based on your age, medical history, and lifestyle or other factors, such as travel or where you work. What tests do I need? Blood tests Lipid and cholesterol levels. These may be checked every 5 years, or more often if you are over 50 years old. Hepatitis C test. Hepatitis B test. Screening Lung cancer screening. You may have this screening every year starting at age 55 if you have a 30-pack-year history of smoking and currently smoke or have quit within the past 15 years. Prostate cancer screening. Recommendations will vary depending on your family history and other risks. Genital exam to check for testicular cancer  or hernias. Colorectal cancer screening. All adults should have this screening starting at age 50 and continuing until age 75. Your health care provider may recommend screening at age 45 if you are at increased risk. You will have tests every 1-10 years, depending on your results and the type of screening test. Diabetes screening. This is done by checking your blood sugar (glucose) after you have not eaten for a while (fasting). You may have this done every 1-3 years. STD (sexually transmitted disease) testing, if you are at risk. Follow these instructions at home: Eating and drinking  Eat a diet that includes fresh fruits and vegetables, whole grains, lean protein, and low-fat dairy products. Take vitamin and mineral supplements as recommended by your health care provider. Do not drink alcohol if your health care provider tells you not to drink. If you drink alcohol: Limit how much you have to 0-2 drinks a day. Be aware of how much alcohol is in your drink. In the [place] one drink equals one 12 oz bottle of beer (355 mL), one 5 oz glass of wine (148 mL), or one 1 oz glass of hard liquor (44 mL). Lifestyle Take daily care of your teeth and gums. Brush your teeth every morning and night with fluoride toothpaste. Floss one time each day. Stay active. Exercise for at least 30 minutes 5 or more days each week. Do not use any products that contain nicotine or tobacco, such as cigarettes, e-cigarettes, and chewing tobacco. If you need help quitting, ask your health care provider. Do not use drugs. If you are sexually active, practice safe sex. Use a condom or other form of protection to prevent STIs (sexually transmitted infections). If told by your   health care provider, take low-dose aspirin daily starting at age 50. Find healthy ways to cope with stress, such as: Meditation, yoga, or listening to music. Journaling. Talking to a trusted person. Spending time with friends and  family. Safety Always wear your seat belt while driving or riding in a vehicle. Do not drive: If you have been drinking alcohol. Do not ride with someone who has been drinking. When you are tired or distracted. While texting. Wear a helmet and other protective equipment during sports activities. If you have firearms in your house, make sure you follow all gun safety procedures. What's next? Go to your health care provider once a year for an annual wellness visit. Ask your health care provider how often you should have your eyes and teeth checked. Stay up to date on all vaccines. This information is not intended to replace advice given to you by your health care provider. Make sure you discuss any questions you have with your health care provider. Document Revised: 03/08/2020 Document Reviewed: 12/23/2017 Elsevier Patient Education  2022 [employer] Inc[employer]   

## 2020-10-31 ENCOUNTER — Other Ambulatory Visit: Payer: Self-pay

## 2020-10-31 ENCOUNTER — Other Ambulatory Visit: Payer: Self-pay | Admitting: Family

## 2020-10-31 DIAGNOSIS — E785 Hyperlipidemia, unspecified: Secondary | ICD-10-CM | POA: Insufficient documentation

## 2020-10-31 LAB — MICROALBUMIN / CREATININE URINE RATIO
Creatinine, Urine: 137.4 mg/dL
Microalb/Creat Ratio: 21 mg/g creat (ref 0–29)
Microalbumin, Urine: 28.6 ug/mL

## 2020-10-31 LAB — LIPID PANEL
Chol/HDL Ratio: 3.5 ratio (ref 0.0–5.0)
Cholesterol, Total: 176 mg/dL (ref 100–199)
HDL: 50 mg/dL (ref >39)
LDL Chol Calc (NIH): 116 mg/dL — ABNORMAL HIGH (ref 0–99)
Triglycerides: 53 mg/dL (ref 0–149)
VLDL Cholesterol Cal: 10 mg/dL (ref 5–40)

## 2020-10-31 LAB — HEPATIC FUNCTION PANEL
ALT: 17 IU/L (ref 0–44)
AST: 18 IU/L (ref 0–40)
Albumin: 4.4 g/dL (ref 4.0–5.0)
Alkaline Phosphatase: 56 IU/L (ref 44–121)
Bilirubin Total: 0.4 mg/dL (ref 0.0–1.2)
Bilirubin, Direct: 0.11 mg/dL (ref 0.00–0.40)
Total Protein: 6.7 g/dL (ref 6.0–8.5)

## 2020-10-31 LAB — TSH: TSH: 1.73 u[IU]/mL (ref 0.450–4.500)

## 2020-10-31 MED ORDER — SERTRALINE HCL 50 MG PO TABS
50.0000 mg | ORAL_TABLET | Freq: Every day | ORAL | 0 refills | Status: DC
  Filled 2020-10-31: qty 30, 30d supply, fill #0

## 2020-10-31 MED ORDER — INSULIN GLARGINE 100 UNIT/ML SOLOSTAR PEN
PEN_INJECTOR | SUBCUTANEOUS | 0 refills | Status: DC
  Filled 2020-10-31 – 2021-01-28 (×3): qty 9, 28d supply, fill #0

## 2020-10-31 MED ORDER — ATORVASTATIN CALCIUM 20 MG PO TABS
20.0000 mg | ORAL_TABLET | Freq: Every day | ORAL | 0 refills | Status: DC
Start: 2020-10-31 — End: 2021-06-17
  Filled 2020-11-20 – 2021-01-28 (×2): qty 30, 30d supply, fill #0

## 2020-10-31 NOTE — Progress Notes (Signed)
Liver function normal.   Thyroid function normal.

## 2020-10-31 NOTE — Progress Notes (Signed)
Cholesterol higher than expected. High cholesterol may increase risk of heart attack and/or stroke. Consider eating more fruits, vegetables, and lean baked meats such as chicken or fish. Moderate intensity exercise at least 150 minutes as tolerated per week may help as well.   Begin Atorvastatin (Lipitor) for high cholesterol. Encouraged to recheck cholesterol at lab only appointment in 3 to 6 months.  Diabetes discussed in office.   The following is for provider reference only:  The 10-year ASCVD risk score (Arnett DK, et al., 2019) is: 8.3%   Values used to calculate the score:     Age: 60 years     Sex: Male     Is Non-Hispanic African American: Yes     Diabetic: Yes     Tobacco smoker: No     Systolic Blood Pressure: 118 mmHg     Is BP treated: No     HDL Cholesterol: 50 mg/dL     Total Cholesterol: 176 mg/dL

## 2020-10-31 NOTE — Progress Notes (Signed)
Microalbumin / creatinine urine ratio normal which means diabetes is not affecting the kidneys.

## 2020-11-06 ENCOUNTER — Other Ambulatory Visit: Payer: Self-pay

## 2020-11-20 ENCOUNTER — Other Ambulatory Visit: Payer: Self-pay

## 2020-11-22 NOTE — Progress Notes (Signed)
Erroneous encounter

## 2020-11-23 ENCOUNTER — Other Ambulatory Visit: Payer: Self-pay

## 2020-11-23 ENCOUNTER — Telehealth (HOSPITAL_COMMUNITY): Payer: No Payment, Other | Admitting: Psychiatry

## 2020-11-26 ENCOUNTER — Encounter: Payer: Medicaid Other | Admitting: Family

## 2020-11-26 DIAGNOSIS — E1165 Type 2 diabetes mellitus with hyperglycemia: Secondary | ICD-10-CM

## 2020-11-27 ENCOUNTER — Other Ambulatory Visit: Payer: Self-pay

## 2021-01-26 ENCOUNTER — Other Ambulatory Visit: Payer: Self-pay

## 2021-01-26 ENCOUNTER — Emergency Department (HOSPITAL_COMMUNITY)
Admission: EM | Admit: 2021-01-26 | Discharge: 2021-01-27 | Discharge status: Against Medical Advice | Payer: Medicaid Other | Attending: Emergency Medicine | Admitting: Emergency Medicine

## 2021-01-26 ENCOUNTER — Encounter (HOSPITAL_COMMUNITY): Payer: Self-pay | Admitting: *Deleted

## 2021-01-26 DIAGNOSIS — E1065 Type 1 diabetes mellitus with hyperglycemia: Secondary | ICD-10-CM | POA: Insufficient documentation

## 2021-01-26 DIAGNOSIS — K0889 Other specified disorders of teeth and supporting structures: Secondary | ICD-10-CM | POA: Insufficient documentation

## 2021-01-26 DIAGNOSIS — L02415 Cutaneous abscess of right lower limb: Secondary | ICD-10-CM | POA: Insufficient documentation

## 2021-01-26 DIAGNOSIS — Z5321 Procedure and treatment not carried out due to patient leaving prior to being seen by health care provider: Secondary | ICD-10-CM | POA: Insufficient documentation

## 2021-01-26 LAB — CBC WITH DIFFERENTIAL/PLATELET
Abs Immature Granulocytes: 0.01 10*3/uL (ref 0.00–0.07)
Basophils Absolute: 0 10*3/uL (ref 0.0–0.1)
Basophils Relative: 0 %
Eosinophils Absolute: 0.1 10*3/uL (ref 0.0–0.5)
Eosinophils Relative: 1 %
HCT: 45.9 % (ref 39.0–52.0)
Hemoglobin: 15.6 g/dL (ref 13.0–17.0)
Immature Granulocytes: 0 %
Lymphocytes Relative: 23 %
Lymphs Abs: 1.6 10*3/uL (ref 0.7–4.0)
MCH: 29 pg (ref 26.0–34.0)
MCHC: 34 g/dL (ref 30.0–36.0)
MCV: 85.3 fL (ref 80.0–100.0)
Monocytes Absolute: 1.1 10*3/uL — ABNORMAL HIGH (ref 0.1–1.0)
Monocytes Relative: 15 %
Neutro Abs: 4.4 10*3/uL (ref 1.7–7.7)
Neutrophils Relative %: 61 %
Platelets: 196 10*3/uL (ref 150–400)
RBC: 5.38 MIL/uL (ref 4.22–5.81)
RDW: 12.4 % (ref 11.5–15.5)
WBC: 7.2 10*3/uL (ref 4.0–10.5)
nRBC: 0 % (ref 0.0–0.2)

## 2021-01-26 LAB — BASIC METABOLIC PANEL
Anion gap: 7 (ref 5–15)
BUN: 10 mg/dL (ref 6–20)
CO2: 24 mmol/L (ref 22–32)
Calcium: 8.2 mg/dL — ABNORMAL LOW (ref 8.9–10.3)
Chloride: 96 mmol/L — ABNORMAL LOW (ref 98–111)
Creatinine, Ser: 1.04 mg/dL (ref 0.61–1.24)
GFR, Estimated: >60 mL/min (ref >60)
Glucose, Bld: 501 mg/dL (ref 70–99)
Potassium: 4.3 mmol/L (ref 3.5–5.1)
Sodium: 127 mmol/L — ABNORMAL LOW (ref 135–145)

## 2021-01-26 LAB — CBG MONITORING, ED: Glucose-Capillary: 510 mg/dL (ref 70–99)

## 2021-01-26 NOTE — ED Triage Notes (Signed)
The pt is here for a leg abscess a cold abd pain and a toothache  he is also a diabetic that has not had any insulin or days

## 2021-01-26 NOTE — ED Provider Triage Note (Signed)
Emergency Medicine Provider Triage Evaluation Note  [name] [name] , a 51 y.o. male  was evaluated in triage.  Pt complains of right anterior thigh abscess over the past week.  He has a history of insulin-dependent diabetes.  He has also had some URI symptoms and has not taken his diabetes medications in the past 3 or 4 days.  Though fevers, nausea, vomiting, diarrhea.  Patient has noted some drainage from the abscess on the right leg.  States that he previously had a place on his back that was similar but this is improved.  Review of Systems  Positive: Leg pain, boil Negative: Vomiting  Physical Exam  BP (!) 109/57 (BP Location: Right Arm)    Pulse 90    Temp 99 F (37.2 C)    Resp 17    SpO2 99%  Gen:   Awake, no distress   Resp:  Normal effort  MSK:   Moves extremities without difficulty  Other:  Patient with 3 to 4 cm area of induration right anterior thigh, proximally, without active drainage  Medical Decision Making  Medically screening exam initiated at 9:17 PM.  Appropriate orders placed.  [name] [name] was informed that the remainder of the evaluation will be completed by another provider, this initial triage assessment does not replace that evaluation, and the importance of remaining in the ED until their evaluation is complete.     Renne Crigler, PA-C 01/26/21 2118

## 2021-01-27 NOTE — ED Notes (Signed)
Pt stated he was leaving AMA 

## 2021-01-28 ENCOUNTER — Other Ambulatory Visit: Payer: Self-pay

## 2021-01-28 ENCOUNTER — Encounter (HOSPITAL_BASED_OUTPATIENT_CLINIC_OR_DEPARTMENT_OTHER): Payer: Self-pay | Admitting: Emergency Medicine

## 2021-01-28 ENCOUNTER — Emergency Department (HOSPITAL_BASED_OUTPATIENT_CLINIC_OR_DEPARTMENT_OTHER)
Admission: EM | Admit: 2021-01-28 | Discharge: 2021-01-28 | Disposition: A | Discharge status: Home / Self Care | Payer: Medicaid Other | Attending: Emergency Medicine | Admitting: Emergency Medicine

## 2021-01-28 DIAGNOSIS — K0889 Other specified disorders of teeth and supporting structures: Secondary | ICD-10-CM | POA: Insufficient documentation

## 2021-01-28 DIAGNOSIS — Z7984 Long term (current) use of oral hypoglycemic drugs: Secondary | ICD-10-CM | POA: Insufficient documentation

## 2021-01-28 DIAGNOSIS — Z79899 Other long term (current) drug therapy: Secondary | ICD-10-CM | POA: Insufficient documentation

## 2021-01-28 DIAGNOSIS — Z794 Long term (current) use of insulin: Secondary | ICD-10-CM | POA: Insufficient documentation

## 2021-01-28 DIAGNOSIS — L02415 Cutaneous abscess of right lower limb: Secondary | ICD-10-CM | POA: Insufficient documentation

## 2021-01-28 MED ORDER — CLINDAMYCIN HCL 150 MG PO CAPS
300.0000 mg | ORAL_CAPSULE | Freq: Once | ORAL | Status: AC
  Administered 2021-01-28: 300 mg via ORAL
  Filled 2021-01-28: qty 2

## 2021-01-28 MED ORDER — CLINDAMYCIN HCL 150 MG PO CAPS: 300.0000 mg | ORAL_CAPSULE | Freq: Four times a day (QID) | ORAL | 0 refills | Status: DC

## 2021-01-28 MED ORDER — HYDROCODONE-ACETAMINOPHEN 5-325 MG PO TABS: 1.0000 | ORAL_TABLET | ORAL | 0 refills | Status: DC | PRN

## 2021-01-28 NOTE — ED Provider Notes (Signed)
[facility] Provider Note   CSN: [id number] Arrival date & time: 01/28/21  0436     History  Chief Complaint  Patient presents with   Abscess    [name] [name] is a 51 y.o. male.  Patient presents to the emergency department with concerns over an abscess on his right thigh as well as a dental problem.  Symptoms present for several days.  Patient reports drainage from the area on his right thigh which is very painful and tender to the touch.  Patient also reports that he has some decayed and fractured teeth on the left upper side of his mouth which have been hurting.  He is now experiencing some pain across the left face.  No facial swelling.  No fevers.      Home Medications Prior to Admission medications   Medication Sig Start Date End Date Taking? Authorizing Provider  clindamycin (CLEOCIN) 150 MG capsule Take 2 capsules (300 mg total) by mouth 4 (four) times daily. 01/28/21  Yes Elwyn Klosinski, Gwenyth Allegra, MD  HYDROcodone-acetaminophen (NORCO/VICODIN) 5-325 MG tablet Take 1 tablet by mouth every 4 (four) hours as needed for moderate pain. 01/28/21  Yes Nichols Corter, Gwenyth Allegra, MD  Accu-Chek Softclix Lancets lancets Use as directed. 07/19/20   Danford, Suann Larry, MD  atorvastatin (LIPITOR) 20 MG tablet Take 1 tablet (20 mg total) by mouth daily. Patient not taking: Reported on 01/26/2021 10/31/20 02/28/21  Camillia Herter, NP  Blood Glucose Monitoring Suppl (ACCU-CHEK GUIDE) w/Device KIT Check blood sugar once daily in the morning before taking Lantus 07/19/20   Danford, Suann Larry, MD  diphenhydrAMINE (BENADRYL) 25 MG tablet Take 50 mg by mouth every 6 (six) hours as needed for allergies. Patient not taking: Reported on 10/30/2020    [provider]  gabapentin (NEURONTIN) 100 MG capsule Take 1 capsule (100 mg total) by mouth 3 (three) times daily. Patient not taking: Reported on 01/26/2021 07/31/20 09/13/20  Camillia Herter, NP  glucose blood  (ACCU-CHEK GUIDE) test strip Use as instructed 07/19/20   Edwin Dada, MD  ibuprofen (ADVIL) 200 MG tablet Take 400 mg by mouth every 6 (six) hours as needed for fever or moderate pain.  Patient not taking: Reported on 10/30/2020    [provider]  insulin glargine (LANTUS) 100 UNIT/ML injection Inject 20 Units into the skin daily.    [provider]  insulin glargine (LANTUS) 100 UNIT/ML Solostar Pen Inject 25 Units into the skin daily for 14 days, THEN 28 Units daily for 14 days. Patient not taking: Reported on 01/26/2021 10/31/20 11/28/20  Camillia Herter, NP  Insulin Pen Needle (TECHLITE PEN NEEDLES) 32G X 4 MM MISC Use 1 pen needle as needed 10/30/20   Camillia Herter, NP  metFORMIN (GLUCOPHAGE) 1000 MG tablet Take 1 tablet (1,000 mg total) by mouth daily with breakfast. 07/19/20   Danford, Suann Larry, MD  sertraline (ZOLOFT) 50 MG tablet Take 1 tablet (50 mg total) by mouth daily. Patient not taking: Reported on 01/26/2021 10/31/20 11/30/20  Camillia Herter, NP      Allergies    Patient has no known allergies.    Review of Systems   Review of Systems  HENT:  Positive for dental problem.   Skin:  Positive for wound.   Physical Exam Updated Vital Signs BP (!) 122/104    Pulse 96    Temp 98.7 F (37.1 C) (Oral)    Resp 18    Wt 75 kg  SpO2 97%    BMI 24.42 kg/m  Physical Exam Vitals and nursing note reviewed.  Constitutional:      General: He is not in acute distress.    Appearance: Normal appearance. He is well-developed.  HENT:     Head: Normocephalic and atraumatic.     Right Ear: Hearing normal.     Left Ear: Hearing normal.     Nose: Nose normal.     Mouth/Throat:     Dentition: Abnormal dentition. Dental tenderness and dental caries present. No dental abscesses.   Eyes:     Conjunctiva/sclera: Conjunctivae normal.     Pupils: Pupils are equal, round, and reactive to light.  Cardiovascular:     Rate and Rhythm: Regular rhythm.     Heart  sounds: S1 normal and S2 normal. No murmur heard.   No friction rub. No gallop.  Pulmonary:     Effort: Pulmonary effort is normal. No respiratory distress.     Breath sounds: Normal breath sounds.  Chest:     Chest wall: No tenderness.  Abdominal:     General: Bowel sounds are normal.     Palpations: Abdomen is soft.     Tenderness: There is no abdominal tenderness. There is no guarding or rebound. Negative signs include Murphy's sign and McBurney's sign.     Hernia: No hernia is present.  Musculoskeletal:        General: Normal range of motion.     Cervical back: Normal range of motion and neck supple.  Skin:    General: Skin is warm and dry.     Findings: Wound (1 cm scab right upper outer thigh with surrounding erythema, mild induration, no fluctuance) present. No rash.  Neurological:     Mental Status: He is alert and oriented to person, place, and time.     GCS: GCS eye subscore is 4. GCS verbal subscore is 5. GCS motor subscore is 6.     Cranial Nerves: No cranial nerve deficit.     Sensory: No sensory deficit.     Coordination: Coordination normal.  Psychiatric:        Speech: Speech normal.        Behavior: Behavior normal.        Thought Content: Thought content normal.    ED Results / Procedures / Treatments   Labs (all labs ordered are listed, but only abnormal results are displayed) Labs Reviewed - No data to display  EKG None  Radiology No results found.  Procedures Procedures    Medications Ordered in ED Medications  clindamycin (CLEOCIN) capsule 300 mg (has no administration in time range)    ED Course/ Medical Decision Making/ A&P                           Medical Decision Making  Patient with dental pain.  Patient does have caries noted in the left upper side of his mouth adjacent to the area of pain.  No facial swelling or sign of dental abscess at this time.  Patient also complaining of a painful lesion on his right leg.  This does appear  to be a previously spontaneously drained abscess with continued induration but no abscess cavity to drain at this time.  Patient was placed on clindamycin which should help with dental infection as well as skin infection.  Given return precautions.         Final Clinical Impression(s) / ED Diagnoses Final  diagnoses:  Pain, dental  Cutaneous abscess of right lower extremity    Rx / DC Orders ED Discharge Orders          Ordered    clindamycin (CLEOCIN) 150 MG capsule  4 times daily        01/28/21 0704    HYDROcodone-acetaminophen (NORCO/VICODIN) 5-325 MG tablet  Every 4 hours PRN        01/28/21 0704              Orpah Greek, MD 01/28/21 [phone]

## 2021-01-28 NOTE — ED Notes (Signed)
Pt ambulates with steady gait, independently to room. Requests new face mask, provided.

## 2021-01-28 NOTE — ED Triage Notes (Signed)
Pt presents for wound to R upper thigh and swelling to L cheek, both since Thursday. R upper thigh wound appears scabbed in triage, positive purulent drainage at home per pt.  L cheek swelling : Endorses dental caries to L upper. Denies vision change, difficulty swallowing.   H/o DM

## 2021-02-02 ENCOUNTER — Encounter (HOSPITAL_COMMUNITY): Payer: Self-pay | Admitting: Emergency Medicine

## 2021-02-02 ENCOUNTER — Other Ambulatory Visit: Payer: Self-pay

## 2021-02-02 ENCOUNTER — Emergency Department (HOSPITAL_COMMUNITY)
Admission: EM | Admit: 2021-02-02 | Discharge: 2021-02-02 | Disposition: A | Discharge status: Home / Self Care | Payer: Medicaid Other | Attending: Emergency Medicine | Admitting: Emergency Medicine

## 2021-02-02 DIAGNOSIS — Z794 Long term (current) use of insulin: Secondary | ICD-10-CM | POA: Insufficient documentation

## 2021-02-02 DIAGNOSIS — Z7984 Long term (current) use of oral hypoglycemic drugs: Secondary | ICD-10-CM | POA: Insufficient documentation

## 2021-02-02 DIAGNOSIS — L02415 Cutaneous abscess of right lower limb: Secondary | ICD-10-CM | POA: Insufficient documentation

## 2021-02-02 DIAGNOSIS — E119 Type 2 diabetes mellitus without complications: Secondary | ICD-10-CM | POA: Insufficient documentation

## 2021-02-02 DIAGNOSIS — L0291 Cutaneous abscess, unspecified: Secondary | ICD-10-CM

## 2021-02-02 MED ORDER — LIDOCAINE HCL (PF) 1 % IJ SOLN
10.0000 mL | Freq: Once | INTRAMUSCULAR | Status: AC
  Administered 2021-02-02: 10 mL via INTRADERMAL
  Filled 2021-02-02: qty 10

## 2021-02-02 MED ORDER — KETOROLAC TROMETHAMINE 60 MG/2ML IM SOLN
30.0000 mg | Freq: Once | INTRAMUSCULAR | Status: AC
  Administered 2021-02-02: 30 mg via INTRAMUSCULAR
  Filled 2021-02-02: qty 2

## 2021-02-02 NOTE — ED Triage Notes (Signed)
Patient from home, complaint of abscess on right thigh. VSS. NAD.

## 2021-02-02 NOTE — ED Provider Notes (Signed)
[facility] DEPART[facility] Provider Note   CSN: [id number] Arrival date & time: 02/02/21  1103     History Chief Complaint  Patient presents with   Abscess    [name] [name] is a 51 y.o. male with h/o DM presents to the ED for evaluation of left leg abscess. The patient reports he was seen on 01-28-2021 for evaluation of this abscess and for his dental pain.  He reports his dental pain is improved but he is still having trouble with his left leg.  He reports he did not did an I&D drainages or something to drain at that time.  He reports that later in the week it came to ahead and drained copious amounts of purulent discharge.  A scab is formed, but he states ports there is still having some pain although not as bad as it was initially.  He would like it evaluated for possible drainage again.  He denies any fevers, numbness, tingling, pain in the musculature.  Full range of motion.  Patient reports he has not missed a dose of the clindamycin and is still taking it as prescribed.  Medical history as listed above.  Denies any surgeries.   Abscess Associated symptoms: no fever       Home Medications Prior to Admission medications   Medication Sig Start Date End Date Taking? Authorizing Provider  Accu-Chek Softclix Lancets lancets Use as directed. 07/19/20   Danford, Suann Larry, MD  atorvastatin (LIPITOR) 20 MG tablet Take 1 tablet (20 mg total) by mouth daily. Patient not taking: Reported on 01/26/2021 10/31/20 02/28/21  Camillia Herter, NP  Blood Glucose Monitoring Suppl (ACCU-CHEK GUIDE) w/Device KIT Check blood sugar once daily in the morning before taking Lantus 07/19/20   Danford, Suann Larry, MD  clindamycin (CLEOCIN) 150 MG capsule Take 2 capsules (300 mg total) by mouth 4 (four) times daily. 01/28/21   Orpah Greek, MD  diphenhydrAMINE (BENADRYL) 25 MG tablet Take 50 mg by mouth every 6 (six) hours as needed for allergies. Patient not taking: Reported  on 10/30/2020    [provider]  gabapentin (NEURONTIN) 100 MG capsule Take 1 capsule (100 mg total) by mouth 3 (three) times daily. Patient not taking: Reported on 01/26/2021 07/31/20 09/13/20  Camillia Herter, NP  glucose blood (ACCU-CHEK GUIDE) test strip Use as instructed 07/19/20   Danford, Suann Larry, MD  HYDROcodone-acetaminophen (NORCO/VICODIN) 5-325 MG tablet Take 1 tablet by mouth every 4 (four) hours as needed for moderate pain. 01/28/21   Orpah Greek, MD  ibuprofen (ADVIL) 200 MG tablet Take 400 mg by mouth every 6 (six) hours as needed for fever or moderate pain.  Patient not taking: Reported on 10/30/2020    [provider]  insulin glargine (LANTUS) 100 UNIT/ML injection Inject 20 Units into the skin daily.    [provider]  insulin glargine (LANTUS) 100 UNIT/ML Solostar Pen Inject 25 Units into the skin daily for 14 days, THEN 28 Units daily for 14 days. Patient not taking: Reported on 01/26/2021 10/31/20 02/25/21  Camillia Herter, NP  Insulin Pen Needle (TECHLITE PEN NEEDLES) 32G X 4 MM MISC Use 1 pen needle as needed 10/30/20   Camillia Herter, NP  metFORMIN (GLUCOPHAGE) 1000 MG tablet Take 1 tablet (1,000 mg total) by mouth daily with breakfast. 07/19/20   Danford, Suann Larry, MD  sertraline (ZOLOFT) 50 MG tablet Take 1 tablet (50 mg total) by mouth daily. Patient not taking: Reported on  01/26/2021 10/31/20 11/30/20  Camillia Herter, NP      Allergies    Patient has no known allergies.    Review of Systems   Review of Systems  Constitutional:  Negative for chills and fever.  Musculoskeletal:  Negative for myalgias.  Skin:  Positive for wound.  All other systems reviewed and are negative.  Physical Exam Updated Vital Signs BP 113/78 (BP Location: Right Arm)    Pulse (!) 121    Temp 98.8 F (37.1 C) (Oral)    Resp 18    SpO2 98%  Physical Exam Vitals and nursing note reviewed.  Constitutional:      Appearance: Normal appearance.   Eyes:     General: No scleral icterus. Pulmonary:     Effort: Pulmonary effort is normal. No respiratory distress.  Skin:    General: Skin is dry.     Findings: No rash.     Comments: 5 cm area of induration with discoloration although no red streaking or increased warmth to touch.  There is some focal fluctuation as well as a scab.  Mildly tender to touch.  No active drainage seen.  Neurological:     General: No focal deficit present.     Mental Status: He is alert. Mental status is at baseline.  Psychiatric:        Mood and Affect: Mood normal.    ED Results / Procedures / Treatments   Labs (all labs ordered are listed, but only abnormal results are displayed) Labs Reviewed - No data to display  EKG None  Radiology No results found.  Procedures .Marland KitchenIncision and Drainage  Date/Time: 02/02/2021 5:57 PM Performed by: Sherrell Puller, PA-C Authorized by: Sherrell Puller, PA-C   Consent:    Consent obtained:  Verbal   Consent given by:  Patient   Risks, benefits, and alternatives were discussed: yes     Risks discussed:  Bleeding, incomplete drainage, pain and damage to other organs   Alternatives discussed:  No treatment Universal protocol:    Procedure explained and questions answered to patient or proxy's satisfaction: yes     Relevant documents present and verified: no     Test results available : no     Imaging studies available: no     Required blood products, implants, devices, and special equipment available: no     Site/side marked: no     Immediately prior to procedure, a time out was called: no     Patient identity confirmed:  Verbally with patient Location:    Type:  Abscess   Location:  Lower extremity   Lower extremity location:  Leg   Leg location:  R upper leg Pre-procedure details:    Skin preparation:  Betadine Anesthesia:    Anesthesia method:  Local infiltration   Local anesthetic:  Lidocaine 1% WITH epi Procedure type:    Complexity:   Complex Procedure details:    Incision types:  Single straight   Incision depth:  Subcutaneous   Wound management:  Probed and deloculated, irrigated with saline and extensive cleaning   Drainage:  Purulent and bloody   Drainage amount:  Moderate   Packing materials:  None Post-procedure details:    Procedure completion:  Tolerated well, no immediate complications   Medications Ordered in ED Medications  lidocaine (PF) (XYLOCAINE) 1 % injection 10 mL (10 mLs Intradermal Given by Other 02/02/21 1433)  ketorolac (TORADOL) injection 30 mg (30 mg Intramuscular Given 02/02/21 1433)    ED Course/  Medical Decision Making/ A&P                           Medical Decision Making Risk Prescription drug management.   51 year old male with history of diabetes presents the emergency department for evaluation of gradually improving abscess to his right upper leg.  Patient currently on clindamycin.  Vital signs are stable.  Patient is normotensive, afebrile, satting 100% on room air.  Patient is mildly tender to palpation.  No increased warmth to touch.  No red streaking.  Not concern for any deep space infection.  My attending assessed at bedside and recommended I&D.  I&D successful with minimal amount of purulent bloody discharge.  No packing placed.  Bandage covered.  Recommended patient continue on his clindamycin.  Wound care discussed.  Follow-up with his PCP within the week advised for reevaluation of the wound.  Strict return precautions discussed.  Patient recent plan.  Patient is stable be discharged home in good condition peer  Final Clinical Impression(s) / ED Diagnoses Final diagnoses:  Abscess    Rx / DC Orders ED Discharge Orders     None         Sherrell Puller, PA-C 02/02/21 1800    Isla Pence, MD 02/03/21 [phone]

## 2021-02-02 NOTE — ED Provider Triage Note (Signed)
Emergency Medicine Provider Triage Evaluation Note  [name] [name] , a 51 y.o. male  was evaluated in triage.  Pt complains of abscess.  He first noticed it last week. He has been on clindamycin since the 17th.  He states that last night he picked at it and had purulent material come out. He has been compliant with the clinda. No fevers  Review of Systems  Positive:  Negative:  See above  Physical Exam  BP 113/78 (BP Location: Right Arm)    Pulse (!) 121    Temp 98.8 F (37.1 C) (Oral)    Resp 18    SpO2 98%  Gen:   Awake, no distress   Resp:  Normal effort  MSK:   Moves extremities without difficulty  Other:  Large abscess on right thigh.   Medical Decision Making  Medically screening exam initiated at 11:30 AM.  Appropriate orders placed.  [name] [name] was informed that the remainder of the evaluation will be completed by another provider, this initial triage assessment does not replace that evaluation, and the importance of remaining in the ED until their evaluation is complete.  Note: Portions of this report may have been transcribed using voice recognition software. Every effort was made to ensure accuracy; however, inadvertent computerized transcription errors may be present    Cristina Gong, PA-C 02/02/21 1133

## 2021-02-02 NOTE — Discharge Instructions (Addendum)
Please continue taking the clindamycin for your dental and skin problems. Please clean the area with soap and water daily. You can keep a bandage over the area if it is still actively draining. Follow up with you PCP this week for re-evaluation. If you have any concern, new or worsening symptoms, please return to the nearest ER.

## 2021-02-20 ENCOUNTER — Encounter (HOSPITAL_COMMUNITY): Payer: Self-pay | Admitting: Psychiatry

## 2021-02-20 ENCOUNTER — Other Ambulatory Visit: Payer: Self-pay

## 2021-02-20 ENCOUNTER — Telehealth (INDEPENDENT_AMBULATORY_CARE_PROVIDER_SITE_OTHER): Payer: Self-pay | Admitting: Psychiatry

## 2021-02-20 DIAGNOSIS — F32A Depression, unspecified: Secondary | ICD-10-CM

## 2021-02-20 DIAGNOSIS — F331 Major depressive disorder, recurrent, moderate: Secondary | ICD-10-CM

## 2021-02-20 DIAGNOSIS — F0630 Mood disorder due to known physiological condition, unspecified: Secondary | ICD-10-CM

## 2021-02-20 DIAGNOSIS — F419 Anxiety disorder, unspecified: Secondary | ICD-10-CM

## 2021-02-20 DIAGNOSIS — F411 Generalized anxiety disorder: Secondary | ICD-10-CM

## 2021-02-20 MED ORDER — LAMOTRIGINE 25 MG PO TABS
25.0000 mg | ORAL_TABLET | Freq: Every day | ORAL | 0 refills | Status: DC
  Filled 2021-02-20: qty 60, 60d supply, fill #0

## 2021-02-20 MED ORDER — SERTRALINE HCL 50 MG PO TABS
50.0000 mg | ORAL_TABLET | Freq: Every day | ORAL | 0 refills | Status: DC
  Filled 2021-02-20: qty 30, 30d supply, fill #0
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
## 2021-02-25 ENCOUNTER — Other Ambulatory Visit: Payer: Self-pay

## 2021-02-25 ENCOUNTER — Other Ambulatory Visit: Payer: Self-pay | Admitting: Family

## 2021-02-25 DIAGNOSIS — Z794 Long term (current) use of insulin: Secondary | ICD-10-CM

## 2021-02-25 DIAGNOSIS — E1165 Type 2 diabetes mellitus with hyperglycemia: Secondary | ICD-10-CM

## 2021-02-26 ENCOUNTER — Other Ambulatory Visit: Payer: Self-pay

## 2021-04-07 ENCOUNTER — Telehealth (INDEPENDENT_AMBULATORY_CARE_PROVIDER_SITE_OTHER): Payer: Self-pay | Admitting: Psychiatry

## 2021-04-07 ENCOUNTER — Encounter (HOSPITAL_COMMUNITY): Payer: Self-pay | Admitting: Psychiatry

## 2021-04-07 ENCOUNTER — Other Ambulatory Visit: Payer: Self-pay

## 2021-04-07 DIAGNOSIS — F32A Depression, unspecified: Secondary | ICD-10-CM

## 2021-04-07 DIAGNOSIS — F0630 Mood disorder due to known physiological condition, unspecified: Secondary | ICD-10-CM

## 2021-04-07 DIAGNOSIS — F411 Generalized anxiety disorder: Secondary | ICD-10-CM

## 2021-04-07 DIAGNOSIS — F331 Major depressive disorder, recurrent, moderate: Secondary | ICD-10-CM

## 2021-04-07 DIAGNOSIS — F419 Anxiety disorder, unspecified: Secondary | ICD-10-CM

## 2021-04-07 MED ORDER — LAMOTRIGINE 100 MG PO TABS
100.0000 mg | ORAL_TABLET | Freq: Every day | ORAL | 1 refills | Status: DC
  Filled 2021-04-07 – 2021-05-28 (×2): qty 30, 30d supply, fill #0
  Filled 2021-09-09: qty 30, 30d supply, fill #1

## 2021-04-07 MED ORDER — SERTRALINE HCL 100 MG PO TABS
100.0000 mg | ORAL_TABLET | Freq: Every day | ORAL | 1 refills | Status: DC
  Filled 2021-04-07 – 2021-05-28 (×2): qty 30, 30d supply, fill #0
  Filled 2021-09-09: qty 30, 30d supply, fill #1
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
## 2021-04-14 ENCOUNTER — Other Ambulatory Visit: Payer: Self-pay

## 2021-05-05 ENCOUNTER — Ambulatory Visit (INDEPENDENT_AMBULATORY_CARE_PROVIDER_SITE_OTHER): Payer: Self-pay | Admitting: Licensed Clinical Social Worker

## 2021-05-05 DIAGNOSIS — F411 Generalized anxiety disorder: Secondary | ICD-10-CM

## 2021-05-05 DIAGNOSIS — F0630 Mood disorder due to known physiological condition, unspecified: Secondary | ICD-10-CM

## 2021-05-06 ENCOUNTER — Encounter (HOSPITAL_COMMUNITY): Payer: Self-pay
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
## 2021-05-06 NOTE — Plan of Care (Signed)
?  Problem: Anger Management Problem  1 Anger expression ?Goal:  [name] will manage mood and anxiety as evidenced by expressing emotions appropriately, coping with daily stressors, and increasing anger management for 5 out of 7 days for 60 days.  ?Outcome: Not Progressing ?Goal: STG: [name] [name] will identify situations, thoughts, and feelings that trigger internal anger, angry verbal and/or aggressive behavioral actions as evidenced by a self-recorded report. ?Outcome: Not Progressing ?  ?

## 2021-05-26 ENCOUNTER — Telehealth (HOSPITAL_COMMUNITY): Payer: No Payment, Other | Admitting: Psychiatry

## 2021-05-28 ENCOUNTER — Other Ambulatory Visit: Payer: Self-pay | Admitting: Family

## 2021-05-28 ENCOUNTER — Other Ambulatory Visit: Payer: Self-pay

## 2021-05-28 DIAGNOSIS — E1165 Type 2 diabetes mellitus with hyperglycemia: Secondary | ICD-10-CM

## 2021-05-28 NOTE — Telephone Encounter (Signed)
Schedule appointment?

## 2021-05-28 NOTE — Addendum Note (Signed)
Addended by: Erie Noe on: 05/28/2021 05:48 PM ? ? Modules accepted: Orders ? ?

## 2021-05-28 NOTE — Telephone Encounter (Signed)
Pt called in and got scheduled an appt for 06/17/21 @1 :40, please advise.  ?

## 2021-05-29 ENCOUNTER — Other Ambulatory Visit: Payer: Self-pay

## 2021-05-29 ENCOUNTER — Telehealth: Payer: Self-pay | Admitting: Family

## 2021-05-29 NOTE — Telephone Encounter (Signed)
Pt would need an appt, at this time no available appts. Disability can be discussed at appt on 06/06

## 2021-05-29 NOTE — Telephone Encounter (Signed)
Requested medication (s) are due for refill today: Amount not specified  Requested medication (s) are on the active medication list: yes    Last refill: 01/26/21    Future visit scheduled yes  06/17/21  Notes to clinic:Historical  Provider.. Please review. Thank you  Requested Prescriptions  Pending Prescriptions Disp Refills   insulin glargine (LANTUS) 100 UNIT/ML injection 10 mL     Sig: Inject 0.2 mLs (20 Units total) into the skin daily.     Endocrinology:  Diabetes - Insulins Failed - 05/28/2021  5:48 PM      Failed - HBA1C is between 0 and 7.9 and within 180 days    HbA1c, POC (controlled diabetic range)  Date Value Ref Range Status  10/30/2020 10.1 (A) 0.0 - 7.0 % Final         Failed - Valid encounter within last 6 months    Recent Outpatient Visits           7 months ago Annual physical exam   [facility] at [facility], Amy J, NP   10 months ago Encounter to establish care   Primary Care at [facility], Amy J, NP       Future Appointments             In 2 weeks Camillia Herter, NP Primary [facility] at [place] Refills   insulin glargine (LANTUS SOLOSTAR) 100 UNIT/ML Solostar Pen 9 mL 0    Sig: Inject 25 Units into the skin daily for 14 days, THEN 28 Units daily for 14 days.     Endocrinology:  Diabetes - Insulins Failed - 05/28/2021  5:48 PM      Failed - HBA1C is between 0 and 7.9 and within 180 days    HbA1c, POC (controlled diabetic range)  Date Value Ref Range Status  10/30/2020 10.1 (A) 0.0 - 7.0 % Final         Failed - Valid encounter within last 6 months    Recent Outpatient Visits           7 months ago Annual physical exam   Primary Care at [facility], [employer], NP   10 months ago Encounter to establish care   Primary Care at [facility], Flonnie Hailstone, NP       Future Appointments             In 2 weeks Camillia Herter, NP Primary Care at [facility]

## 2021-05-29 NOTE — Telephone Encounter (Signed)
Copied from [place]. Topic: General - Other >> May 28, 2021  3:46 PM Valere Dross wrote: Reason for CRM: Pt called in stating he is wanting to see about disability and wanted to talk with a nurse or PCP about further information. Please advise.

## 2021-05-30 ENCOUNTER — Other Ambulatory Visit: Payer: Self-pay | Admitting: Family

## 2021-05-30 ENCOUNTER — Other Ambulatory Visit: Payer: Self-pay

## 2021-05-30 DIAGNOSIS — E1165 Type 2 diabetes mellitus with hyperglycemia: Secondary | ICD-10-CM

## 2021-06-05 ENCOUNTER — Other Ambulatory Visit: Payer: Self-pay

## 2021-06-05 ENCOUNTER — Telehealth: Payer: Self-pay | Admitting: *Deleted

## 2021-06-05 ENCOUNTER — Ambulatory Visit: Payer: Self-pay

## 2021-06-05 DIAGNOSIS — E1165 Type 2 diabetes mellitus with hyperglycemia: Secondary | ICD-10-CM

## 2021-06-05 NOTE — Telephone Encounter (Signed)
Requested medication (s) are due for refill today: yes  Requested medication (s) are on the active medication list: rx ende 02/25/21  Last refill:  10/31/20 #9 ml  Future visit scheduled: yes 06/17/21  Notes to clinic:  pt stated that his BS are in the 300's ask for enough to last until upcoming appt   Requested Prescriptions  Pending Prescriptions Disp Refills   insulin glargine (LANTUS) 100 UNIT/ML Solostar Pen 9 mL 0    Sig: Inject 25 Units into the skin daily for 14 days, THEN 28 Units daily for 14 days.     Endocrinology:  Diabetes - Insulins Failed - 06/05/2021 10:17 AM      Failed - HBA1C is between 0 and 7.9 and within 180 days    HbA1c, POC (controlled diabetic range)  Date Value Ref Range Status  10/30/2020 10.1 (A) 0.0 - 7.0 % Final         Failed - Valid encounter within last 6 months    Recent Outpatient Visits           7 months ago Annual physical exam   Primary Care at [facility], Amy J, NP   10 months ago Encounter to establish care   Primary Care at [facility], Salomon Fick, NP       Future Appointments             In 1 week Rema Fendt, NP Primary Care at [facility]

## 2021-06-05 NOTE — Telephone Encounter (Signed)
Pt asked if he was type 1 diabetic and NT answered no. Pt advised NT that he has been out of Lantus for a while. Pt trying to get approved to go on Medicaid. Refill request sent to [facility] Nurse triage for refill.      Reason for Disposition  [1] Caller has NON-URGENT medicine question about med that PCP prescribed AND [2] triager unable to answer question  Answer Assessment - Initial Assessment Questions 1. DRUG NAME: "What medicine do you need to have refilled?"     Lantus  2. REFILLS REMAINING: "How many refills are remaining?" (Note: The label on the medicine or pill bottle will show how many refills are remaining. If there are no refills remaining, then a renewal may be needed.)     none 3. EXPIRATION DATE: "What is the expiration date?" (Note: The label states when the prescription will expire, and thus can no longer be refilled.)     N/a 4. PRESCRIBING HCP: "Who prescribed it?" Reason: If prescribed by specialist, call should be referred to that group.     Ricky Stabs NP 5. SYMPTOMS: "Do you have any symptoms?"     Fingers tingling  Protocols used: Medication Refill and Renewal Call-A-AH

## 2021-06-05 NOTE — Telephone Encounter (Signed)
Pt requesting refills of the Lantus. Called pharmacy and no refills left.

## 2021-06-05 NOTE — Telephone Encounter (Signed)
  Chief Complaint: Medication refill Symptoms: Calling for update on request for refill of Lantus Frequency:  Pertinent Negatives: Patient denies  Disposition: [] ED /[] Urgent Care (no appt availability in office) / [] Appointment(In office/virtual)/ []  [place] [facility]/ [] Home Care/ [] Refused Recommended Disposition /[] [place] [facility]/ [x]  Follow-up with PCP Additional Notes: Please see earlier encounter, refill request.

## 2021-06-10 NOTE — Telephone Encounter (Signed)
Schedule appointment. Last visit 10/30/2020.

## 2021-06-13 NOTE — Progress Notes (Signed)
Patient ID: [name] [name], male    DOB: 1970/08/06  MRN: [id number]  CC: Chronic Conditions Follow-Up  Subjective: [name] [name] is a 51 y.o. male who presents for chronic conditions follow-up.  His concerns today include:  Diabetes type 2 follow-up: 10/30/2020: - Hemoglobin A1c today not at goal at 10.1%, goal < 7%. However, this is improved from previous of 13.5% on 07/18/2020.  - Continue Metformin as prescribed. No refills needed. - Increase Insulin Glargine from 20 units daily to 25 units for 14 days then 28 units daily continuing.  06/17/2021: Doing well on current regimen. No side effects. Reports looked on Google and seen that Metformin can damage kidney and liver. Therefore, only taking Metformin sometimes. Needs refills of Gabapentin as well. Taking over-the-counter multivitamin, collagen, and magnesium.  2. Hyperlipidemia follow-up: Doing well on Atorvastatin. No issues/concerns.   3. Medication refills: Patient reports he is aware Lamictal and Sertraline managed per Behavioral Health.  4. Disability Paperwork: Planning to apply for disability soon through [employer]    Patient Active Problem List   Diagnosis Date Noted   Hyperlipidemia 10/31/2020   UTI (urinary tract infection) 07/17/2020   Type 2 diabetes mellitus without complication, without long-term current use of insulin ([place]) 10/21/2018   Paresthesia of left upper and lower extremity 10/07/2018   Healthcare maintenance 10/07/2018   Shoulder impingement syndrome, right 10/07/2018     Current Outpatient Medications on File Prior to Visit  Medication Sig Dispense Refill   Accu-Chek Softclix Lancets lancets Use as directed. 100 each 5   Blood Glucose Monitoring Suppl (ACCU-CHEK GUIDE) w/Device KIT Check blood sugar once daily in the morning before taking Lantus 1 kit 0   glucose blood (ACCU-CHEK GUIDE) test strip Use as instructed 100 each 12   ibuprofen (ADVIL) 200 MG tablet Take 400 mg by mouth every 6  (six) hours as needed for fever or moderate pain.  (Patient not taking: Reported on 10/30/2020)     insulin glargine (LANTUS) 100 UNIT/ML injection Inject 20 Units into the skin daily.     insulin glargine (LANTUS) 100 UNIT/ML Solostar Pen Inject 25 Units into the skin daily for 14 days, THEN 28 Units daily for 14 days. (Patient not taking: Reported on 01/26/2021) 9 mL 0   Insulin Pen Needle (TECHLITE PEN NEEDLES) 32G X 4 MM MISC Use 1 pen needle as needed 100 each 0   lamoTRIgine (LAMICTAL) 100 MG tablet Take 1 tablet (100 mg total) by mouth daily. Take one tablet daily 30 tablet 1   metFORMIN (GLUCOPHAGE) 1000 MG tablet Take 1 tablet (1,000 mg total) by mouth daily with breakfast. 90 tablet 3   sertraline (ZOLOFT) 100 MG tablet Take 1 tablet (100 mg total) by mouth daily. 30 tablet 1   No current facility-administered medications on file prior to visit.    No Known Allergies  Social History   Socioeconomic History   Marital status: Single    Spouse name: Not on file   Number of children: Not on file   Years of education: Not on file   Highest education level: Not on file  Occupational History   Not on file  Tobacco Use   Smoking status: Never    Passive exposure: Current   Smokeless tobacco: Current  Vaping Use   Vaping Use: Never used  Substance and Sexual Activity   Alcohol use: Never   Drug use: Never   Sexual activity: Yes  Other Topics Concern   Not on file  Social History Narrative   Not on file   Social Determinants of Health   Financial Resource Strain: Not on file  Food Insecurity: Not on file  Transportation Needs: Not on file  Physical Activity: Not on file  Stress: Not on file  Social Connections: Not on file  Intimate Partner Violence: Not on file    No family history on file.  No past surgical history on file.  ROS: Review of Systems Negative except as stated above  PHYSICAL EXAM: BP 117/74 (BP Location: Left Arm, Patient Position: Sitting,  Cuff Size: Normal)   Pulse 75   Temp 98.3 F (36.8 C)   Resp 16   Ht 5' 7.99" (1.727 m)   Wt 165 lb (74.8 kg)   SpO2 98%   BMI 25.09 kg/m   Physical Exam HENT:     Head: Normocephalic and atraumatic.  Eyes:     Extraocular Movements: Extraocular movements intact.     Conjunctiva/sclera: Conjunctivae normal.     Pupils: Pupils are equal, round, and reactive to light.  Cardiovascular:     Rate and Rhythm: Normal rate and regular rhythm.     Pulses: Normal pulses.     Heart sounds: Normal heart sounds.  Pulmonary:     Effort: Pulmonary effort is normal.     Breath sounds: Normal breath sounds.  Musculoskeletal:     Cervical back: Normal range of motion and neck supple.  Neurological:     General: No focal deficit present.     Mental Status: He is alert and oriented to person, place, and time.  Psychiatric:        Mood and Affect: Mood normal.        Behavior: Behavior normal.    ASSESSMENT AND PLAN: 1. Type 2 diabetes mellitus with hyperglycemia, with long-term current use of insulin (HCC) - Hemoglobin A1c result pending. Will update pharmacological management once results. - Magnesium screening. - Magnesium - Hemoglobin A1c  2. Diabetic mononeuropathy associated with type 2 diabetes mellitus (HCC) - Gabapentin as prescribed. Counseled on medication adherence and adverse effects.  - Follow-up with primary provider as scheduled.  - gabapentin (NEURONTIN) 100 MG capsule; Take 1 capsule (100 mg total) by mouth 3 (three) times daily.  Dispense: 90 capsule; Refill: 0  3. Hyperlipidemia, unspecified hyperlipidemia type - Practice low-fat heart healthy diet and at least 150 minutes of moderate intensity exercise weekly as tolerated.  - Atorvastatin as prescribed.  - Update lipid panel.  - Follow-up with primary provider as scheduled.  - Lipid panel - atorvastatin (LIPITOR) 20 MG tablet; Take 1 tablet (20 mg total) by mouth daily.  Dispense: 120 tablet; Refill: 0  4.  Encounter for completion of form with patient - Patient aware appointment required for paperwork review and completion.   Patient was given the opportunity to ask questions.  Patient verbalized understanding of the plan and was able to repeat key elements of the plan. Patient was given clear instructions to go to Emergency Department or return to medical center if symptoms don't improve, worsen, or new problems develop.The patient verbalized understanding.   Orders Placed This Encounter  Procedures   Lipid panel   Magnesium   Hemoglobin A1c     Requested Prescriptions   Signed Prescriptions Disp Refills   gabapentin (NEURONTIN) 100 MG capsule 90 capsule 0    Sig: Take 1 capsule (100 mg total) by mouth 3 (three) times daily.   atorvastatin (LIPITOR) 20 MG tablet 120 tablet 0  Sig: Take 1 tablet (20 mg total) by mouth daily.    Follow-up with primary provider as scheduled.   Camillia Herter, NP

## 2021-06-17 ENCOUNTER — Ambulatory Visit (INDEPENDENT_AMBULATORY_CARE_PROVIDER_SITE_OTHER): Payer: Self-pay | Admitting: Family

## 2021-06-17 ENCOUNTER — Encounter: Payer: Self-pay | Admitting: Family

## 2021-06-17 ENCOUNTER — Other Ambulatory Visit: Payer: Self-pay

## 2021-06-17 VITALS — BP 117/74 | HR 75 | Temp 98.3°F | Resp 16 | Ht 67.99 in | Wt 165.0 lb

## 2021-06-17 DIAGNOSIS — E1165 Type 2 diabetes mellitus with hyperglycemia: Secondary | ICD-10-CM

## 2021-06-17 DIAGNOSIS — Z0289 Encounter for other administrative examinations: Secondary | ICD-10-CM

## 2021-06-17 DIAGNOSIS — Z794 Long term (current) use of insulin: Secondary | ICD-10-CM

## 2021-06-17 DIAGNOSIS — E1141 Type 2 diabetes mellitus with diabetic mononeuropathy: Secondary | ICD-10-CM

## 2021-06-17 DIAGNOSIS — E785 Hyperlipidemia, unspecified: Secondary | ICD-10-CM

## 2021-06-17 MED ORDER — ATORVASTATIN CALCIUM 20 MG PO TABS
20.0000 mg | ORAL_TABLET | Freq: Every day | ORAL | 0 refills | Status: DC
  Filled 2021-06-17: qty 90, 90d supply, fill #0
  Filled 2021-10-09: qty 30, 30d supply, fill #1

## 2021-06-17 MED ORDER — GABAPENTIN 100 MG PO CAPS
100.0000 mg | ORAL_CAPSULE | Freq: Three times a day (TID) | ORAL | 0 refills | Status: DC
  Filled 2021-06-17: qty 90, 30d supply, fill #0

## 2021-06-17 NOTE — Progress Notes (Signed)
Pt presents for diabetes f/u states has been out of medication for awhile, and needs refill on all of them -Lantus -Sertraline -Atorvastatin  -Gabapentin -Lamictal

## 2021-06-18 ENCOUNTER — Ambulatory Visit (INDEPENDENT_AMBULATORY_CARE_PROVIDER_SITE_OTHER): Payer: No Payment, Other | Admitting: Licensed Clinical Social Worker

## 2021-06-18 DIAGNOSIS — F0630 Mood disorder due to known physiological condition, unspecified: Secondary | ICD-10-CM

## 2021-06-19 ENCOUNTER — Other Ambulatory Visit: Payer: Self-pay | Admitting: Family

## 2021-06-19 ENCOUNTER — Other Ambulatory Visit: Payer: Self-pay

## 2021-06-19 DIAGNOSIS — E1165 Type 2 diabetes mellitus with hyperglycemia: Secondary | ICD-10-CM

## 2021-06-19 LAB — LIPID PANEL
Chol/HDL Ratio: 3.6 ratio (ref 0.0–5.0)
Cholesterol, Total: 200 mg/dL — ABNORMAL HIGH (ref 100–199)
HDL: 55 mg/dL (ref >39)
LDL Chol Calc (NIH): 129 mg/dL — ABNORMAL HIGH (ref 0–99)
Triglycerides: 89 mg/dL (ref 0–149)
VLDL Cholesterol Cal: 16 mg/dL (ref 5–40)

## 2021-06-19 LAB — HEMOGLOBIN A1C
Est. average glucose Bld gHb Est-mCnc: 378 mg/dL
Hgb A1c MFr Bld: 14.8 % — ABNORMAL HIGH (ref 4.8–5.6)

## 2021-06-19 LAB — MAGNESIUM: Magnesium: 2.1 mg/dL (ref 1.6–2.3)

## 2021-06-19 MED ORDER — TECHLITE PEN NEEDLES 32G X 4 MM MISC
1.0000 | 0 refills | Status: AC | PRN
  Filled 2021-06-19: qty 100, 100d supply, fill #0

## 2021-06-19 MED ORDER — METFORMIN HCL 1000 MG PO TABS
1000.0000 mg | ORAL_TABLET | Freq: Every day | ORAL | 0 refills | Status: DC
  Filled 2021-06-19: qty 90, 90d supply, fill #0
  Filled 2021-06-19: qty 30, 30d supply, fill #0

## 2021-06-19 MED ORDER — INSULIN GLARGINE 100 UNIT/ML SOLOSTAR PEN
PEN_INJECTOR | SUBCUTANEOUS | 0 refills | Status: DC
  Filled 2021-06-19 – 2021-06-23 (×2): qty 9, 27d supply, fill #0
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
## 2021-06-23 ENCOUNTER — Other Ambulatory Visit: Payer: Self-pay

## 2021-07-02 ENCOUNTER — Ambulatory Visit (INDEPENDENT_AMBULATORY_CARE_PROVIDER_SITE_OTHER): Payer: Self-pay | Admitting: Licensed Clinical Social Worker

## 2021-07-02 DIAGNOSIS — F0630 Mood disorder due to known physiological condition, unspecified: Secondary | ICD-10-CM

## 2021-07-03 NOTE — Progress Notes (Signed)
Virtual Visit via Video Note  I connected with [name] [name] on 07/03/21 at  4:00 PM EDT by a video enabled telemedicine application and verified that I am speaking with the correct person using two identifiers.  Location: Patient: home Provider: Office   I discussed the limitations of evaluation and management by telemedicine and the availability of in person appointments. The patient expressed understanding and agreed to proceed.   THERAPIST PROGRESS NOTE  Session Time: 4:00 pm-4:45 pm  Type of Therapy: Individual Therapy  Purpose of Session: Gavin will manage mood and anxiety as evidenced by expressing emotions appropriately, coping with daily stressors, and increasing anger management for 5 out of 7 days for 60 days.   Interventions: Therapist utilized CBT and Solution focused brief therapy to address mood. Therapist provided support and empathy to patient during session. Therapist provided anger management, and worked with patient on shifting his thoughts/perspective.   Effectiveness: Patient was oriented x4 (person, place, situation, and time). Patient was casually dressed, and appropriately groomed. Patient was alert, engaged, pleasant, and cooperative. Patient noted that his fiance gets upset with him. She brings up he past often, and feels like he either talks too much or intelligent or he doesn't talk to much. Patient feels like he can't win with her. Patient is trying to stop talking or responding when he realizes she is getting upset. He is also focused on walking away if it looks like the situation will not be productive or end well due to anger. Patient is trying to do things his girlfriend wants to do even if he doesn't necessarily want to do it.   Patient engaged in session. Patient responded well to interventions. 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 Patient will continue in outpatient therapy due to being the least  restrictive service to meet his needs. Patient made minimal progress on his goals at this time.   Suicidal/Homicidal: Nowithout intent/plan  Plan: Return again in 2-4 weeks.  Diagnosis: Mood disorder in conditions classified elsewhere  Collaboration of Care: Psychiatrist AEB Dr. Kalman Jewels  Patient/Guardian was advised Release of Information must be obtained prior to any record release in order to collaborate their care with an outside provider. Patient/Guardian was advised if they have not already done so to contact the registration department to sign all necessary forms in order for [place] to release information regarding their care.   Consent: Patient/Guardian gives verbal consent for treatment and assignment of benefits for services provided during this visit. Patient/Guardian expressed understanding and agreed to proceed.   I discussed the assessment and treatment plan with the patient. The patient was provided an opportunity to ask questions and all were answered. The patient agreed with the plan and demonstrated an understanding of the instructions.   The patient was advised to call back or seek an in-person evaluation if the symptoms worsen or if the condition fails to improve as anticipated.  I provided 45 minutes of non-face-to-face time during this encounter.  Bynum Bellows, LCSW 07/03/2021

## 2021-07-14 ENCOUNTER — Ambulatory Visit (HOSPITAL_COMMUNITY): Payer: Self-pay | Admitting: Licensed Clinical Social Worker

## 2021-07-16 ENCOUNTER — Other Ambulatory Visit: Payer: Self-pay

## 2021-07-23 ENCOUNTER — Other Ambulatory Visit: Payer: Self-pay | Admitting: Family

## 2021-07-23 ENCOUNTER — Other Ambulatory Visit: Payer: Self-pay

## 2021-07-23 DIAGNOSIS — E1165 Type 2 diabetes mellitus with hyperglycemia: Secondary | ICD-10-CM

## 2021-07-23 MED ORDER — LANTUS SOLOSTAR 100 UNIT/ML ~~LOC~~ SOPN
33.0000 [IU] | PEN_INJECTOR | Freq: Every day | SUBCUTANEOUS | 0 refills | Status: DC
  Filled 2021-07-23: qty 9, 27d supply, fill #0

## 2021-07-23 NOTE — Telephone Encounter (Signed)
Refilled as requested. Remind patient imperative to keep appointment scheduled 08/06/2021 with Ronny Bacon, NP at Endocrinology.

## 2021-07-28 ENCOUNTER — Ambulatory Visit (HOSPITAL_COMMUNITY): Payer: Self-pay | Admitting: Licensed Clinical Social Worker

## 2021-07-29 ENCOUNTER — Encounter (HOSPITAL_COMMUNITY): Payer: Self-pay | Admitting: Licensed Clinical Social Worker

## 2021-07-29 ENCOUNTER — Other Ambulatory Visit: Payer: Self-pay

## 2021-08-06 ENCOUNTER — Ambulatory Visit (INDEPENDENT_AMBULATORY_CARE_PROVIDER_SITE_OTHER): Payer: Self-pay | Admitting: Nurse Practitioner

## 2021-08-06 ENCOUNTER — Encounter: Payer: Self-pay | Admitting: Nurse Practitioner

## 2021-08-06 ENCOUNTER — Other Ambulatory Visit: Payer: Self-pay

## 2021-08-06 VITALS — BP 113/77 | HR 80 | Ht 67.99 in | Wt 165.0 lb

## 2021-08-06 DIAGNOSIS — I10 Essential (primary) hypertension: Secondary | ICD-10-CM

## 2021-08-06 DIAGNOSIS — E1165 Type 2 diabetes mellitus with hyperglycemia: Secondary | ICD-10-CM

## 2021-08-06 DIAGNOSIS — E782 Mixed hyperlipidemia: Secondary | ICD-10-CM

## 2021-08-06 DIAGNOSIS — Z794 Long term (current) use of insulin: Secondary | ICD-10-CM

## 2021-08-06 MED ORDER — LANTUS SOLOSTAR 100 UNIT/ML ~~LOC~~ SOPN
30.0000 [IU] | PEN_INJECTOR | Freq: Every day | SUBCUTANEOUS | 3 refills | Status: DC
  Filled 2021-08-06: qty 9, 30d supply, fill #0
  Filled 2021-09-09: qty 9, 30d supply, fill #1
  Filled 2021-10-09 – 2021-10-20 (×2): qty 9, 30d supply, fill #2
  Filled 2022-01-02: qty 30, 100d supply, fill #3
  Filled 2022-01-02: qty 9, 30d supply, fill #3
  Filled 2022-04-24: qty 9, 30d supply, fill #4

## 2021-08-06 MED ORDER — METFORMIN HCL 500 MG PO TABS
500.0000 mg | ORAL_TABLET | Freq: Two times a day (BID) | ORAL | 3 refills | Status: DC
  Filled 2021-08-06: qty 60, 30d supply, fill #0
  Filled 2021-09-09: qty 60, 30d supply, fill #1
  Filled 2021-10-09 – 2021-10-20 (×2): qty 60, 30d supply, fill #2
  Filled 2022-01-02: qty 60, 30d supply, fill #3
  Filled 2022-04-24: qty 60, 30d supply, fill #4
  Filled 2022-07-14: qty 60, 30d supply, fill #5

## 2021-08-06 NOTE — Patient Instructions (Signed)

## 2021-08-06 NOTE — Progress Notes (Signed)
Endocrinology Consult Note       08/06/2021, 11:22 AM   Subjective:    Patient ID: [name] [name], male    DOB: 12/21/1970.  [name] [name] is being seen in consultation for management of currently uncontrolled symptomatic diabetes requested by  Camillia Herter, NP.   Past Medical History:  Diagnosis Date   Diabetes mellitus without complication ([place])    Diabetes mellitus, type II ([place])    Hyperlipidemia     History reviewed. No pertinent surgical history.  Social History   Socioeconomic History   Marital status: Single    Spouse name: Not on file   Number of children: Not on file   Years of education: Not on file   Highest education level: Not on file  Occupational History   Not on file  Tobacco Use   Smoking status: Never    Passive exposure: Current   Smokeless tobacco: Current  Vaping Use   Vaping Use: Never used  Substance and Sexual Activity   Alcohol use: Never   Drug use: Never   Sexual activity: Yes  Other Topics Concern   Not on file  Social History Narrative   Not on file   Social Determinants of Health   Financial Resource Strain: Not on file  Food Insecurity: Not on file  Transportation Needs: Not on file  Physical Activity: Not on file  Stress: Not on file  Social Connections: Not on file    Family History  Problem Relation Age of Onset   Diabetes Paternal Grandmother    Diabetes Paternal Grandfather    Stroke Paternal Grandfather     Outpatient Encounter Medications as of 08/06/2021  Medication Sig   Accu-Chek Softclix Lancets lancets Use as directed.   atorvastatin (LIPITOR) 20 MG tablet Take 1 tablet (20 mg total) by mouth once daily.   Blood Glucose Monitoring Suppl (ACCU-CHEK GUIDE) w/Device KIT Check blood sugar once daily in the morning before taking Lantus   gabapentin (NEURONTIN) 100 MG capsule Take 1 capsule (100 mg total) by mouth 3 (three) times daily.    glucose blood (ACCU-CHEK GUIDE) test strip Use as instructed   ibuprofen (ADVIL) 200 MG tablet Take 400 mg by mouth every 6 (six) hours as needed for fever or moderate pain.  (Patient not taking: Reported on 10/30/2020)   insulin glargine (LANTUS SOLOSTAR) 100 UNIT/ML Solostar Pen Inject 30 Units into the skin at bedtime.   Insulin Pen Needle (TECHLITE PEN NEEDLES) 32G X 4 MM MISC Use as directed daily.   lamoTRIgine (LAMICTAL) 100 MG tablet Take 1 tablet (100 mg total) by mouth daily. Take one tablet daily   metFORMIN (GLUCOPHAGE) 500 MG tablet Take 1 tablet (500 mg total) by mouth 2 (two) times daily with a meal.   sertraline (ZOLOFT) 100 MG tablet Take 1 tablet (100 mg total) by mouth daily.   [DISCONTINUED] insulin glargine (LANTUS SOLOSTAR) 100 UNIT/ML Solostar Pen Inject 33 Units into the skin daily. (Patient taking differently: Inject 25 Units into the skin daily.)   [DISCONTINUED] metFORMIN (GLUCOPHAGE) 1000 MG tablet Take 1 tablet (1,000 mg total) by mouth daily with breakfast.   No facility-administered encounter medications on file as of  08/06/2021.    ALLERGIES: No Known Allergies  VACCINATION STATUS:  There is no immunization history on file for this patient.  Diabetes He presents for his initial diabetic visit. He has type 2 diabetes mellitus. Onset time: Diagnosed at approx age of 50. There are no hypoglycemic associated symptoms. Associated symptoms include blurred vision, fatigue, polydipsia and polyuria. There are no hypoglycemic complications. Diabetic complications include impotence. Risk factors for coronary artery disease include diabetes mellitus, dyslipidemia, family history, male sex and hypertension. Current diabetic treatment includes insulin injections and oral agent (monotherapy). He is compliant with treatment most of the time. His weight is stable. He is following a generally unhealthy diet. When asked about meal planning, he reported none. He has not had a previous  visit with a dietitian. He participates in exercise intermittently. His overall blood glucose range is >200 mg/dl. (He presents today for his consultation with no meter or logs to review.  His most recent A1c on 6/6 was 14.8%.  He was diagnosed with diabetes 2 years or so ago.  He notes he used to eat healthy and work out daily while he was incarcerated but has not been as vigilant since released.  He monitors glucose 2-3 times daily.  He drinks mostly water (sometimes flavored with SF flavorings), SF lemonade, tea, and milk.  He eats 3 meals per day plus snacks.  He walks daily for exercise.  He has never seen podiatry in the past, is due for eye exam (has not had one in a long time).) An ACE inhibitor/angiotensin II receptor blocker is not being taken. He does not see a podiatrist.Eye exam is not current.     Review of systems  Constitutional: + Minimally fluctuating body weight, current Body mass index is 25.1 kg/m., + fatigue, no subjective hyperthermia, no subjective hypothermia Eyes: + blurry vision, no xerophthalmia ENT: no sore throat, no nodules palpated in throat, no dysphagia/odynophagia, no hoarseness Cardiovascular: no chest pain, no shortness of breath, no palpitations, no leg swelling Respiratory: no cough, no shortness of breath Gastrointestinal: no nausea/vomiting/diarrhea Genitourinary: + polyuria, trouble reaching climax during intercourse Musculoskeletal: no muscle/joint aches Skin: no rashes, no hyperemia Neurological: no tremors, no numbness, no tingling, no dizziness Psychiatric: no depression, no anxiety, does have mood swings  Objective:     BP 113/77   Pulse 80   Ht 5' 7.99" (1.727 m)   Wt 165 lb (74.8 kg)   BMI 25.10 kg/m   Wt Readings from Last 3 Encounters:  08/06/21 165 lb (74.8 kg)  06/17/21 165 lb (74.8 kg)  01/28/21 165 lb 5.5 oz (75 kg)     BP Readings from Last 3 Encounters:  08/06/21 113/77  06/17/21 117/74  02/02/21 129/88     Physical  Exam- Limited  Constitutional:  Body mass index is 25.1 kg/m. , not in acute distress, normal state of mind Eyes:  EOMI, no exophthalmos Neck: Supple Cardiovascular: RRR, + faint murmur, rubs, or gallops, no edema Respiratory: Adequate breathing efforts, no crackles, rales, rhonchi, or wheezing Musculoskeletal: no gross deformities, strength intact in all four extremities, no gross restriction of joint movements Skin:  no rashes, no hyperemia Neurological: no tremor with outstretched hands    CMP ( most recent) CMP     Component Value Date/Time   NA 127 (L) 01/26/2021 2201   NA 138 07/31/2020 1046   K 4.3 01/26/2021 2201   CL 96 (L) 01/26/2021 2201   CO2 24 01/26/2021 2201   GLUCOSE 501 (HH)  01/26/2021 2201   BUN 10 01/26/2021 2201   BUN 10 07/31/2020 1046   CREATININE 1.04 01/26/2021 2201   CALCIUM 8.2 (L) 01/26/2021 2201   PROT 6.7 10/30/2020 1322   ALBUMIN 4.4 10/30/2020 1322   AST 18 10/30/2020 1322   ALT 17 10/30/2020 1322   ALKPHOS 56 10/30/2020 1322   BILITOT 0.4 10/30/2020 1322   GFRNONAA >60 01/26/2021 2201   GFRAA >60 09/10/2019 0533     Diabetic Labs (most recent): Lab Results  Component Value Date   HGBA1C 14.8 (H) 06/17/2021   HGBA1C 10.1 (A) 10/30/2020   HGBA1C 13.5 (H) 07/18/2020     Lipid Panel ( most recent) Lipid Panel     Component Value Date/Time   CHOL 200 (H) 06/17/2021 1440   TRIG 89 06/17/2021 1440   HDL 55 06/17/2021 1440   CHOLHDL 3.6 06/17/2021 1440   CHOLHDL 6.3 07/18/2020 0224   VLDL 28 07/18/2020 0224   LDLCALC 129 (H) 06/17/2021 1440   LABVLDL 16 06/17/2021 1440      Lab Results  Component Value Date   TSH 1.730 10/30/2020   TSH 1.320 10/07/2018           Assessment & Plan:   1) Type 2 diabetes mellitus with hyperglycemia, with long-term current use of insulin ([place])  He presents today for his consultation with no meter or logs to review.  His most recent A1c on 6/6 was 14.8%.  He was diagnosed with diabetes 2  years or so ago.  He notes he used to eat healthy and work out daily while he was incarcerated but has not been as vigilant since released.  He monitors glucose 2-3 times daily.  He drinks mostly water (sometimes flavored with SF flavorings), SF lemonade, tea, and milk.  He eats 3 meals per day plus snacks.  He walks daily for exercise.  He has never seen podiatry in the past, is due for eye exam (has not had one in a long time).  - [name] [name] has currently uncontrolled symptomatic type 2 DM since 51 years of age, with most recent A1c of 14.8 %.   -Recent labs reviewed.  - I had a long discussion with him about the progressive nature of diabetes and the pathology behind its complications. -his diabetes is not currently complicated but he remains at a high risk for more acute and chronic complications which include CAD, CVA, CKD, retinopathy, and neuropathy. These are all discussed in detail with him.  The following Lifestyle Medicine recommendations according to [facility]) were discussed and offered to patient and he agrees to start the journey:  A. Whole Foods, Plant-based plate comprising of fruits and vegetables, plant-based proteins, whole-grain carbohydrates was discussed in detail with the patient.   A list for source of those nutrients were also provided to the patient.  Patient will use only water or unsweetened tea for hydration. B.  The need to stay away from risky substances including alcohol, smoking; obtaining 7 to 9 hours of restorative sleep, at least 150 minutes of moderate intensity exercise weekly, the importance of healthy social connections,  and stress reduction techniques were discussed. C.  A full color page of  Calorie density of various food groups per pound showing examples of each food groups was provided to the patient.  - I have counseled him on diet and weight management by adopting a carbohydrate restricted/protein rich diet. Patient  is encouraged to switch to unprocessed or minimally  processed complex starch and increased protein intake (animal or plant source), fruits, and vegetables. -  he is advised to stick to a routine mealtimes to eat 3 meals a day and avoid unnecessary snacks (to snack only to correct hypoglycemia).   - he acknowledges that there is a room for improvement in his food and drink choices. - Suggestion is made for him to avoid simple carbohydrates from his diet including Cakes, Sweet Desserts, Ice Cream, Soda (diet and regular), Sweet Tea, Candies, Chips, Cookies, Store Bought Juices, Alcohol in Excess of 1-2 drinks a day, Artificial Sweeteners, Coffee Creamer, and "Sugar-free" Products. This will help patient to have more stable blood glucose profile and potentially avoid unintended weight gain.  - I have approached him with the following individualized plan to manage his diabetes and patient agrees:   -He is advised to increase his Lantus to 30 units SQ nightly (changing from morning administration).  -he is encouraged to start monitoring glucose 4 times daily, before meals and before bed, to log their readings on the clinic sheets provided, and bring them to review at follow up appointment in 2 weeks.  - he is warned not to take insulin without proper monitoring per orders. - Adjustment parameters are given to him for hypo and hyperglycemia in writing. - he is encouraged to call clinic for blood glucose levels less than 70 or above 300 mg /dl. - he is advised to continue Metformin at lower dose of 500 mg po twice daily after meals, therapeutically suitable for patient .  - he is not an ideal candidate for incretin therapy due to body habitus with BMI of 25.  - Specific targets for  A1c; LDL, HDL, and Triglycerides were discussed with the patient.  2) Blood Pressure /Hypertension:  his blood pressure is controlled to target without the use of antihypertensives.     3) Lipids/Hyperlipidemia:     Review of his recent lipid panel from 06/17/21 showed uncontrolled LDL at 129 .  he is advised to continue Lipitor 20 mg daily at bedtime.  Side effects and precautions discussed with him.  4)  Weight/Diet:  his Body mass index is 25.1 kg/m.  -  he is NOT a candidate for weight loss.   Exercise, and detailed carbohydrates information provided  -  detailed on discharge instructions.  5) Chronic Care/Health Maintenance: -he is not on ACEI/ARB and is on Statin medications and is encouraged to initiate and continue to follow up with Ophthalmology, Dentist, Podiatrist at least yearly or according to recommendations, and advised to stay away from smoking. I have recommended yearly flu vaccine and pneumonia vaccine at least every 5 years; moderate intensity exercise for up to 150 minutes weekly; and sleep for at least 7 hours a day.  - he is advised to maintain close follow up with Camillia Herter, NP for primary care needs, as well as his other providers for optimal and coordinated care.   - Time spent in this patient care: 60 min, of which > 50% was spent in counseling him about his diabetes and the rest reviewing his blood glucose logs, discussing his hypoglycemia and hyperglycemia episodes, reviewing his current and previous labs/studies (including abstraction from other facilities) and medications doses and developing a long term treatment plan based on the latest standards of care/guidelines; and documenting his care.    Please refer to Patient Instructions for Blood Glucose Monitoring and Insulin/Medications Dosing Guide" in media tab for additional information. Please also refer to "Patient  Self Inventory" in the Media tab for reviewed elements of pertinent patient history.  [name] [name] participated in the discussions, expressed understanding, and voiced agreement with the above plans.  All questions were answered to his satisfaction. he is encouraged to contact clinic should he have any questions  or concerns prior to his return visit.     Follow up plan: - Return in about 2 weeks (around 08/20/2021) for Diabetes F/U, Bring meter and logs.    Rayetta Pigg, [facility] [address] [place], [place] [address] Phone: [phone] Fax: [phone]  08/06/2021, 11:22 AM

## 2021-08-07 ENCOUNTER — Other Ambulatory Visit: Payer: Self-pay

## 2021-08-20 ENCOUNTER — Ambulatory Visit: Payer: Medicaid Other | Admitting: Nurse Practitioner

## 2021-09-09 ENCOUNTER — Other Ambulatory Visit: Payer: Self-pay

## 2021-09-09 ENCOUNTER — Other Ambulatory Visit: Payer: Self-pay | Admitting: Family

## 2021-09-09 DIAGNOSIS — E1141 Type 2 diabetes mellitus with diabetic mononeuropathy: Secondary | ICD-10-CM

## 2021-09-09 NOTE — Telephone Encounter (Signed)
Requested medications are due for refill today.  Unsure  Requested medications are on the active medications list.  yes  Last refill. 06/17/2021 #90 0 refills  Future visit scheduled.   no  Notes to clinic.  Rx written to expire 07/23/2021 - Rx is expired.     Requested Prescriptions  Pending Prescriptions Disp Refills   gabapentin (NEURONTIN) 100 MG capsule 90 capsule 0    Sig: Take 1 capsule (100 mg total) by mouth 3 (three) times daily.     Neurology: Anticonvulsants - gabapentin Passed - 09/09/2021 12:55 PM      Passed - Cr in normal range and within 360 days    Creatinine, Ser  Date Value Ref Range Status  01/26/2021 1.04 0.61 - 1.24 mg/dL Final         Passed - Completed PHQ-2 or PHQ-9 in the last 360 days      Passed - Valid encounter within last 12 months    Recent Outpatient Visits           2 months ago Type 2 diabetes mellitus with hyperglycemia, with long-term current use of insulin [facility])   Primary Care at [facility], Amy J, NP   10 months ago Annual physical exam   Primary Care at [facility], Amy J, NP   1 year ago Encounter to establish care   Primary Care at [facility], Salomon Fick, NP

## 2021-09-11 ENCOUNTER — Other Ambulatory Visit: Payer: Self-pay

## 2021-09-11 MED ORDER — GABAPENTIN 100 MG PO CAPS
100.0000 mg | ORAL_CAPSULE | Freq: Three times a day (TID) | ORAL | 0 refills | Status: DC
  Filled 2021-09-11: qty 90, 30d supply, fill #0

## 2021-10-09 ENCOUNTER — Other Ambulatory Visit: Payer: Self-pay

## 2021-10-09 ENCOUNTER — Other Ambulatory Visit: Payer: Self-pay | Admitting: Family

## 2021-10-09 ENCOUNTER — Other Ambulatory Visit (HOSPITAL_COMMUNITY): Payer: Self-pay | Admitting: Psychiatry

## 2021-10-09 DIAGNOSIS — F419 Anxiety disorder, unspecified: Secondary | ICD-10-CM

## 2021-10-09 DIAGNOSIS — E1141 Type 2 diabetes mellitus with diabetic mononeuropathy: Secondary | ICD-10-CM

## 2021-10-09 NOTE — Telephone Encounter (Signed)
Requested medication (s) are due for refill today: rx ended  Requested medication (s) are on the active medication list: yes but expired  Last refill:  09/11/21 #90  ended  10/11/21  Future visit scheduled: no  Notes to clinic:  prescription ended 10/11/21-does this need to be renewed   Requested Prescriptions  Pending Prescriptions Disp Refills   gabapentin (NEURONTIN) 100 MG capsule 90 capsule 0    Sig: Take 1 capsule (100 mg total) by mouth 3 (three) times daily.     Neurology: Anticonvulsants - gabapentin Passed - 10/09/2021  1:40 PM      Passed - Cr in normal range and within 360 days    Creatinine, Ser  Date Value Ref Range Status  01/26/2021 1.04 0.61 - 1.24 mg/dL Final         Passed - Completed PHQ-2 or PHQ-9 in the last 360 days      Passed - Valid encounter within last 12 months    Recent Outpatient Visits           3 months ago Type 2 diabetes mellitus with hyperglycemia, with long-term current use of insulin [facility])   Primary Care at [facility], Amy J, NP   11 months ago Annual physical exam   Primary Care at [facility], Amy J, NP   1 year ago Encounter to establish care   Primary Care at [facility], Flonnie Hailstone, NP

## 2021-10-10 ENCOUNTER — Other Ambulatory Visit: Payer: Self-pay

## 2021-10-16 ENCOUNTER — Other Ambulatory Visit: Payer: Self-pay

## 2021-10-16 MED ORDER — GABAPENTIN 100 MG PO CAPS
100.0000 mg | ORAL_CAPSULE | Freq: Three times a day (TID) | ORAL | 0 refills | Status: DC
  Filled 2021-10-16: qty 90, 30d supply, fill #0

## 2021-10-20 ENCOUNTER — Other Ambulatory Visit: Payer: Self-pay

## 2022-01-02 ENCOUNTER — Other Ambulatory Visit: Payer: Self-pay

## 2022-01-02 ENCOUNTER — Other Ambulatory Visit: Payer: Self-pay | Admitting: Family

## 2022-01-02 DIAGNOSIS — E1141 Type 2 diabetes mellitus with diabetic mononeuropathy: Secondary | ICD-10-CM

## 2022-01-02 MED ORDER — GABAPENTIN 100 MG PO CAPS
100.0000 mg | ORAL_CAPSULE | Freq: Three times a day (TID) | ORAL | 0 refills | Status: DC
  Filled 2022-01-02: qty 90, 30d supply, fill #0

## 2022-01-02 NOTE — Telephone Encounter (Signed)
Requested Prescriptions  Pending Prescriptions Disp Refills   gabapentin (NEURONTIN) 100 MG capsule 90 capsule 0    Sig: Take 1 capsule (100 mg total) by mouth 3 (three) times daily.     Neurology: Anticonvulsants - gabapentin Passed - 01/02/2022  3:40 PM      Passed - Cr in normal range and within 360 days    Creatinine, Ser  Date Value Ref Range Status  01/26/2021 1.04 0.61 - 1.24 mg/dL Final         Passed - Completed PHQ-2 or PHQ-9 in the last 360 days      Passed - Valid encounter within last 12 months    Recent Outpatient Visits           6 months ago Type 2 diabetes mellitus with hyperglycemia, with long-term current use of insulin [facility])   Primary Care at [facility], [place], NP   1 year ago Annual physical exam   Primary Care at [facility], Amy J, NP   1 year ago Encounter to establish care   Primary Care at [facility], Salomon Fick, NP

## 2022-01-29 IMAGING — CR DG CHEST 1V
1 series · 1 of 1 positions shown · non-contrast
Comparison: 09/09/2019

CLINICAL DATA: Chest pain

EXAM:
CHEST  1 VIEW

[chest pa]
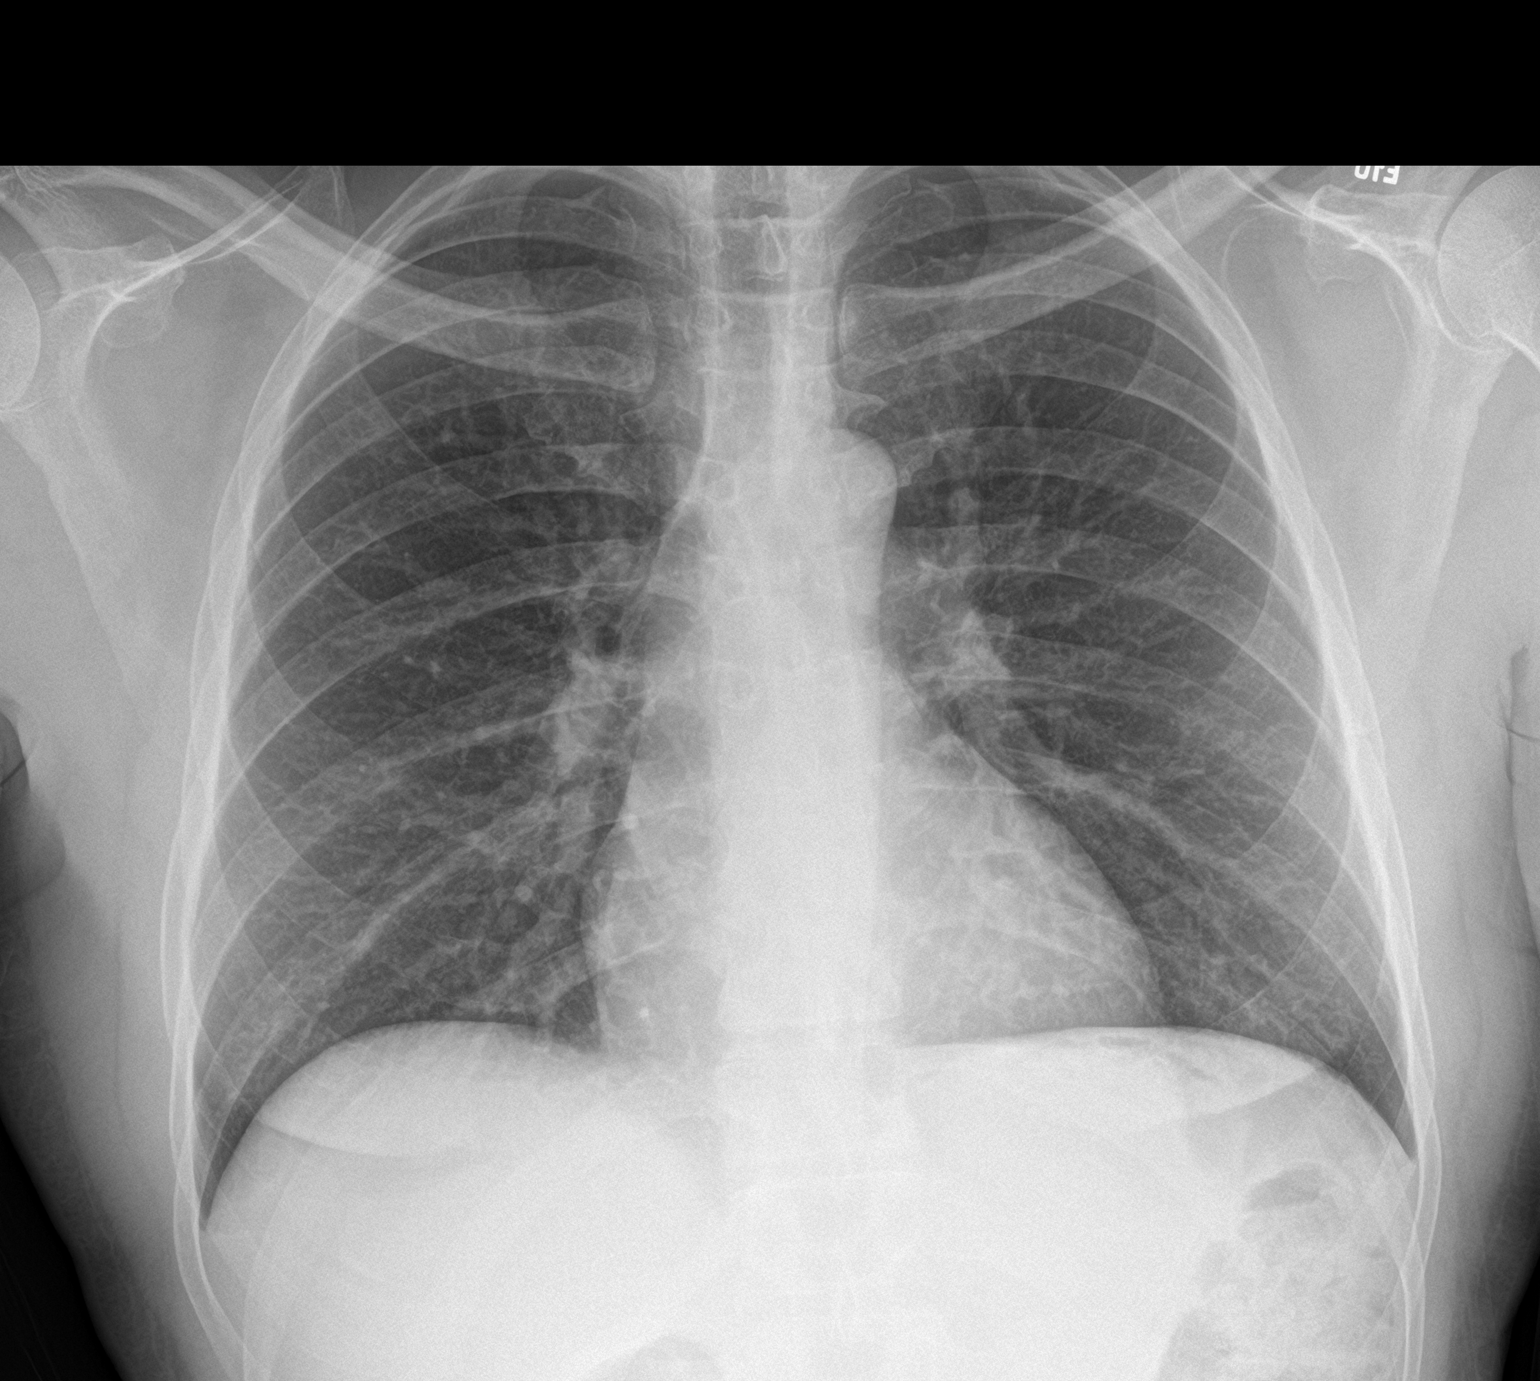

[1 of 1 positions shown; findings below may reference images not displayed]

FINDINGS: The heart size and mediastinal contours are within normal limits.
Both lungs are clear. The visualized skeletal structures are
unremarkable.
IMPRESSION: No active disease.

## 2022-01-29 IMAGING — CT CT RENAL STONE PROTOCOL
2 of 4 series · 16 of 46 positions shown, 18 images · non-contrast
Comparison: None.

CLINICAL DATA: Flank pain. Laterality is not indicated. High blood
sugar and generalized aches and pains.

EXAM:
CT ABDOMEN AND PELVIS WITHOUT CONTRAST
TECHNIQUE: Multidetector CT imaging of the abdomen and pelvis was performed
following the standard protocol without IV contrast.

[Series 3: stone study 5.0 i30f 2 · axial · 0.69mm/px · z∈[+939,+1354]mm · 13 of 91 slices shown, 15 images]
[im 4/91  soft-tissue]
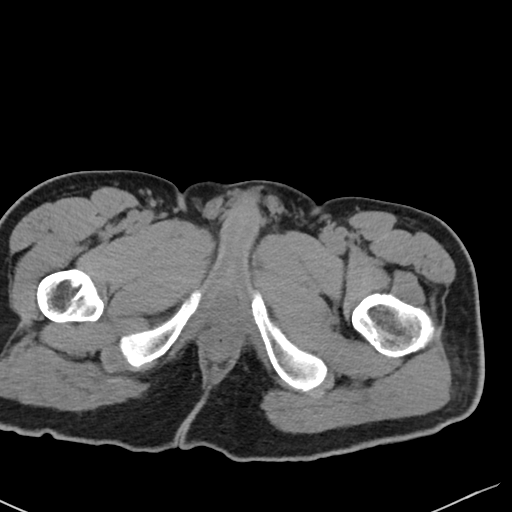
[im 4/91  bone]
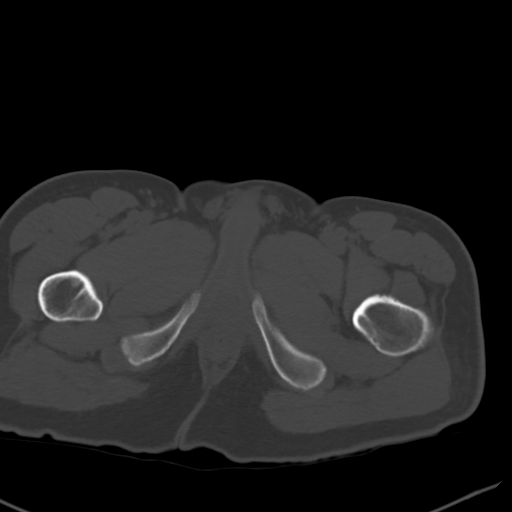
[im 11/91  soft-tissue]
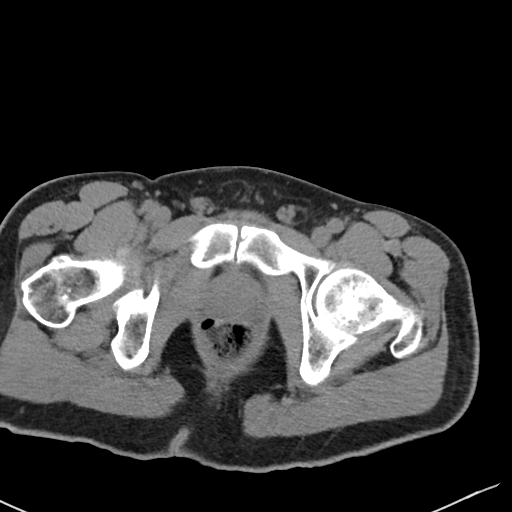
[im 19/91  soft-tissue]
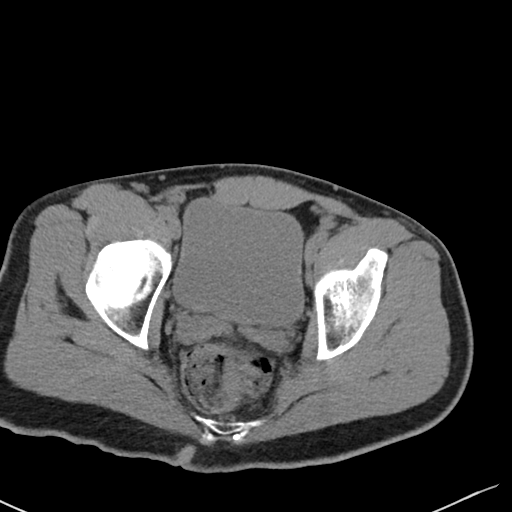
[im 26/91  soft-tissue]
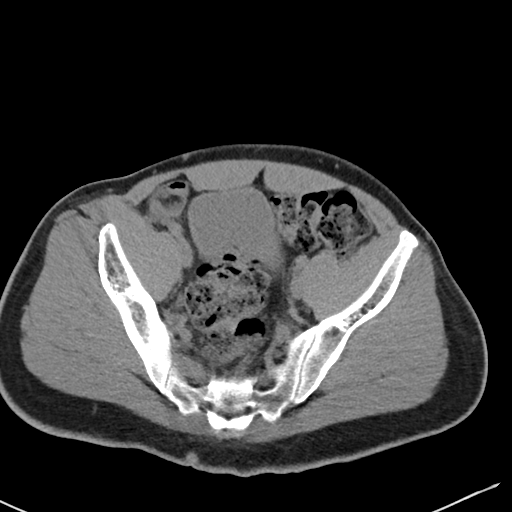
[im 33/91  soft-tissue]
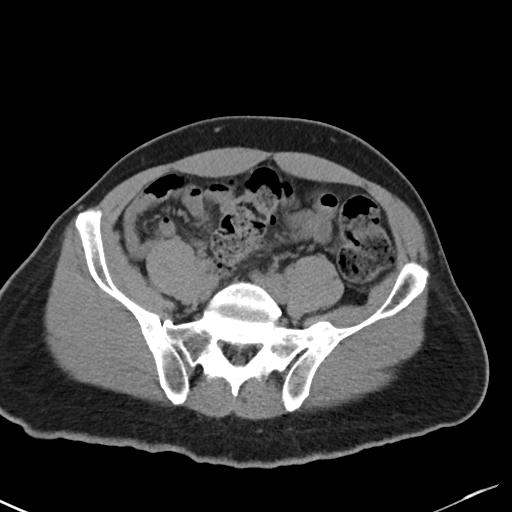
[im 40/91  soft-tissue]
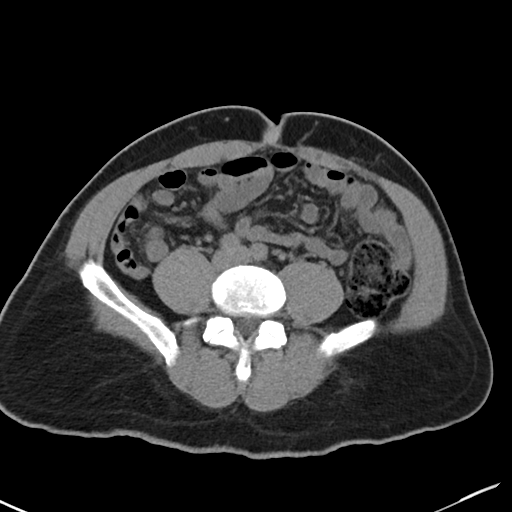
[im 47/91  soft-tissue]
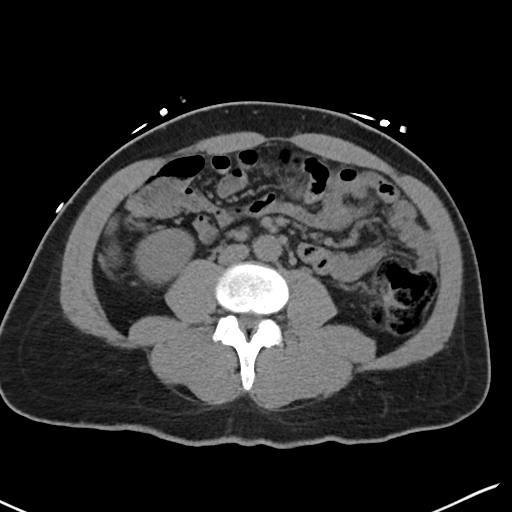
[im 51/91  soft-tissue]
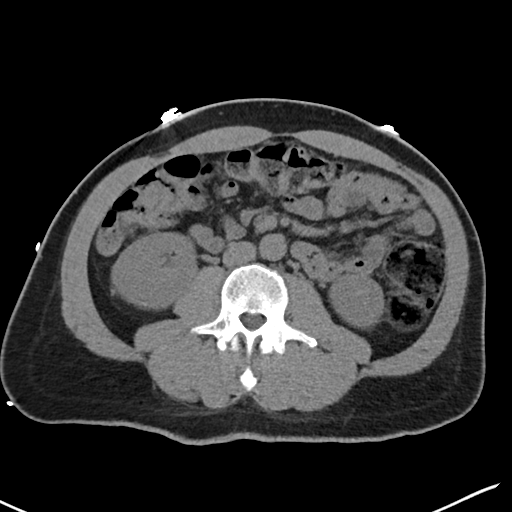
[im 58/91  soft-tissue]
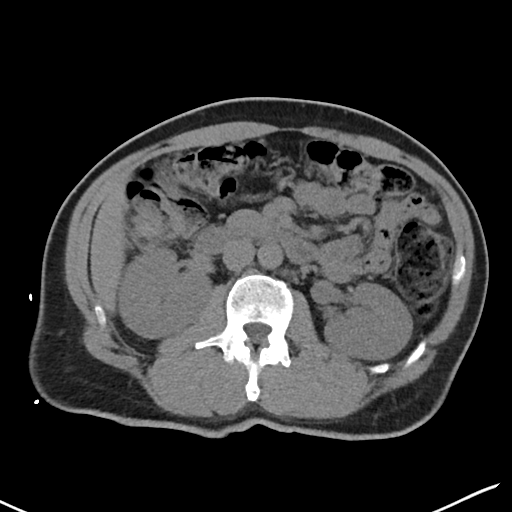
[im 58/91  bone]
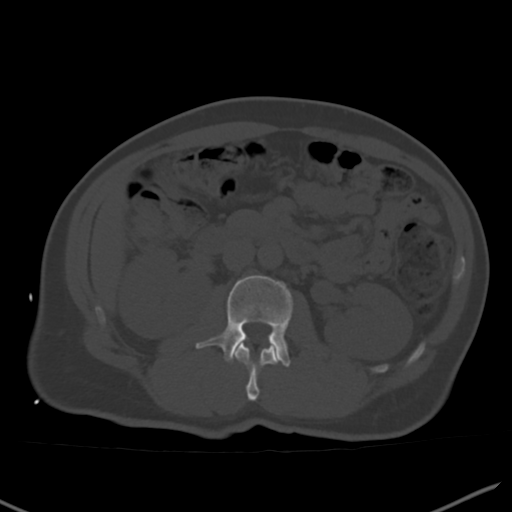
[im 65/91  soft-tissue]
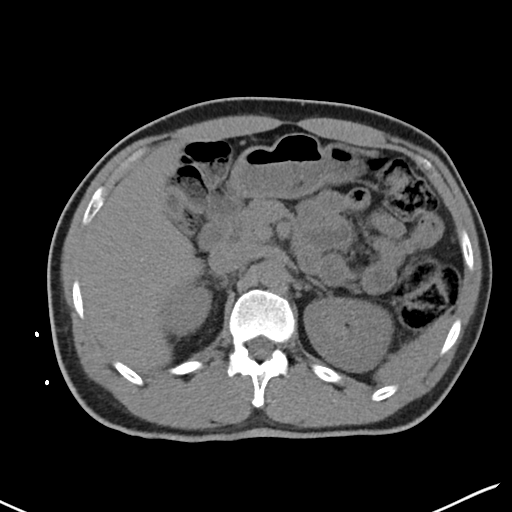
[im 73/91  soft-tissue]
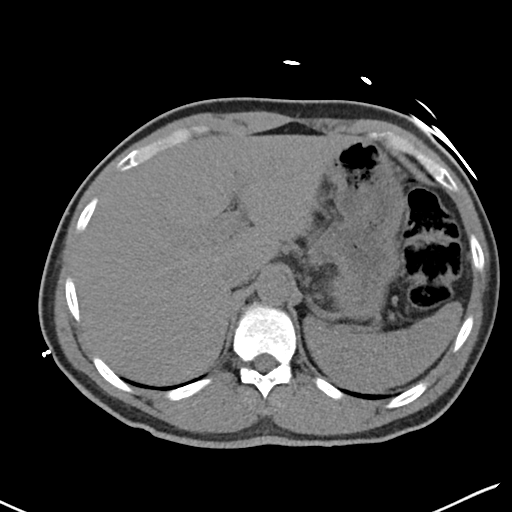
[im 80/91  soft-tissue]
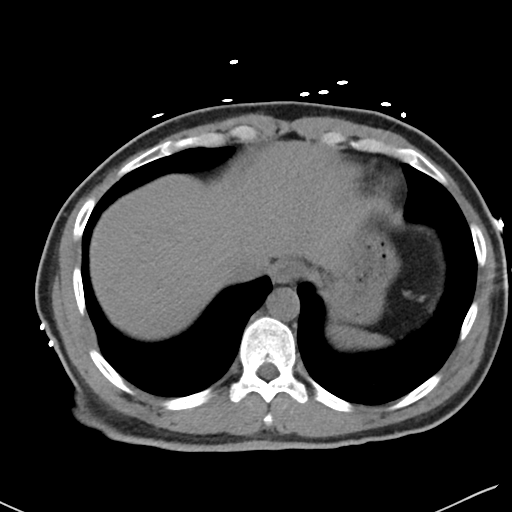
[im 87/91  soft-tissue]
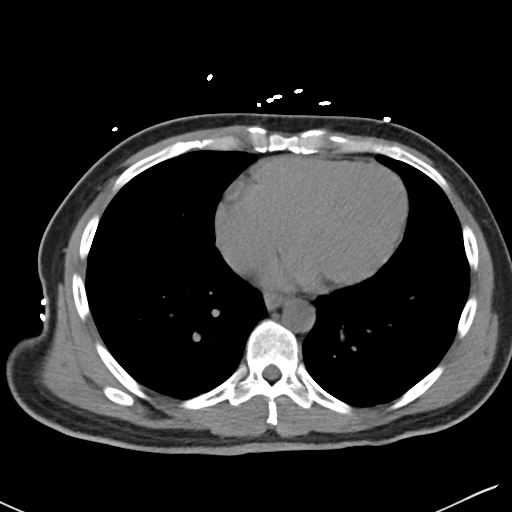

[Series 5: coronal soft tissue · coronal · 0.71mm/px · 3 of 92 slices shown]
[im 31/92  soft-tissue]
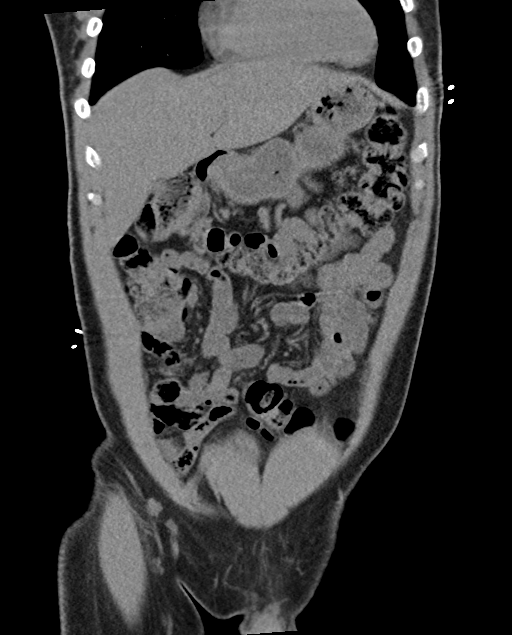
[im 41/92  soft-tissue]
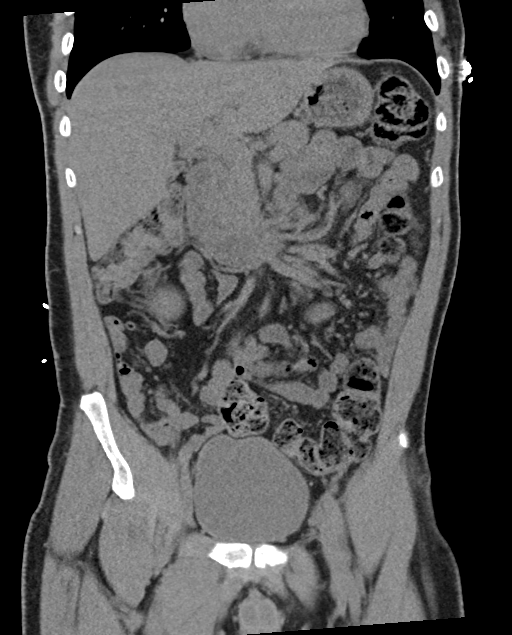
[im 51/92  soft-tissue]
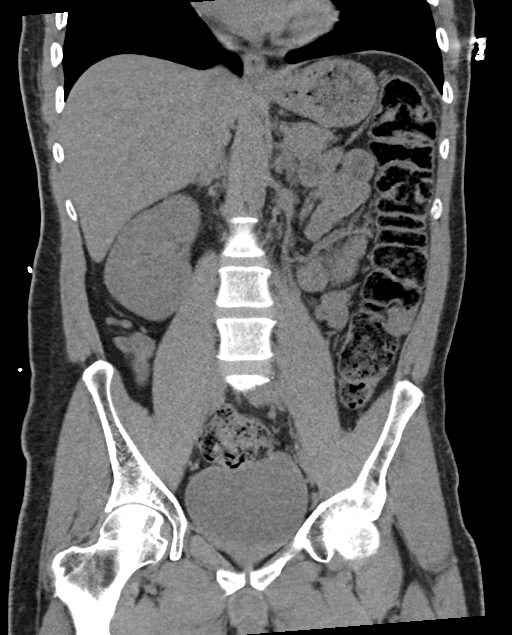

[16 of 46 positions shown; findings below may reference images not displayed]

FINDINGS: Lower chest: Lung bases are clear.

Hepatobiliary: No focal liver abnormality is seen. No gallstones,
gallbladder wall thickening, or biliary dilatation.

Pancreas: Unremarkable. No pancreatic ductal dilatation or
surrounding inflammatory changes.

Spleen: Normal in size without focal abnormality.

Adrenals/Urinary Tract: Adrenal glands are unremarkable. Kidneys are
normal, without renal calculi, focal lesion, or hydronephrosis.
Bladder is unremarkable.

Stomach/Bowel: Stomach, small bowel, and colon are not abnormally
distended. Stool fills the colon. No wall thickening or inflammatory
changes are appreciated. Appendix is normal.

Vascular/Lymphatic: No significant vascular findings are present. No
enlarged abdominal or pelvic lymph nodes.

Reproductive: Prostate is unremarkable.

Other: No abdominal wall hernia or abnormality. No abdominopelvic
ascites.

Musculoskeletal: No acute or significant osseous findings.
IMPRESSION: No renal or ureteral stone or obstruction.

## 2022-03-20 ENCOUNTER — Other Ambulatory Visit: Payer: Self-pay

## 2022-04-24 ENCOUNTER — Other Ambulatory Visit: Payer: Self-pay | Admitting: Family

## 2022-04-24 ENCOUNTER — Other Ambulatory Visit: Payer: Self-pay

## 2022-04-24 DIAGNOSIS — E1141 Type 2 diabetes mellitus with diabetic mononeuropathy: Secondary | ICD-10-CM

## 2022-04-24 NOTE — Telephone Encounter (Signed)
Schedule appointment. Last appointment 06/17/2021.

## 2022-04-27 ENCOUNTER — Other Ambulatory Visit: Payer: Self-pay

## 2022-06-05 ENCOUNTER — Ambulatory Visit: Payer: Self-pay | Admitting: *Deleted

## 2022-06-05 NOTE — Telephone Encounter (Signed)
  Chief Complaint: chest tightness after swallowing solid food Symptoms: after eating c/o "going down the wrong pipe" feels chest tightness. Has made self vomit do to feeling like food not going down. Feels like getting stuck in chest area. Can drink liquids without chest tightness. Frequency: 2 weeks  Pertinent Negatives: Patient denies chest pain no difficulty breathing no sweating no dizziness Disposition: [] ED /[] Urgent Care (no appt availability in office) / [x] Appointment(In office/virtual)/ []  [place] [facility]/ [] Home Care/ [] Refused Recommended Disposition /[] [place] [facility]/ []  Follow-up with PCP Additional Notes:   Recommended to be seen within 24 hours. Appt scheduled for 06/15/22. On waitlist. Recommended is sx return go to [facility] or ED . Please advise if patient can be seen today .    Reason for Disposition  [1] Swallowing difficulty AND [2] cause unknown  (Exception: Difficulty swallowing is a chronic symptom.)  Answer Assessment - Initial Assessment Questions 1. DESCRIPTION: "Tell me more about this problem." "Are you  having trouble swallowing liquids, solids, or both?" "Any trouble with swallowing saliva (spit)?"    Solid food 2. SEVERITY: "How bad is the swallowing difficulty?"  (e.g., Scale 1-10; or mild, moderate, severe)   - MILD (0-3): Occasional swallowing difficulty; has trouble swallowing certain types of foods or liquids.   - MODERATE (4-7): Frequent swallowing difficulty; only able to swallow small amounts of foods and fluids.   - SEVERE (8-10): Unable to swallow any foods, fluids, or saliva; sensation of "lump in throat" or "something stuck in throat", and frequent drooling or spitting may be present.     "feels like something stuck in chest" when eating solids can swallow liquids and pills without issue 3. ONSET: "When did the swallowing problems begin?"       2 weeks ago  4. CAUSE: "What do you think is causing the problem?"  (e.g., dry mouth, food or  pill stuck in throat, mouth pain, sore throat, progression of disease process such as dementia or Parkinson's disease).      Not sure  5. CHRONIC or RECURRENT: "Is this a new problem for you?"  If No, ask: "How long have you had this problem?" (e.g., days, weeks, months)       New  6. OTHER SYMPTOMS: "Do you have any other symptoms?" (e.g., chest pain, difficulty breathing, mouth sores, sore throat, swollen tongue, chest pain)     Chest tightness , "feels like going down the wrong pipe"   7. PREGNANCY: "Is there any chance you are pregnant?" "When was your last menstrual period?"     na  Protocols used: Swallowing Difficulty-A-AH

## 2022-06-11 NOTE — Progress Notes (Signed)
Patient ID: [name] [name], male    DOB: 01/14/1970  MRN: [id number]  CC: Follow-Up  Subjective: [name] [name] is a 52 y.o. male who presents for follow-up.   His concerns today include:  06/05/2022 per triage RN call note: Chief Complaint: chest tightness after swallowing solid food Symptoms: after eating c/o "going down the wrong pipe" feels chest tightness. Has made self vomit do to feeling like food not going down. Feels like getting stuck in chest area. Can drink liquids without chest tightness. Frequency: 2 weeks  Pertinent Negatives: Patient denies chest pain no difficulty breathing no sweating no dizziness Disposition: [] ED /[] Urgent Care (no appt availability in office) / [x] Appointment(In office/virtual)/ []  [place] [facility]/ [] Home Care/ [] Refused Recommended Disposition /[] [place] [facility]/ []  Follow-up with PCP Additional Notes:    Recommended to be seen within 24 hours. Appt scheduled for 06/15/22. On waitlist. Recommended is sx return go to [facility] or ED . Please advise if patient can be seen today .      Reason for Disposition  [1] Swallowing difficulty AND [2] cause unknown  (Exception: Difficulty swallowing is a chronic symptom.)  Answer Assessment - Initial Assessment Questions 1. DESCRIPTION: "Tell me more about this problem." "Are you  having trouble swallowing liquids, solids, or both?" "Any trouble with swallowing saliva (spit)?"    Solid food 2. SEVERITY: "How bad is the swallowing difficulty?"  (e.g., Scale 1-10; or mild, moderate, severe)   - MILD (0-3): Occasional swallowing difficulty; has trouble swallowing certain types of foods or liquids.   - MODERATE (4-7): Frequent swallowing difficulty; only able to swallow small amounts of foods and fluids.   - SEVERE (8-10): Unable to swallow any foods, fluids, or saliva; sensation of "lump in throat" or "something stuck in throat", and frequent drooling or spitting may be present.     "feels like something  stuck in chest" when eating solids can swallow liquids and pills without issue 3. ONSET: "When did the swallowing problems begin?"       2 weeks ago  4. CAUSE: "What do you think is causing the problem?"  (e.g., dry mouth, food or pill stuck in throat, mouth pain, sore throat, progression of disease process such as dementia or Parkinson's disease).      Not sure  5. CHRONIC or RECURRENT: "Is this a new problem for you?"  If No, ask: "How long have you had this problem?" (e.g., days, weeks, months)       New  6. OTHER SYMPTOMS: "Do you have any other symptoms?" (e.g., chest pain, difficulty breathing, mouth sores, sore throat, swollen tongue, chest pain)     Chest tightness , "feels like going down the wrong pipe"   7. PREGNANCY: "Is there any chance you are pregnant?" "When was your last menstrual period?"     na  Protocols used: Swallowing Difficulty-A-AH  Today's visit 06/15/2022: - Patient's report concerning swallowing difficulty consistent with triage RN call note. Reports most foods and beverages cause swallowing difficulty. Reports sometimes when he eats/drinks doesn't have symptoms. - Reports he has not followed up with Endocrinology since he established due to commute. He is not taking Metformin due to caused side effects/upset stomach. Reports he does not consistently take insulin due to his busy schedule. Currently taking 20 units insulin when he remembers to do so. He does not check blood sugars at home. He is monitoring what he eats.  - Reports anxiety depression persisting. Primarily related to his grandchildren are  in foster care and he is trying to get custody. Reports challenges with him getting custody of his grandchildren due to he was incarcerated for 19 years. Reports his grandchildren's mother is schizophrenic/bipolar and currently incarcerated due to assault with deadly weapon and she also recently had a baby. Reports he does not know where the baby is. Reports in the past  Sertraline did not help. He would like to trial a new medication. He was established with Behavioral Health in the past and requests referral back to the same. Patient denies thoughts of self-harm, suicidal ideations, homicidal ideations. - No further issues/concerns for discussion today.    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 100 each 0   lamoTRIgine (LAMICTAL) 100 MG tablet Take 1 tablet (100 mg total) by mouth daily. Take one tablet daily 30 tablet 1   metFORMIN (GLUCOPHAGE) 500 MG tablet Take 1 tablet (500 mg total) by mouth 2 (two) times daily with a meal. 180 tablet 3   atorvastatin (LIPITOR) 20 MG tablet Take 1 tablet (20 mg total) by mouth once daily. 120 tablet 0   gabapentin (NEURONTIN) 100 MG capsule Take 1 capsule (100 mg total) by mouth 3 (three) times daily. 90 capsule 0   ibuprofen (ADVIL) 200 MG tablet Take 400 mg by mouth every 6 (six) hours as needed for fever or moderate pain.  (Patient not taking: Reported on 10/30/2020)     No current facility-administered medications on file prior to visit.    No Known Allergies  Social History   Socioeconomic History   Marital status: Single    Spouse name: Not on file   Number of children: Not on file   Years of education: Not on file   Highest education level: GED or equivalent  Occupational History   Not on file  Tobacco Use   Smoking status: Never    Passive exposure: Current   Smokeless tobacco: Current  Vaping  Use   Vaping Use: Never used  Substance and Sexual Activity   Alcohol use: Never   Drug use: Never   Sexual activity: Yes  Other Topics Concern   Not on file  Social History Narrative   Not on file   Social Determinants of Health   Financial Resource Strain: High Risk (06/11/2022)   Overall Financial Resource Strain (CARDIA)    Difficulty of Paying Living Expenses: Very hard  Food Insecurity: Food Insecurity Present (06/11/2022)   Hunger Vital Sign    Worried About Running Out of Food in the Last Year: Often true    Ran Out of Food in the Last Year: Often true  Transportation Needs: Unmet Transportation Needs (06/11/2022)   PRAPARE - Administrator, Civil Service (Medical): Yes    Lack of Transportation (Non-Medical): Yes  Physical Activity: Not on file  Stress: Not on file  Social Connections: Unknown (06/11/2022)   Social Connection and Isolation Panel [NHANES]    Frequency of Communication with Friends and Family: More than three times  a week    Frequency of Social Gatherings with Friends and Family: Twice a week    Attends Religious Services: Patient declined    Database administrator or Organizations: No    Attends Engineer, structural: Not on file    Marital Status: Living with partner  Intimate Partner Violence: Not on file    Family History  Problem Relation Age of Onset   Diabetes Paternal Grandmother    Diabetes Paternal Grandfather    Stroke Paternal Grandfather     No past surgical history on file.  ROS: Review of Systems Negative except as stated above  PHYSICAL EXAM: BP 104/70   Pulse 91   Temp 97.7 F (36.5 C) (Oral)   Resp 16   Ht 5\' 8"  (1.727 m)   Wt 159 lb 6.4 oz (72.3 kg)   SpO2 98%   BMI 24.24 kg/m   Physical Exam HENT:     Head: Normocephalic and atraumatic.     Nose: Nose normal.     Mouth/Throat:     Mouth: Mucous membranes are moist.     Pharynx: Oropharynx is clear.  Eyes:     Extraocular Movements:  Extraocular movements intact.     Conjunctiva/sclera: Conjunctivae normal.     Pupils: Pupils are equal, round, and reactive to light.  Cardiovascular:     Rate and Rhythm: Normal rate and regular rhythm.     Pulses: Normal pulses.     Heart sounds: Normal heart sounds.  Pulmonary:     Effort: Pulmonary effort is normal.     Breath sounds: Normal breath sounds.  Abdominal:     General: Bowel sounds are normal.     Palpations: Abdomen is soft.  Musculoskeletal:     Cervical back: Normal range of motion and neck supple.  Neurological:     General: No focal deficit present.     Mental Status: He is alert and oriented to person, place, and time.  Psychiatric:        Mood and Affect: Mood normal.        Behavior: Behavior normal.     ASSESSMENT AND PLAN: 1. Dysphagia, unspecified type - Omeprazole as prescribed. Counseled on medication adherence/adverse effects.  - Referral to Gastroenterology for further evaluation/management. During the interim follow-up with primary provider as scheduled. - Ambulatory referral to Gastroenterology - omeprazole (PRILOSEC) 20 MG capsule; Take 1 capsule (20 mg total) by mouth daily.  Dispense: 90 capsule; Refill: 0  2. Uncontrolled type 2 diabetes mellitus with hyperglycemia (HCC) 3. Nonadherence to medication - Hemoglobin A1c not at goal at > 15%, goal 7%. This is increased compared to previous 14.8%. - Increase Insulin Glargine from 20 units daily to 25 units daily.  - Empagliflozin as prescribed. Counseled on medication adherence/adverse effects.  - Routine screening.  - To achieve an A1C goal of less than or equal to 7.0 percent, a fasting blood sugar of 80 to 130 mg/dL and a postprandial glucose (90 to 120 minutes after a meal) less than 180 mg/dL. In the event of sugars less than 60 mg/dl or greater than 161 mg/dl please notify the clinic ASAP. It is recommended that you undergo annual eye exams and annual foot exams. - Discussed the importance  of healthy eating habits, low-carbohydrate diet, low-sugar diet, regular aerobic exercise (at least 150 minutes a week as tolerated) and medication compliance to achieve or maintain control of diabetes. - Follow-up with clinical pharmacist in 4 weeks or sooner if  needed for diabetes checkup. Write your home blood sugar results down each day and bring those results to your appointment along with your home glucose monitor. Medications may be revised at that time if needed. - Follow-up with primary provider as scheduled. - Microalbumin / creatinine urine ratio - Basic Metabolic Panel - POCT glycosylated hemoglobin (Hb A1C); Future - Ambulatory referral to Endocrinology - Accu-Chek Softclix Lancets lancets; 1 each by Other route 4 (four) times daily -  before meals and at bedtime. Use as directed.  Dispense: 100 each; Refill: 5 - Blood Glucose Monitoring Suppl (ACCU-CHEK GUIDE) w/Device KIT; 1 each by Other route 4 (four) times daily -  before meals and at bedtime. Check blood sugar once daily in the morning before taking Lantus  Dispense: 1 kit; Refill: 0 - glucose blood (ACCU-CHEK GUIDE) test strip; 1 each by Other route 4 (four) times daily -  before meals and at bedtime. Use as instructed  Dispense: 100 each; Refill: 12 - empagliflozin (JARDIANCE) 10 MG TABS tablet; Take 1 tablet (10 mg total) by mouth daily before breakfast.  Dispense: 30 tablet; Refill: 1 - insulin glargine (LANTUS SOLOSTAR) 100 UNIT/ML Solostar Pen; Inject 25 Units into the skin at bedtime.  Dispense: 30 mL; Refill: 3  3. Anxiety and depression - Patient denies thoughts of self-harm, suicidal ideations, homicidal ideations. - Sertraline discontinued due to ineffective. - Fluoxetine and Hydroxyzine as prescribed. Counseled on medication adherence/adverse effects.  - Referral to Psychiatry for further evaluation/management. During the interim follow-up with primary provider in 4 weeks or sooner if needed until established with  referral. - FLUoxetine (PROZAC) 20 MG capsule; Take 1 capsule (20 mg total) by mouth daily.  Dispense: 30 capsule; Refill: 1 - hydrOXYzine (VISTARIL) 25 MG capsule; Take 1 capsule (25 mg total) by mouth every 8 (eight) hours as needed.  Dispense: 30 capsule; Refill: 1 - Ambulatory referral to Psychiatry   Patient was given the opportunity to ask questions.  Patient verbalized understanding of the plan and was able to repeat key elements of the plan. Patient was given clear instructions to go to Emergency Department or return to medical center if symptoms don't improve, worsen, or new problems develop.The patient verbalized understanding.   Orders Placed This Encounter  Procedures   Microalbumin / creatinine urine ratio   Basic Metabolic Panel   Ambulatory referral to Gastroenterology   Ambulatory referral to Endocrinology   Ambulatory referral to Psychiatry   POCT glycosylated hemoglobin (Hb A1C)     Requested Prescriptions   Signed Prescriptions Disp Refills   omeprazole (PRILOSEC) 20 MG capsule 90 capsule 0    Sig: Take 1 capsule (20 mg total) by mouth daily.   FLUoxetine (PROZAC) 20 MG capsule 30 capsule 1    Sig: Take 1 capsule (20 mg total) by mouth daily.   hydrOXYzine (VISTARIL) 25 MG capsule 30 capsule 1    Sig: Take 1 capsule (25 mg total) by mouth every 8 (eight) hours as needed.   Accu-Chek Softclix Lancets lancets 100 each 5    Sig: 1 each by Other route 4 (four) times daily -  before meals and at bedtime. Use as directed.   Blood Glucose Monitoring Suppl (ACCU-CHEK GUIDE) w/Device KIT 1 kit 0    Sig: 1 each by Other route 4 (four) times daily -  before meals and at bedtime. Check blood sugar once daily in the morning before taking Lantus   glucose blood (ACCU-CHEK GUIDE) test strip 100 each 12  Sig: 1 each by Other route 4 (four) times daily -  before meals and at bedtime. Use as instructed   empagliflozin (JARDIANCE) 10 MG TABS tablet 30 tablet 1    Sig: Take 1  tablet (10 mg total) by mouth daily before breakfast.   insulin glargine (LANTUS SOLOSTAR) 100 UNIT/ML Solostar Pen 30 mL 3    Sig: Inject 25 Units into the skin at bedtime.    Return in about 4 weeks (around 07/13/2022) for Follow-Up or next available with [name], RPH-CPP.  Rema Fendt, NP

## 2022-06-15 ENCOUNTER — Other Ambulatory Visit: Payer: Self-pay

## 2022-06-15 ENCOUNTER — Ambulatory Visit (INDEPENDENT_AMBULATORY_CARE_PROVIDER_SITE_OTHER): Payer: Medicaid Other | Admitting: Family

## 2022-06-15 VITALS — BP 104/70 | HR 91 | Temp 97.7°F | Resp 16 | Ht 68.0 in | Wt 159.4 lb

## 2022-06-15 DIAGNOSIS — F419 Anxiety disorder, unspecified: Secondary | ICD-10-CM | POA: Diagnosis not present

## 2022-06-15 DIAGNOSIS — Z91148 Patient's other noncompliance with medication regimen for other reason: Secondary | ICD-10-CM

## 2022-06-15 DIAGNOSIS — Z794 Long term (current) use of insulin: Secondary | ICD-10-CM | POA: Diagnosis not present

## 2022-06-15 DIAGNOSIS — E1165 Type 2 diabetes mellitus with hyperglycemia: Secondary | ICD-10-CM | POA: Diagnosis not present

## 2022-06-15 DIAGNOSIS — R1310 Dysphagia, unspecified: Secondary | ICD-10-CM | POA: Diagnosis not present

## 2022-06-15 DIAGNOSIS — Z7984 Long term (current) use of oral hypoglycemic drugs: Secondary | ICD-10-CM

## 2022-06-15 DIAGNOSIS — F32A Depression, unspecified: Secondary | ICD-10-CM

## 2022-06-15 LAB — POCT GLYCOSYLATED HEMOGLOBIN (HGB A1C): Hemoglobin A1C: 15 % — AB (ref 4.0–5.6)

## 2022-06-15 MED ORDER — LANTUS SOLOSTAR 100 UNIT/ML ~~LOC~~ SOPN
30.0000 [IU] | PEN_INJECTOR | Freq: Every day | SUBCUTANEOUS | 3 refills | Status: DC
Start: 2022-06-15 — End: 2022-06-15
  Filled 2022-06-15: qty 30, 100d supply, fill #0

## 2022-06-15 MED ORDER — FLUOXETINE HCL 20 MG PO CAPS
20.0000 mg | ORAL_CAPSULE | Freq: Every day | ORAL | 1 refills | Status: DC
Start: 2022-06-15 — End: 2022-09-09
  Filled 2022-06-15: qty 30, 30d supply, fill #0
  Filled 2022-07-14: qty 30, 30d supply, fill #1

## 2022-06-15 MED ORDER — OMEPRAZOLE 20 MG PO CPDR
20.0000 mg | DELAYED_RELEASE_CAPSULE | Freq: Every day | ORAL | 0 refills | Status: DC
Start: 2022-06-15 — End: 2022-07-14
  Filled 2022-06-15: qty 90, 90d supply, fill #0

## 2022-06-15 MED ORDER — ACCU-CHEK GUIDE VI STRP
1.0000 | ORAL_STRIP | Freq: Three times a day (TID) | 12 refills | Status: AC
Start: 2022-06-15 — End: ?
  Filled 2022-06-15: qty 100, 25d supply, fill #0
  Filled 2022-07-14 – 2022-09-22 (×2): qty 100, 25d supply, fill #1

## 2022-06-15 MED ORDER — ACCU-CHEK SOFTCLIX LANCETS MISC
1.0000 | Freq: Three times a day (TID) | 5 refills | Status: AC
Start: 2022-06-15 — End: ?
  Filled 2022-06-15: qty 100, 25d supply, fill #0
  Filled 2022-07-14 – 2022-09-22 (×2): qty 100, 25d supply, fill #1
  Filled 2023-02-19 – 2023-04-14 (×2): qty 100, 25d supply, fill #2

## 2022-06-15 MED ORDER — EMPAGLIFLOZIN 10 MG PO TABS
10.0000 mg | ORAL_TABLET | Freq: Every day | ORAL | 1 refills | Status: AC
Start: 2022-06-15 — End: ?
  Filled 2022-06-15: qty 30, 30d supply, fill #0
  Filled 2022-07-14: qty 30, 30d supply, fill #1

## 2022-06-15 MED ORDER — ACCU-CHEK GUIDE W/DEVICE KIT
1.0000 | PACK | Freq: Three times a day (TID) | 0 refills | Status: AC
Start: 2022-06-15 — End: ?
  Filled 2022-06-15: qty 1, 30d supply, fill #0

## 2022-06-15 MED ORDER — HYDROXYZINE PAMOATE 25 MG PO CAPS
25.0000 mg | ORAL_CAPSULE | Freq: Three times a day (TID) | ORAL | 1 refills | Status: DC | PRN
Start: 2022-06-15 — End: 2022-12-09
  Filled 2022-06-15: qty 30, 10d supply, fill #0
  Filled 2022-07-14: qty 30, 10d supply, fill #1

## 2022-06-15 MED ORDER — LANTUS SOLOSTAR 100 UNIT/ML ~~LOC~~ SOPN
25.0000 [IU] | PEN_INJECTOR | Freq: Every day | SUBCUTANEOUS | 3 refills | Status: DC
Start: 2022-06-15 — End: 2023-05-19
  Filled 2022-06-15: qty 21, 84d supply, fill #0
  Filled 2022-06-15: qty 30, 120d supply, fill #0
  Filled 2023-01-04: qty 6, 24d supply, fill #1
  Filled 2023-02-19: qty 6, 24d supply, fill #2
  Filled 2023-04-14 – 2023-05-05 (×2): qty 6, 24d supply, fill #3

## 2022-06-16 ENCOUNTER — Other Ambulatory Visit (HOSPITAL_BASED_OUTPATIENT_CLINIC_OR_DEPARTMENT_OTHER): Payer: Self-pay

## 2022-06-16 ENCOUNTER — Telehealth: Payer: Self-pay

## 2022-06-16 ENCOUNTER — Other Ambulatory Visit: Payer: Self-pay

## 2022-06-16 ENCOUNTER — Emergency Department (HOSPITAL_BASED_OUTPATIENT_CLINIC_OR_DEPARTMENT_OTHER)
Admission: EM | Admit: 2022-06-16 | Discharge: 2022-06-16 | Disposition: A | Discharge status: Home / Self Care | Payer: Medicaid Other | Attending: Emergency Medicine | Admitting: Emergency Medicine

## 2022-06-16 ENCOUNTER — Encounter (HOSPITAL_BASED_OUTPATIENT_CLINIC_OR_DEPARTMENT_OTHER): Payer: Self-pay | Admitting: Emergency Medicine

## 2022-06-16 ENCOUNTER — Emergency Department (HOSPITAL_BASED_OUTPATIENT_CLINIC_OR_DEPARTMENT_OTHER): Payer: Medicaid Other | Admitting: Radiology

## 2022-06-16 DIAGNOSIS — Z794 Long term (current) use of insulin: Secondary | ICD-10-CM | POA: Insufficient documentation

## 2022-06-16 DIAGNOSIS — R739 Hyperglycemia, unspecified: Secondary | ICD-10-CM

## 2022-06-16 DIAGNOSIS — E1165 Type 2 diabetes mellitus with hyperglycemia: Secondary | ICD-10-CM | POA: Insufficient documentation

## 2022-06-16 DIAGNOSIS — Z7984 Long term (current) use of oral hypoglycemic drugs: Secondary | ICD-10-CM | POA: Insufficient documentation

## 2022-06-16 LAB — BASIC METABOLIC PANEL
Anion gap: 10 (ref 5–15)
BUN/Creatinine Ratio: 7 — ABNORMAL LOW (ref 9–20)
BUN: 8 mg/dL (ref 6–24)
BUN: 10 mg/dL (ref 6–20)
CO2: 21 mmol/L (ref 20–29)
CO2: 24 mmol/L (ref 22–32)
Calcium: 9.6 mg/dL (ref 8.7–10.2)
Calcium: 9.6 mg/dL (ref 8.9–10.3)
Chloride: 94 mmol/L — ABNORMAL LOW (ref 96–106)
Chloride: 96 mmol/L — ABNORMAL LOW (ref 98–111)
Creatinine, Ser: 1.15 mg/dL (ref 0.76–1.27)
Creatinine, Ser: 1.02 mg/dL (ref 0.61–1.24)
GFR, Estimated: >60 mL/min (ref >60)
Glucose, Bld: 667 mg/dL (ref 70–99)
Glucose: 549 mg/dL (ref 70–99)
Potassium: 4.6 mmol/L (ref 3.5–5.2)
Potassium: 3.8 mmol/L (ref 3.5–5.1)
Sodium: 133 mmol/L — ABNORMAL LOW (ref 134–144)
Sodium: 130 mmol/L — ABNORMAL LOW (ref 135–145)
eGFR: 77 mL/min/{1.73_m2} (ref >59)

## 2022-06-16 LAB — CBC
HCT: 43.7 % (ref 39.0–52.0)
Hemoglobin: 15.1 g/dL (ref 13.0–17.0)
MCH: 29.4 pg (ref 26.0–34.0)
MCHC: 34.6 g/dL (ref 30.0–36.0)
MCV: 85 fL (ref 80.0–100.0)
Platelets: 193 10*3/uL (ref 150–400)
RBC: 5.14 MIL/uL (ref 4.22–5.81)
RDW: 12.4 % (ref 11.5–15.5)
WBC: 6.2 10*3/uL (ref 4.0–10.5)
nRBC: 0 % (ref 0.0–0.2)

## 2022-06-16 LAB — URINALYSIS, ROUTINE W REFLEX MICROSCOPIC
Bacteria, UA: NONE SEEN
Bilirubin Urine: NEGATIVE
Glucose, UA: >1000 mg/dL — AB
Hgb urine dipstick: NEGATIVE
Ketones, ur: NEGATIVE mg/dL
Leukocytes,Ua: NEGATIVE
Nitrite: NEGATIVE
Protein, ur: NEGATIVE mg/dL
Specific Gravity, Urine: 1.035 — ABNORMAL HIGH (ref 1.005–1.030)
pH: 6 (ref 5.0–8.0)

## 2022-06-16 LAB — MICROALBUMIN / CREATININE URINE RATIO
Creatinine, Urine: 33.3 mg/dL
Microalb/Creat Ratio: 24 mg/g creat (ref 0–29)
Microalbumin, Urine: 8.1 ug/mL

## 2022-06-16 LAB — CBG MONITORING, ED
Glucose-Capillary: >600 mg/dL (ref 70–99)
Glucose-Capillary: 393 mg/dL — ABNORMAL HIGH (ref 70–99)

## 2022-06-16 LAB — BETA-HYDROXYBUTYRIC ACID: Beta-Hydroxybutyric Acid: 0.18 mmol/L (ref 0.05–0.27)

## 2022-06-16 MED ORDER — SODIUM CHLORIDE 0.9 % IV BOLUS: 1000.0000 mL | Freq: Once | INTRAVENOUS | Status: DC

## 2022-06-16 MED ORDER — INSULIN REGULAR HUMAN 100 UNIT/ML IJ SOLN: 10.0000 [IU] | Freq: Once | INTRAMUSCULAR | Status: DC

## 2022-06-16 MED ORDER — INSULIN ASPART 100 UNIT/ML IJ SOLN
10.0000 [IU] | Freq: Once | INTRAMUSCULAR | Status: AC
  Administered 2022-06-16: 10 [IU] via SUBCUTANEOUS

## 2022-06-16 MED ORDER — LACTATED RINGERS IV BOLUS
1000.0000 mL | Freq: Once | INTRAVENOUS | Status: AC
  Administered 2022-06-16: 1000 mL via INTRAVENOUS

## 2022-06-16 NOTE — ED Notes (Signed)
Pt discharged to home using teachback Method. Discharge instructions have been discussed with patient and/or family members. Pt verbally acknowledges understanding d/c instructions, has been given opportunity for questions to be answered, and endorses comprehension to checkout at registration before leaving.  

## 2022-06-16 NOTE — ED Triage Notes (Signed)
Pt arrives pov, steady gait with c/o elev. Blood glucose, referred by PCP. Endorses urinary freq x 2 weeks

## 2022-06-16 NOTE — Telephone Encounter (Signed)
Spoke to Clydie Braun from [employer] regarding critial results. Call transferred from [facility] by agent.  Patient was informed of the results this morning at 0854 by Jessie Foot, RMA.

## 2022-06-16 NOTE — Telephone Encounter (Signed)
[facility] was calling to report critical result, person with [facility] disconnected call before NT could speak with him. Reviewed chart and there are criticals from yesterday OV as well as today but pt is at ED so will send to provider as FYI.

## 2022-06-16 NOTE — Discharge Instructions (Addendum)
It was a pleasure taking care of you today!  Your glucose was elevated today in the emergency department.  It was treated with insulin.  It is important that you take your insulin as prescribed.  Also take the Jardiance as prescribed.  Ensure that you maintain follow-up with your primary care provider, endocrinologist, GI specialist as directed.  Attached is information for nutrition and recommended foods to eat/drink with diabetes.  Ensure that you are checking your sugars as informed by your primary care provider.  Return to the emergency department if you are experiencing increasing/worsening symptoms.

## 2022-06-16 NOTE — ED Provider Notes (Signed)
[facility] EMERGENCY DEPARTMENT AT [facility] Provider Note   CSN: [id number] Arrival date & time: 06/16/22  1191     History  Chief Complaint  Patient presents with   Hyperglycemia    [name] [name] is a 52 y.o. male with a past medical history of type 2 diabetes who presents emergency department with concerns for hyperglycemia.  Notes that he was evaluated by his primary care provider on yesterday 06/15/2022 he was told to come to the emergency department due to his elevated glucose of 549.  Patient notes that he has not been compliant with his metformin or his insulin due to life stressors.  Has associated polyuria.  Has not followed up with his endocrinologist since last year.  Denies chest pain, shortness of breath, abdominal pain, nausea, vomiting, polydipsia.  The history is provided by the patient. No language interpreter was used.       Home Medications Prior to Admission medications   Medication Sig Start Date End Date Taking? Authorizing Provider  Accu-Chek Softclix Lancets lancets Testing 4 (four) times daily -  before meals and at bedtime. Use as directed. 06/15/22   Rema Fendt, NP  atorvastatin (LIPITOR) 20 MG tablet Take 1 tablet (20 mg total) by mouth once daily. 06/17/21 10/15/21  Rema Fendt, NP  Blood Glucose Monitoring Suppl (ACCU-CHEK GUIDE) w/Device KIT 1 each by Other route 4 (four) times daily -  before meals and at bedtime. Check blood sugar once daily in the morning before taking Lantus 06/15/22   Rema Fendt, NP  empagliflozin (JARDIANCE) 10 MG TABS tablet Take 1 tablet (10 mg total) by mouth daily before breakfast. 06/15/22   Rema Fendt, NP  FLUoxetine (PROZAC) 20 MG capsule Take 1 capsule (20 mg total) by mouth daily. 06/15/22   Rema Fendt, NP  gabapentin (NEURONTIN) 100 MG capsule Take 1 capsule (100 mg total) by mouth 3 (three) times daily. 01/02/22 02/01/22  Rema Fendt, NP  glucose blood (ACCU-CHEK GUIDE) test strip Testing 4 (four)  times daily -  before meals and at bedtime. Use as instructed 06/15/22   Rema Fendt, NP  hydrOXYzine (VISTARIL) 25 MG capsule Take 1 capsule (25 mg total) by mouth every 8 (eight) hours as needed. 06/15/22   Rema Fendt, NP  insulin glargine (LANTUS SOLOSTAR) 100 UNIT/ML Solostar Pen Inject 25 Units into the skin at bedtime. 06/15/22   Rema Fendt, NP  Insulin Pen Needle (TECHLITE PEN NEEDLES) 32G X 4 MM MISC Use as directed daily. 06/19/21   Rema Fendt, NP  lamoTRIgine (LAMICTAL) 100 MG tablet Take 1 tablet (100 mg total) by mouth daily. Take one tablet daily 04/07/21   Thresa Ross, MD  metFORMIN (GLUCOPHAGE) 500 MG tablet Take 1 tablet (500 mg total) by mouth 2 (two) times daily with a meal. 08/06/21   Dani Gobble, NP  omeprazole (PRILOSEC) 20 MG capsule Take 1 capsule (20 mg total) by mouth daily. 06/15/22   Rema Fendt, NP      Allergies    Patient has no known allergies.    Review of Systems   Review of Systems  All other systems reviewed and are negative.   Physical Exam Updated Vital Signs BP 131/81   Pulse 89   Temp 98.3 F (36.8 C) (Oral)   Resp 18   Ht 5\' 8"  (1.727 m)   Wt 71.7 kg   SpO2 98%   BMI 24.02 kg/m  Physical Exam  Vitals and nursing note reviewed.  Constitutional:      General: He is not in acute distress.    Appearance: He is not diaphoretic.  HENT:     Head: Normocephalic and atraumatic.     Mouth/Throat:     Pharynx: No oropharyngeal exudate.  Eyes:     General: No scleral icterus.    Conjunctiva/sclera: Conjunctivae normal.  Cardiovascular:     Rate and Rhythm: Normal rate and regular rhythm.     Pulses: Normal pulses.     Heart sounds: Normal heart sounds.  Pulmonary:     Effort: Pulmonary effort is normal. No respiratory distress.     Breath sounds: Normal breath sounds. No wheezing.  Abdominal:     General: Bowel sounds are normal.     Palpations: Abdomen is soft. There is no mass.     Tenderness: There is no abdominal  tenderness. There is no guarding or rebound.  Musculoskeletal:        General: Normal range of motion.     Cervical back: Normal range of motion and neck supple.  Skin:    General: Skin is warm and dry.  Neurological:     Mental Status: He is alert.  Psychiatric:        Behavior: Behavior normal.     ED Results / Procedures / Treatments   Labs (all labs ordered are listed, but only abnormal results are displayed) Labs Reviewed  BASIC METABOLIC PANEL - Abnormal; Notable for the following components:      Result Value   Sodium 130 (*)    Chloride 96 (*)    Glucose, Bld 667 (*)    All other components within normal limits  URINALYSIS, ROUTINE W REFLEX MICROSCOPIC - Abnormal; Notable for the following components:   Color, Urine COLORLESS (*)    Specific Gravity, Urine 1.035 (*)    Glucose, UA >1,000 (*)    All other components within normal limits  CBG MONITORING, ED - Abnormal; Notable for the following components:   Glucose-Capillary >600 (*)    All other components within normal limits  CBG MONITORING, ED - Abnormal; Notable for the following components:   Glucose-Capillary 393 (*)    All other components within normal limits  CBC  BETA-HYDROXYBUTYRIC ACID  CBG MONITORING, ED    EKG None  Radiology DG Chest 2 View  Result Date: 06/16/2022 CLINICAL DATA:  Dysphagia. EXAM: CHEST - 2 VIEW COMPARISON:  07/17/2020. FINDINGS: Clear lungs. Normal heart size and mediastinal contours. No pleural effusion or pneumothorax. Visualized bones and upper abdomen are unremarkable. IMPRESSION: No evidence of acute cardiopulmonary disease. Electronically Signed   By: Orvan Falconer M.D.   On: 06/16/2022 11:13    Procedures Procedures    Medications Ordered in ED Medications  lactated ringers bolus 1,000 mL (0 mLs Intravenous Stopped 06/16/22 1221)  insulin aspart (novoLOG) injection 10 Units (10 Units Subcutaneous Given 06/16/22 1114)    ED Course/ Medical Decision Making/  A&P Clinical Course as of 06/16/22 1446  Tue Jun 16, 2022  1229 Glucose-Capillary(!): 393 [SB]  1319 Discussed with patient lab and imaging findings.  Discussed with patient again in depth regarding importance of maintaining diabetic friendly nutrition as well as importance of taking his medications as they are prescribed to him.  Also discussed with patient importance of following up with his care team as it is recommended.  Patient agreeable and acknowledges and verbalized understanding at this time.  Patient appears safe for discharge at  this time. [SB]    Clinical Course User Index [SB] Braylin Xu A, PA-C                             Medical Decision Making Amount and/or Complexity of Data Reviewed Labs: ordered. Decision-making details documented in ED Course. Radiology: ordered.  Risk Prescription drug management.   Pt presents with concerns for hyperglycemia.  Patient was seen with his primary care doctor yesterday and advised to come into the ED due to concerns for elevated glucose levels.  Patient afebrile, not tachycardic or hypoxic.  On exam patient without acute cardiovascular, respiratory, Donnell exam findings.  Differential diagnosis includes hyperglycemia, HHS, DKA.   Co morbidities that complicate the patient evaluation: Type 2 uncontrolled DM  Additional history obtained:  External records from outside source obtained and reviewed including: Patient was evaluated by his primary care provider on 06/15/2022.  At that time had his metformin switched to Jardiance.  Was also sent a prescription for Solostar.  Also provided with referrals to endocrinology, gastroenterology, diabetic nutritionist.  Labs:  I ordered, and personally interpreted labs.  The pertinent results include:   BMP with glucose of 667 and without anion gap otherwise unremarkable CBC unremarkable Urinalysis unremarkable Beta hydroxybutyrate acid unremarkable Initial CBG greater than 600, repeat CBG at  393  Imaging: I ordered imaging studies including chest x-ray I independently visualized and interpreted imaging which showed: No acute findings I agree with the radiologist interpretation  Medications:  I ordered medication including IV fluids, NovoLog for hyperglycemia I have reviewed the patients home medicines and have made adjustments as needed    Disposition: Presenting suspicious for hypoglycemia.  Doubt concerns at this time for DKA.Marland Kitchen After consideration of the diagnostic results and the patients response to treatment, I feel that the patient would benefit from Discharge home.  Discussed with patient importance of following up with care team regarding today's ED visit.  Also discussed with patient importance of taking his medications as they are prescribed to him.  Supportive care measures and strict return precautions discussed with patient at bedside. Pt acknowledges and verbalizes understanding. Pt appears safe for discharge. Follow up as indicated in discharge paperwork.    This chart was dictated using voice recognition software, Dragon. Despite the best efforts of this provider to proofread and correct errors, errors may still occur which can change documentation meaning.  Final Clinical Impression(s) / ED Diagnoses Final diagnoses:  Hyperglycemia    Rx / DC Orders ED Discharge Orders     None         Lauralye Kinn A, PA-C 06/16/22 1447    Rexford Maus, DO 06/16/22 1553

## 2022-06-19 ENCOUNTER — Other Ambulatory Visit: Payer: Self-pay

## 2022-07-13 ENCOUNTER — Ambulatory Visit: Payer: Medicaid Other | Admitting: Pharmacist

## 2022-07-14 ENCOUNTER — Encounter: Payer: Self-pay | Admitting: Pharmacist

## 2022-07-14 ENCOUNTER — Ambulatory Visit: Payer: Medicaid Other | Attending: Family | Admitting: Pharmacist

## 2022-07-14 ENCOUNTER — Other Ambulatory Visit: Payer: Self-pay

## 2022-07-14 DIAGNOSIS — E119 Type 2 diabetes mellitus without complications: Secondary | ICD-10-CM

## 2022-07-14 DIAGNOSIS — Z794 Long term (current) use of insulin: Secondary | ICD-10-CM | POA: Diagnosis not present

## 2022-07-14 MED ORDER — FREESTYLE LIBRE 3 SENSOR MISC
4 refills | Status: DC
  Filled 2022-07-14: qty 2, 28d supply, fill #0
  Filled 2022-09-22: qty 2, 28d supply, fill #1
  Filled 2023-01-04: qty 2, 28d supply, fill #2
  Filled 2023-02-19 – 2023-03-05 (×2): qty 2, 28d supply, fill #3
  Filled 2023-04-14 – 2023-05-05 (×4): qty 2, 28d supply, fill #4

## 2022-07-14 NOTE — Progress Notes (Signed)
S:     No chief complaint on file.  52 y.o. male who presents for diabetes evaluation, education, and management. Patient arrives in good spirits and presents without any assistance.   Patient was referred and last seen by Primary Care Provider, Ricky Stabs, on 06/15/2022.  PMH is significant for T2DM w/ long-term use of insulin.   Patient reports Diabetes was diagnosed as T2DM ~3-4 years ago. He reports that he was feeling paraesthesia, fatigue, polyuria, and blurred vision for some time (says since 2016) before he sought medical attention. He tells me he was offered insulin initially but declined and tried to manage with just PO medications. His diet and medication adherence is influenced by his mood. He tells me he started insulin as an outpatient only about 1-2 years ago. He cannot tolerate metformin d/t diarrhea. Of note, no history of clinical ASCVD, CHF, CKD. No thyroid disease or pancreatitis history.   Family/Social History:  Fhx: DM in his father's side  Tobacco: never smoker  Alcohol: none reported  Current diabetes medications include: metformin 500 mg BID (not taking), Jardiance 10 mg daily, Lantus 25u daily (takes 2p) Patient reports adherence to taking all medications as prescribed except for metformin.   Insurance coverage: [place] [employer]   Patient denies hypoglycemic events.  Reported home fasting blood sugars: not checking   Reported 2 hour post-meal/random blood sugars: not checking  Patient denies nocturia (nighttime urination).  Patient reports neuropathy (nerve pain). Patient denies visual changes. Patient reports self foot exams.   Patient reported dietary habits: Eats 3 meals/day -Admits that he has an affinity for sweets and sugars -He has now changed to sugar-free candy and uses Splenda -Drinks Zero-sugar products  Patient-reported exercise habits:  -No formal exercise regimen yet  -Plans to start exercise this week    O:   ROS  Physical  Exam  7 day average blood glucose: no GM present at today's visit.  No CGM in place.   Lab Results  Component Value Date   HGBA1C 15.0 (A) 06/15/2022   There were no vitals filed for this visit.  Lipid Panel     Component Value Date/Time   CHOL 200 (H) 06/17/2021 1440   TRIG 89 06/17/2021 1440   HDL 55 06/17/2021 1440   CHOLHDL 3.6 06/17/2021 1440   CHOLHDL 6.3 07/18/2020 0224   VLDL 28 07/18/2020 0224   LDLCALC 129 (H) 06/17/2021 1440   Clinical Atherosclerotic Cardiovascular Disease (ASCVD): No  The 10-year ASCVD risk score (Arnett DK, et al., 2019) is: 8.6%   Values used to calculate the score:     Age: 37 years     Sex: Male     Is Non-Hispanic African American: Yes     Diabetic: Yes     Tobacco smoker: No     Systolic Blood Pressure: 114 mmHg     Is BP treated: No     HDL Cholesterol: 55 mg/dL     Total Cholesterol: 200 mg/dL   Patient is participating in a Managed Medicaid Plan: no   A/P: Diabetes longstanding currently uncontrolled. Patient is able to verbalize appropriate hypoglycemia management plan. Medication adherence appears appropriate but he does not have any home glucose data. Will try to get him approval for Mona. He already has the [employer] app downloaded on his phone. -Continued Lantus 25u daily for now.  -Continue Jardiance for now.  Josephine Igo 3 supplies sent.  -Pt cannot tolerate metformin. -Patient educated on purpose, proper use, and  potential adverse effects of insulin, Jardiance.  -Extensively discussed pathophysiology of diabetes, recommended lifestyle interventions, dietary effects on blood sugar control.  -Counseled on s/sx of and management of hypoglycemia.  -Next A1c anticipated 09/2022.   Written patient instructions provided. Patient verbalized understanding of treatment plan.  Total time in face to face counseling 30 minutes.    Follow-up:  Pharmacist 3-4 weeks.  [name], PharmD, Patsy [place], CPP Clinical Pharmacist [facility] [phone]

## 2022-07-15 ENCOUNTER — Other Ambulatory Visit: Payer: Self-pay

## 2022-07-17 ENCOUNTER — Telehealth: Payer: Self-pay

## 2022-07-17 ENCOUNTER — Other Ambulatory Visit: Payer: Self-pay

## 2022-07-17 NOTE — Telephone Encounter (Signed)
A PRIOR AUTHORIZATION REQUEST FOR JARDIANCE WAS SUBMITTED TO INSURANCE TODAY VIA [employer] Key: [id number]

## 2022-07-20 ENCOUNTER — Other Ambulatory Visit: Payer: Self-pay

## 2022-07-23 ENCOUNTER — Other Ambulatory Visit: Payer: Self-pay

## 2022-08-26 ENCOUNTER — Ambulatory Visit (HOSPITAL_COMMUNITY): Payer: Self-pay | Admitting: Psychiatry

## 2022-09-09 ENCOUNTER — Encounter (HOSPITAL_COMMUNITY): Payer: Self-pay | Admitting: Psychiatry

## 2022-09-09 ENCOUNTER — Ambulatory Visit (INDEPENDENT_AMBULATORY_CARE_PROVIDER_SITE_OTHER): Payer: Medicaid Other | Admitting: Psychiatry

## 2022-09-09 ENCOUNTER — Other Ambulatory Visit: Payer: Self-pay

## 2022-09-09 VITALS — BP 121/79 | HR 81 | Temp 98.2°F | Ht 69.0 in | Wt 166.0 lb

## 2022-09-09 DIAGNOSIS — F419 Anxiety disorder, unspecified: Secondary | ICD-10-CM

## 2022-09-09 DIAGNOSIS — F332 Major depressive disorder, recurrent severe without psychotic features: Secondary | ICD-10-CM | POA: Diagnosis not present

## 2022-09-09 DIAGNOSIS — F1510 Other stimulant abuse, uncomplicated: Secondary | ICD-10-CM

## 2022-09-09 DIAGNOSIS — F411 Generalized anxiety disorder: Secondary | ICD-10-CM | POA: Diagnosis not present

## 2022-09-09 DIAGNOSIS — F32A Depression, unspecified: Secondary | ICD-10-CM

## 2022-09-09 MED ORDER — FLUOXETINE HCL 20 MG PO CAPS
40.0000 mg | ORAL_CAPSULE | Freq: Every day | ORAL | 1 refills | Status: DC
Start: 2022-09-09 — End: 2022-11-04
  Filled 2022-09-09 – 2022-09-22 (×2): qty 60, 30d supply, fill #0

## 2022-09-09 MED ORDER — FLUOXETINE HCL 20 MG PO CAPS
40.0000 mg | ORAL_CAPSULE | Freq: Every day | ORAL | 1 refills | Status: DC
Start: 2022-09-09 — End: 2022-09-09
  Filled 2022-09-09: qty 60, 30d supply, fill #0
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
## 2022-09-09 NOTE — Patient Instructions (Signed)
Increase prozac to 40 mg, one month supply and one refill.  F/u in 4-6 weeks.

## 2022-09-16 ENCOUNTER — Other Ambulatory Visit: Payer: Self-pay

## 2022-09-22 ENCOUNTER — Ambulatory Visit: Payer: Self-pay | Admitting: *Deleted

## 2022-09-22 ENCOUNTER — Other Ambulatory Visit: Payer: Self-pay | Admitting: Family

## 2022-09-22 ENCOUNTER — Other Ambulatory Visit: Payer: Self-pay

## 2022-09-22 DIAGNOSIS — E1165 Type 2 diabetes mellitus with hyperglycemia: Secondary | ICD-10-CM

## 2022-09-22 NOTE — Telephone Encounter (Signed)
Reason for Disposition  [1] Numbness or tingling in one or both feet AND [2] is a chronic symptom (recurrent or ongoing AND present > 4 weeks)  Answer Assessment - Initial Assessment Questions 1. SYMPTOM: "What is the main symptom you are concerned about?" (e.g., weakness, numbness)     I'm having numbness and tingling in my hands and feet for at least 6 months.   I think it's getting worse 2. ONSET: "When did this start?" (minutes, hours, days; while sleeping)     At least 6 month ago 3. LAST NORMAL: "When was the last time you (the patient) were normal (no symptoms)?"     At least 6 months ago 4. PATTERN "Does this come and go, or has it been constant since it started?"  "Is it present now?"     Not asked 5. CARDIAC SYMPTOMS: "Have you had any of the following symptoms: chest pain, difficulty breathing, palpitations?"     No heart problems 6. NEUROLOGIC SYMPTOMS: "Have you had any of the following symptoms: headache, dizziness, vision loss, double vision, changes in speech, unsteady on your feet?"     I was lightheaded a month ago. 7. OTHER SYMPTOMS: "Do you have any other symptoms?"     No 8. PREGNANCY: "Is there any chance you are pregnant?" "When was your last menstrual period?"     N/A  Protocols used: Neurologic Deficit-A-AH

## 2022-09-22 NOTE — Telephone Encounter (Signed)
  Chief Complaint: Numbness and tingling in hands and feet for at least 6 months Symptoms: Symptoms seem to be getting worse Frequency: At least 6 months Pertinent Negatives: Patient denies injuries or accidents Disposition: [] ED /[] Urgent Care (no appt availability in office) / [x] Appointment(In office/virtual)/ []  [place] [facility]/ [] Home Care/ [] Refused Recommended Disposition /[]  Mobile Bus/ []  Follow-up with PCP Additional Notes: Appt made with Ricky Stabs, NP for 10/14/2022 at 9:20.

## 2022-09-23 ENCOUNTER — Telehealth: Payer: Self-pay | Admitting: Family

## 2022-09-23 ENCOUNTER — Other Ambulatory Visit: Payer: Self-pay

## 2022-09-23 NOTE — Telephone Encounter (Signed)
Copied from CRM #[id number]. Topic: General - Other >> Sep 22, 2022  3:25 PM Dondra Prader E wrote: Reason for CRM: Pt called to report that he is completely out of his empagliflozin (JARDIANCE) 10 MG TABS tablet

## 2022-09-24 NOTE — Telephone Encounter (Signed)
Requested medication (s) are due for refill today:   Not sure    Has a follow up appt for this on 10/2  Requested medication (s) are on the active medication list:   Yes  Future visit scheduled:   Yes 10/2 follow up    Last ordered: 06/15/2022 #30, 1 refill  Returned because only 2 months worth given and a follow up is due.   Provider to review for continued refills.   Requested Prescriptions  Pending Prescriptions Disp Refills   empagliflozin (JARDIANCE) 10 MG TABS tablet 30 tablet 1    Sig: Take 1 tablet (10 mg total) by mouth daily before breakfast.     Endocrinology:  Diabetes - SGLT2 Inhibitors Failed - 09/22/2022  3:16 PM      Failed - HBA1C is between 0 and 7.9 and within 180 days    Hemoglobin A1C  Date Value Ref Range Status  06/15/2022 15.0 (A) 4.0 - 5.6 % Final   HbA1c, POC (controlled diabetic range)  Date Value Ref Range Status  10/30/2020 10.1 (A) 0.0 - 7.0 % Final   Hgb A1c MFr Bld  Date Value Ref Range Status  06/17/2021 14.8 (H) 4.8 - 5.6 % Final    Comment:             Prediabetes: 5.7 - 6.4          Diabetes: >6.4          Glycemic control for adults with diabetes: <7.0          Passed - Cr in normal range and within 360 days    Creatinine, Ser  Date Value Ref Range Status  06/16/2022 1.02 0.61 - 1.24 mg/dL Final         Passed - eGFR in normal range and within 360 days    GFR calc Af Amer  Date Value Ref Range Status  09/10/2019 >60 >60 mL/min Final   GFR, Estimated  Date Value Ref Range Status  06/16/2022 >60 >60 mL/min Final    Comment:    (NOTE) Calculated using the CKD-EPI Creatinine Equation (2021)    eGFR  Date Value Ref Range Status  06/15/2022 77 >59 mL/min/1.73 Final         Passed - Valid encounter within last 6 months    Recent Outpatient Visits           2 months ago Type 2 diabetes mellitus without complication, with long-term current use of insulin (HCC)   [facility],  Cornelius Moras, RPH-CPP   3 months ago Dysphagia, unspecified type   [facility], Amy J, NP   1 year ago Type 2 diabetes mellitus with hyperglycemia, with long-term current use of insulin [facility])   [facility] [facility], [place], NP   1 year ago Annual physical exam   [facility], Amy J, NP   2 years ago Encounter to establish care   [facility], Salomon Fick, NP       Future Appointments             In 2 weeks Rema Fendt, NP [facility]

## 2022-10-05 ENCOUNTER — Other Ambulatory Visit: Payer: Self-pay

## 2022-10-05 ENCOUNTER — Other Ambulatory Visit: Payer: Self-pay | Admitting: Family Medicine

## 2022-10-05 DIAGNOSIS — E1141 Type 2 diabetes mellitus with diabetic mononeuropathy: Secondary | ICD-10-CM

## 2022-10-06 ENCOUNTER — Other Ambulatory Visit: Payer: Self-pay

## 2022-10-06 MED FILL — Gabapentin Cap 100 MG: ORAL | 30 days supply | Qty: 90 | Fill #0 | Status: CN

## 2022-10-06 NOTE — Telephone Encounter (Signed)
Complete

## 2022-10-14 ENCOUNTER — Other Ambulatory Visit: Payer: Self-pay

## 2022-10-14 ENCOUNTER — Telehealth: Payer: Self-pay | Admitting: Nurse Practitioner

## 2022-10-14 ENCOUNTER — Ambulatory Visit (INDEPENDENT_AMBULATORY_CARE_PROVIDER_SITE_OTHER): Payer: Medicaid Other | Admitting: Family

## 2022-10-14 ENCOUNTER — Encounter: Payer: Self-pay | Admitting: Family

## 2022-10-14 VITALS — BP 111/76 | HR 84 | Temp 98.2°F | Ht 69.0 in | Wt 167.6 lb

## 2022-10-14 DIAGNOSIS — E1165 Type 2 diabetes mellitus with hyperglycemia: Secondary | ICD-10-CM | POA: Diagnosis not present

## 2022-10-14 DIAGNOSIS — Z7984 Long term (current) use of oral hypoglycemic drugs: Secondary | ICD-10-CM

## 2022-10-14 DIAGNOSIS — E1141 Type 2 diabetes mellitus with diabetic mononeuropathy: Secondary | ICD-10-CM

## 2022-10-14 MED ORDER — EMPAGLIFLOZIN 10 MG PO TABS
10.0000 mg | ORAL_TABLET | Freq: Every day | ORAL | 0 refills | Status: DC
Start: 2022-10-14 — End: 2023-01-19
  Filled 2022-10-14: qty 90, 90d supply, fill #0

## 2022-10-14 MED ORDER — GABAPENTIN 100 MG PO CAPS
200.0000 mg | ORAL_CAPSULE | Freq: Three times a day (TID) | ORAL | 0 refills | Status: DC
Start: 2022-10-14 — End: 2022-10-19
  Filled 2022-10-14 (×2): qty 90, 15d supply, fill #0

## 2022-10-14 NOTE — Progress Notes (Signed)
Patient states hands and feet numbness real bad, states pain goes up through calves and into thighs.   Patient states they aren't filling his gabapentin. Asking for higher dosage. Also states he didn't get his jardiance.    Declined Flu shot.

## 2022-10-14 NOTE — Telephone Encounter (Signed)
Error

## 2022-10-14 NOTE — Progress Notes (Signed)
Patient ID: [name] [name], male    DOB: 10-Aug-1970  MRN: [id number]  CC: Numbness/Tingling Hands/Feet  Subjective: [name] [name] is a 52 y.o. male who presents for numbness/tingling hands/feet.   His concerns today include:  Reports numbness/tingling hands/feet persisting. States he is unable to work due to the same. States he is planning to apply for Disability. Reports taking Gabapentin with minimal relief. Needs refills of Jardiance. He is established with Endocrinology for diabetes management.   Patient Active Problem List   Diagnosis Date Noted   MDD (major depressive disorder), recurrent episode, severe (HCC) 09/09/2022   GAD (generalized anxiety disorder) 09/09/2022   Caffeine abuse, continuous (HCC) 09/09/2022   Type 2 diabetes mellitus without complication, without long-term current use of insulin (HCC) 10/21/2018   Paresthesia of left upper and lower extremity 10/07/2018   Shoulder impingement syndrome, right 10/07/2018     Current Outpatient Medications on File Prior to Visit  Medication Sig Dispense Refill   Accu-Chek Softclix Lancets lancets Testing 4 (four) times daily -  before meals and at bedtime. Use as directed. 100 each 5   Blood Glucose Monitoring Suppl (ACCU-CHEK GUIDE) w/Device KIT 1 each by Other route 4 (four) times daily -  before meals and at bedtime. Check blood sugar once daily in the morning before taking Lantus 1 kit 0   Continuous Glucose Sensor (FREESTYLE LIBRE 3 SENSOR) MISC Place 1 sensor on the skin every 14 days. Use to check glucose continuously 2 each 4   FLUoxetine (PROZAC) 20 MG capsule Take 2 capsules (40 mg total) by mouth daily. 60 capsule 1   glucose blood (ACCU-CHEK GUIDE) test strip Testing 4 (four) times daily -  before meals and at bedtime. Use as instructed 100 each 12   hydrOXYzine (VISTARIL) 25 MG capsule Take 1 capsule (25 mg total) by mouth every 8 (eight) hours as needed. 30 capsule 1   insulin glargine (LANTUS SOLOSTAR) 100  UNIT/ML Solostar Pen Inject 25 Units into the skin at bedtime. 30 mL 3   Insulin Pen Needle (TECHLITE PEN NEEDLES) 32G X 4 MM MISC Use as directed daily. 100 each 0   No current facility-administered medications on file prior to visit.    No Known Allergies  Social History   Socioeconomic History   Marital status: Single    Spouse name: Not on file   Number of children: Not on file   Years of education: Not on file   Highest education level: GED or equivalent  Occupational History   Not on file  Tobacco Use   Smoking status: Never    Passive exposure: Current   Smokeless tobacco: Current  Vaping Use   Vaping status: Never Used  Substance and Sexual Activity   Alcohol use: Never   Drug use: Never   Sexual activity: Yes  Other Topics Concern   Not on file  Social History Narrative   Not on file   Social Determinants of Health   Financial Resource Strain: High Risk (06/11/2022)   Overall Financial Resource Strain (CARDIA)    Difficulty of Paying Living Expenses: Very hard  Food Insecurity: Food Insecurity Present (06/11/2022)   Hunger Vital Sign    Worried About Running Out of Food in the Last Year: Often true    Ran Out of Food in the Last Year: Often true  Transportation Needs: Unmet Transportation Needs (06/11/2022)   PRAPARE - Administrator, Civil Service (Medical): Yes    Lack of Transportation (  Non-Medical): Yes  Physical Activity: Not on file  Stress: Not on file  Social Connections: Unknown (06/11/2022)   Social Connection and Isolation Panel [NHANES]    Frequency of Communication with Friends and Family: More than three times a week    Frequency of Social Gatherings with Friends and Family: Twice a week    Attends Religious Services: Patient declined    Database administrator or Organizations: No    Attends Engineer, structural: Not on file    Marital Status: Living with partner  Intimate Partner Violence: Not on file    Family History   Problem Relation Age of Onset   Diabetes Paternal Grandmother    Diabetes Paternal Grandfather    Stroke Paternal Grandfather     No past surgical history on file.  ROS: Review of Systems Negative except as stated above  PHYSICAL EXAM: BP 111/76   Pulse 84   Temp 98.2 F (36.8 C) (Oral)   Ht 5\' 9"  (1.753 m)   Wt 167 lb 9.6 oz (76 kg)   SpO2 96%   BMI 24.75 kg/m   Physical Exam HENT:     Head: Normocephalic and atraumatic.     Nose: Nose normal.     Mouth/Throat:     Mouth: Mucous membranes are moist.     Pharynx: Oropharynx is clear.  Eyes:     Extraocular Movements: Extraocular movements intact.     Conjunctiva/sclera: Conjunctivae normal.     Pupils: Pupils are equal, round, and reactive to light.  Cardiovascular:     Rate and Rhythm: Normal rate and regular rhythm.     Pulses: Normal pulses.     Heart sounds: Normal heart sounds.  Pulmonary:     Effort: Pulmonary effort is normal.     Breath sounds: Normal breath sounds.  Musculoskeletal:        General: Normal range of motion.     Cervical back: Normal range of motion and neck supple.  Neurological:     General: No focal deficit present.     Mental Status: He is alert and oriented to person, place, and time.  Psychiatric:        Mood and Affect: Mood normal.        Behavior: Behavior normal.    ASSESSMENT AND PLAN: 1. Uncontrolled type 2 diabetes mellitus with hyperglycemia (HCC) - Continue present management.  - Continue Empagliflozin as prescribed.  - Discussed the importance of healthy eating habits, low-carbohydrate diet, low-sugar diet, regular aerobic exercise (at least 150 minutes a week as tolerated) and medication compliance to achieve or maintain control of diabetes. - Keep all scheduled appointments with Endocrinology. I discussed with patient Endocrinology to complete Disability paperwork due to related to diabetes. Patient verbalized understanding/agreement.  - empagliflozin (JARDIANCE) 10  MG TABS tablet; Take 1 tablet (10 mg total) by mouth daily before breakfast.  Dispense: 90 tablet; Refill: 0  2. 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 Counseled on medication adherence/adverse effects.  - Keep all scheduled appointments with Endocrinology. - gabapentin (NEURONTIN) 100 MG capsule; Take 2 capsules (200 mg total) by mouth 3 (three) times daily.  Dispense: 90 capsule; Refill: 0   Patient was given the opportunity to ask questions.  Patient verbalized understanding of the plan and was able to repeat key elements of the plan. Patient was given clear instructions to go to Emergency  Department or return to medical center if symptoms don't improve, worsen, or new problems develop.The patient verbalized understanding.   Requested Prescriptions   Signed Prescriptions Disp Refills   gabapentin (NEURONTIN) 100 MG capsule 90 capsule 0    Sig: Take 2 capsules (200 mg total) by mouth 3 (three) times daily.   empagliflozin (JARDIANCE) 10 MG TABS tablet 90 tablet 0    Sig: Take 1 tablet (10 mg total) by mouth daily before breakfast.    Follow-up with primary provider as scheduled.  Rema Fendt, NP

## 2022-10-16 ENCOUNTER — Ambulatory Visit: Payer: Self-pay | Admitting: *Deleted

## 2022-10-16 ENCOUNTER — Other Ambulatory Visit: Payer: Self-pay

## 2022-10-16 NOTE — Telephone Encounter (Signed)
  Chief Complaint: Pt thinks that current rx supply will run out very quickly, and wants provider to be aware. Symptoms: none Frequency:  Pertinent Negatives: Patient denies  Disposition: [] ED /[] Urgent Care (no appt availability in office) / [] Appointment(In office/virtual)/ []  [place] [facility]/ [] Home Care/ [] Refused Recommended Disposition /[] [facility]/ [x]  Follow-up with PCP Additional Notes: Provider wrote rx for Gabapentin 100mg  #90 0 rf. On 10/14/2022.  Pt is taking 2 tablets 3 times daily. This rx is only for a 15 day supply.  Pt is concerned that provider is unaware that the rx will run out quickly.  He would like additional refills called in.   Summary: contact req / rx concern   The patient would like to be contacted by a member of clinical staff about their prescription for Rx #: [id number] gabapentin (NEURONTIN) 100 MG capsule [[id number]]  The patient shares that they were previously told by their PCP that their medication would be increasing  The patient has been in contact with their pharmacy and told that their medication is not available in 150 MG form  The patient is uncertain of what to do regarding their medication and concerned they will run out early  Please contact further when available     Reason for Disposition  [1] Prescription prescribed recently is not at pharmacy AND [2] triager has access to patient's EMR AND [3] prescription is recorded in the EMR  Answer Assessment - Initial Assessment Questions 1. DRUG NAME: "What medicine do you need to have refilled?"     Gabapentin 100mg  2. REFILLS REMAINING: "How many refills are remaining?" (Note: The label on the medicine or pill bottle will show how many refills are remaining. If there are no refills remaining, then a renewal may be needed.)     none  4. PRESCRIBING HCP: "Who prescribed it?" Reason: If prescribed by specialist, call should be referred to that group.     Amy Zonia Kief 5.  SYMPTOMS: "Do you have any symptoms?"     no  Protocols used: Medication Refill and Renewal Call-A-AH

## 2022-10-16 NOTE — Telephone Encounter (Signed)
Summary: contact req / rx concern   The patient would like to be contacted by a member of clinical staff about their prescription for Rx #: [id number] gabapentin (NEURONTIN) 100 MG capsule [[id number]]  The patient shares that they were previously told by their PCP that their medication would be increasing  The patient has been in contact with their pharmacy and told that their medication is not available in 150 MG form  The patient is uncertain of what to do regarding their medication and concerned they will run out early  Please contact further when available         Attempted to call patinet- no answer- call can not be completed at this time message

## 2022-10-19 ENCOUNTER — Other Ambulatory Visit: Payer: Self-pay | Admitting: Family

## 2022-10-19 ENCOUNTER — Other Ambulatory Visit: Payer: Self-pay

## 2022-10-19 DIAGNOSIS — E1141 Type 2 diabetes mellitus with diabetic mononeuropathy: Secondary | ICD-10-CM

## 2022-10-19 MED ORDER — GABAPENTIN 100 MG PO CAPS
200.0000 mg | ORAL_CAPSULE | Freq: Three times a day (TID) | ORAL | 2 refills | Status: AC
Start: 2022-10-19 — End: ?
  Filled 2022-10-19 – 2023-01-04 (×2): qty 180, 30d supply, fill #0
  Filled 2023-02-19: qty 180, 30d supply, fill #1
  Filled 2023-04-14 – 2023-05-05 (×2): qty 180, 30d supply, fill #2

## 2022-10-19 NOTE — Telephone Encounter (Signed)
OK to refill

## 2022-10-19 NOTE — Telephone Encounter (Signed)
Gabapentin prescribed
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
## 2022-11-04 ENCOUNTER — Other Ambulatory Visit: Payer: Self-pay

## 2022-11-04 ENCOUNTER — Ambulatory Visit (INDEPENDENT_AMBULATORY_CARE_PROVIDER_SITE_OTHER): Payer: Medicaid Other | Admitting: Psychiatry

## 2022-11-04 VITALS — BP 128/84 | HR 96 | Temp 98.6°F | Ht 69.0 in | Wt 168.0 lb

## 2022-11-04 DIAGNOSIS — F411 Generalized anxiety disorder: Secondary | ICD-10-CM

## 2022-11-04 DIAGNOSIS — F332 Major depressive disorder, recurrent severe without psychotic features: Secondary | ICD-10-CM

## 2022-11-04 DIAGNOSIS — F6381 Intermittent explosive disorder: Secondary | ICD-10-CM | POA: Diagnosis not present

## 2022-11-04 MED ORDER — FLUOXETINE HCL 20 MG PO CAPS
60.0000 mg | ORAL_CAPSULE | Freq: Every day | ORAL | 1 refills | Status: DC
  Filled 2022-11-04: qty 90, 30d supply, fill #0

## 2022-11-04 MED ORDER — CLONIDINE HCL 0.1 MG PO TABS
0.1000 mg | ORAL_TABLET | Freq: Two times a day (BID) | ORAL | 1 refills | Status: DC
  Filled 2022-11-04: qty 60, 30d supply, fill #0

## 2022-11-04 NOTE — Patient Instructions (Signed)
Start clonidine 0.1 mg at night for 1 week and then start taking twice a day afterwards.  Increase Prozac to 60 mg

## 2022-11-05 ENCOUNTER — Other Ambulatory Visit: Payer: Self-pay

## 2022-11-05 NOTE — Addendum Note (Signed)
Addended by: Tia Masker on: 11/05/2022 03:11 PM   Modules accepted: Level of Service

## 2022-11-25 ENCOUNTER — Telehealth (HOSPITAL_COMMUNITY): Payer: Self-pay | Admitting: Psychiatry

## 2022-11-25 ENCOUNTER — Ambulatory Visit (INDEPENDENT_AMBULATORY_CARE_PROVIDER_SITE_OTHER): Payer: Medicaid Other | Admitting: Mental Health

## 2022-11-25 DIAGNOSIS — F6381 Intermittent explosive disorder: Secondary | ICD-10-CM

## 2022-11-25 DIAGNOSIS — F332 Major depressive disorder, recurrent severe without psychotic features: Secondary | ICD-10-CM

## 2022-11-25 DIAGNOSIS — F411 Generalized anxiety disorder: Secondary | ICD-10-CM
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
## 2022-12-07 NOTE — Progress Notes (Addendum)
Psychiatric Adult Assessment Progress Note  Patient Identification: [name] [name] MRN:  [id number] Date of Evaluation:  12/09/2022 Referral Source: PCP  Assessment:  [name] [name] is a 52 y.o. male with a history of MDD, GAD who presents in person to [facility] for follow-up evaluation of depression.  Patient reports worsening depression for the past few years due to life stressors including being unhoused, unemployed, and having his legal history.  In the last visit, we increased his Prozac to 60 mg and started clonidine to aid with possibly IED. Patient continues to have similar psychosocial barriers like last visit including unstable housing and food which negatively affect his mood. Patient also has difficulty with emotion regulation causing him to become angry easily, more so at night. We will increase his clonidine to aid with night irritability. We also encourage patient to continue going to therapy as this will provide him with coping skills to better regulate emotions. We will continue Prozac at 60 mg to see if it takes time for medication to fully take effect for patient and reassess in the next visit.  Plan:  # MDD Past medication trials:  Status of problem: Uncontrolled Interventions: -- Continue Prozac 60 mg for aid with depression, 30 day supply, 1 refill -- Would benefit from lab work to assess vitamin deficiencies like folate, B12, and vitamin D, will discuss in f/u appointment  # GAD Past medication trials:  Status of problem: Uncontrolled Interventions: -- Continue hydroxyzine 25 mg daily PRN, 30 day supply, 1 refill  # Intermittent Explosive d/o (rule out cluster b personality traits) Past medication trials:  Status of problem: Uncontrolled Interventions: -- Increase clonidine to 0.1 mg qAM and 0.2 mg qPM, 30 day supply, 1 refill  # Caffeine Abuse-resolved Past medication trials:  Status of problem: Controlled Interventions: -- Encourage  gradual taper of half a cup decrease every 5 days  Patient was given contact information for behavioral health clinic and was instructed to call 911 for emergencies.   Subjective:  Chief Complaint:  Chief Complaint  Patient presents with   Medication Refill   Depression   Anxiety    History of Present Illness:   [name] [name] is a 52 y.o. male with a history of MDD, GAD who presents in person to [facility] for follow-up evaluation of depression. Since last appointment, he reports stressors like homelessness and lack of resources for food. He reports still lashing out around around night time. Patient's girl is also in the room and states he lashes out. He states when it gets dark, he states feeling easily irritable. He reports difficulty falling asleep. He states playing games on his phone until 5 AM. He reports the clonidine makes him feel calm. He reports his main issue is feeling angry when things change. When asked for an example, he states he gets upset if people criticize him. He reports seeing a therapist once and it was the initial meeting. He reports his medication makes his feel loopy but unsure which medication causes this.   Denies SI and HI. Regarding AVH, he states sometimes blanking out when he is very angry.   Past Psychiatric History:  Diagnoses: MDD, GAD Medication trials: prozac, hydroxyzine Previous psychiatrist/therapist: previously seeing beginning of this year- tele health Hospitalizations: Denies Suicide attempts: Denies SIB: Denies Hx of violence towards others: Yes Current access to guns: Denies Hx of trauma/abuse: Denies  Substance Abuse History in the last 12 months:  No.  Past Medical History:  Past Medical History:  Diagnosis Date   Diabetes mellitus without complication (HCC)    Diabetes mellitus, type II (HCC)    Hyperlipidemia    No past surgical history on file.  Family Psychiatric History: dementia in the  family  Family History:  Family History  Problem Relation Age of Onset   Diabetes Paternal Grandmother    Diabetes Paternal Grandfather    Stroke Paternal Grandfather     Social History:   Living: [place] lives in car School: GED, some college Job: No Married/Children: Single, 2 children (ages 60 and 82) Support: brother Smoking: Denies Alcohol: Denies  Illicit drugs: denies Legal History: Incarcerated in the past- murder  Social History   Socioeconomic History   Marital status: Single    Spouse name: Not on file   Number of children: Not on file   Years of education: Not on file   Highest education level: GED or equivalent  Occupational History   Not on file  Tobacco Use   Smoking status: Never    Passive exposure: Current   Smokeless tobacco: Current  Vaping Use   Vaping status: Never Used  Substance and Sexual Activity   Alcohol use: Never   Drug use: Never   Sexual activity: Yes  Other Topics Concern   Not on file  Social History Narrative   Not on file   Social Determinants of Health   Financial Resource Strain: High Risk (06/11/2022)   Overall Financial Resource Strain (CARDIA)    Difficulty of Paying Living Expenses: Very hard  Food Insecurity: Food Insecurity Present (06/11/2022)   Hunger Vital Sign    Worried About Running Out of Food in the Last Year: Often true    Ran Out of Food in the Last Year: Often true  Transportation Needs: Unmet Transportation Needs (06/11/2022)   PRAPARE - Administrator, Civil Service (Medical): Yes    Lack of Transportation (Non-Medical): Yes  Physical Activity: Inactive (11/25/2022)   Exercise Vital Sign    Days of Exercise per Week: 0 days    Minutes of Exercise per Session: 0 min  Stress: Stress Concern Present (11/25/2022)   [employer] of Occupational Health - Occupational Stress Questionnaire    Feeling of Stress : Very much  Social Connections: Moderately Integrated (11/25/2022)   Social  Connection and Isolation Panel [NHANES]    Frequency of Communication with Friends and Family: More than three times a week    Frequency of Social Gatherings with Friends and Family: Never    Attends Religious Services: 1 to 4 times per year    Active Member of [employer] or Organizations: No    Attends Banker Meetings: Never    Marital Status: Living with partner    Allergies:  No Known Allergies  Current Medications: Current Outpatient Medications  Medication Sig Dispense Refill   Accu-Chek Softclix Lancets lancets Testing 4 (four) times daily -  before meals and at bedtime. Use as directed. 100 each 5   Blood Glucose Monitoring Suppl (ACCU-CHEK GUIDE) w/Device KIT 1 each by Other route 4 (four) times daily -  before meals and at bedtime. Check blood sugar once daily in the morning before taking Lantus 1 kit 0   cloNIDine (CATAPRES) 0.1 MG tablet Take 1 tablet (0.1 mg total) by mouth in the morning AND 2 tablets (0.2 mg total) at bedtime. 90 tablet 1   Continuous Glucose Sensor (FREESTYLE LIBRE 3 SENSOR) MISC Place 1 sensor on the skin every  14 days. Use to check glucose continuously 2 each 4   empagliflozin (JARDIANCE) 10 MG TABS tablet Take 1 tablet (10 mg total) by mouth daily before breakfast. 90 tablet 0   FLUoxetine (PROZAC) 20 MG capsule Take 3 capsules (60 mg total) by mouth daily. 90 capsule 1   gabapentin (NEURONTIN) 100 MG capsule Take 2 capsules (200 mg total) by mouth 3 (three) times daily. 180 capsule 2   glucose blood (ACCU-CHEK GUIDE) test strip Testing 4 (four) times daily -  before meals and at bedtime. Use as instructed 100 each 12   hydrOXYzine (VISTARIL) 25 MG capsule Take 1 capsule (25 mg total) by mouth daily as needed. 30 capsule 1   insulin glargine (LANTUS SOLOSTAR) 100 UNIT/ML Solostar Pen Inject 25 Units into the skin at bedtime. 30 mL 3   Insulin Pen Needle (TECHLITE PEN NEEDLES) 32G X 4 MM MISC Use as directed daily. 100 each 0   No current  facility-administered medications for this visit.    ROS: Positive peripheral neuropathy  Objective:  Psychiatric Specialty Exam: Blood pressure (!) 116/93, pulse 82, temperature 98.6 F (37 C).There is no height or weight on file to calculate BMI.  General Appearance: Casual and Neat  Eye Contact:  Good  Speech:  Normal Rate  Volume:  Normal  Mood:  Depressed  Affect:  Appropriate and Congruent  Thought Content: Logical   Suicidal Thoughts:  No  Homicidal Thoughts:  No  Thought Process:  Disorganized  Orientation:  Full (Time, Place, and Person)    Memory:  Grossly intact   Judgment:  Fair  Insight:  Fair  Concentration:  Concentration: Good and Attention Span: Good  Recall:  not formally assessed   Fund of Knowledge: Fair  Language: Good  Psychomotor Activity:  Negative  Akathisia:  No  AIMS (if indicated): not done  Assets:  Desire for Improvement Leisure Time Resilience Transportation  ADL's:  Intact  Cognition: WNL  Sleep:  Poor   PE:  General: well-appearing; no acute distress  Pulm: no increased work of breathing on room air  Strength & Muscle Tone: within normal limits Neuro: no focal neurological deficits observed  Gait & Station: normal  Metabolic Disorder Labs: Lab Results  Component Value Date   HGBA1C 15.0 (A) 06/15/2022   MPG 340.75 07/18/2020   No results found for: "PROLACTIN" Lab Results  Component Value Date   CHOL 200 (H) 06/17/2021   TRIG 89 06/17/2021   HDL 55 06/17/2021   CHOLHDL 3.6 06/17/2021   VLDL 28 07/18/2020   LDLCALC 129 (H) 06/17/2021   LDLCALC 116 (H) 10/30/2020   Lab Results  Component Value Date   TSH 1.730 10/30/2020    Therapeutic Level Labs: No results found for: "LITHIUM" No results found for: "CBMZ" No results found for: "VALPROATE"  Screenings:  GAD-7    Flowsheet Row Counselor from 11/25/2022 in [facility] Office Visit from 09/09/2022 in [facility] Office Visit from 06/15/2022 in [facility] Office Visit from 10/30/2020 in [facility] at [facility] from 08/20/2020 in [facility] at [facility]  Total GAD-7 Score 21 13 0 21 21      PHQ2-9    Flowsheet Row Clinical Support from 12/09/2022 in [facility] Counselor from 11/25/2022 in [facility] Clinical Support from 11/04/2022 in [facility] Office Visit  from 10/14/2022 in [facility] at [facility] Office Visit from 09/09/2022 in [facility]  PHQ-2 Total Score 6 6 6 6 6   PHQ-9 Total Score 21 22 22 21 22       Flowsheet Row Clinical Support from 12/09/2022 in [facility] Counselor from 11/25/2022 in [facility] ED from 06/16/2022 in [facility] Emergency Department at [facility]  C-SSRS RISK CATEGORY No Risk No Risk No Risk       Patient/Guardian was advised Release of Information must be obtained prior to any record release in order to collaborate their care with an outside provider. Patient/Guardian was advised if they have not already done so to contact the registration department to sign all necessary forms in order for [place] to release information regarding their care.   Consent: Patient/Guardian gives verbal consent for treatment and assignment of benefits for services provided during this visit. Patient/Guardian expressed understanding and agreed to proceed.   A total of 40 minutes was spent involved in face to face clinical care, chart review, documentation, and medication management.   Lance Muss, MD 11/27/20249:43 PM

## 2022-12-09 ENCOUNTER — Ambulatory Visit (INDEPENDENT_AMBULATORY_CARE_PROVIDER_SITE_OTHER): Payer: Medicaid Other | Admitting: Psychiatry

## 2022-12-09 ENCOUNTER — Other Ambulatory Visit: Payer: Self-pay

## 2022-12-09 VITALS — BP 116/93 | HR 82 | Temp 98.6°F

## 2022-12-09 DIAGNOSIS — F419 Anxiety disorder, unspecified: Secondary | ICD-10-CM

## 2022-12-09 DIAGNOSIS — F332 Major depressive disorder, recurrent severe without psychotic features: Secondary | ICD-10-CM

## 2022-12-09 DIAGNOSIS — F6381 Intermittent explosive disorder: Secondary | ICD-10-CM | POA: Diagnosis not present

## 2022-12-09 DIAGNOSIS — F411 Generalized anxiety disorder: Secondary | ICD-10-CM

## 2022-12-09 MED ORDER — CLONIDINE HCL 0.1 MG PO TABS
0.1000 mg | ORAL_TABLET | Freq: Every day | ORAL | 1 refills | Status: DC
  Filled 2022-12-09 (×2): qty 30, 30d supply, fill #0

## 2022-12-09 MED ORDER — FLUOXETINE HCL 20 MG PO CAPS
60.0000 mg | ORAL_CAPSULE | Freq: Every day | ORAL | 1 refills | Status: DC
  Filled 2022-12-09 – 2023-02-19 (×2): qty 90, 30d supply, fill #0
  Filled 2023-04-14 – 2023-05-05 (×2): qty 90, 30d supply, fill #1

## 2022-12-09 MED ORDER — HYDROXYZINE PAMOATE 25 MG PO CAPS
25.0000 mg | ORAL_CAPSULE | Freq: Every day | ORAL | 1 refills | Status: DC | PRN
Start: 2022-12-09 — End: 2023-09-17
  Filled 2022-12-09: qty 30, 30d supply, fill #0

## 2022-12-09 MED ORDER — CLONIDINE HCL 0.2 MG PO TABS
0.2000 mg | ORAL_TABLET | Freq: Every day | ORAL | 1 refills | Status: DC
  Filled 2022-12-09: qty 30, 30d supply, fill #0

## 2022-12-09 MED ORDER — CLONIDINE HCL 0.1 MG PO TABS
ORAL_TABLET | ORAL | 1 refills | Status: AC
  Filled 2022-12-09: qty 90, 45d supply, fill #0
  Filled 2022-12-09 – 2023-02-19 (×2): qty 90, 30d supply, fill #0
  Filled 2023-04-14 – 2023-05-05 (×2): qty 90, 30d supply, fill #1

## 2022-12-09 NOTE — Patient Instructions (Addendum)
Anger management  Wrath, fury, rage -- whatever you call it, anger is a powerful emotion. Unfortunately, it's often an unhelpful one.  Anger is a natural human experience, and sometimes there are valid reasons to get mad like feeling hurt by something someone said or did or experiencing frustration over a situation at work or home. But uncontrolled anger can be problematic for your personal relationships and for your health.  Fortunately, there are tools you can learn to help you keep your anger in check.  Understanding anger Anger can take different forms. Some people feel angry much of the time, or can't stop dwelling on an event that made them mad. Others get angry less often, but when they do it comes out as explosive bouts of rage.  Whatever shape it takes, uncontrolled anger can negatively affect physical health and emotional wellbeing. Research shows that anger and hostility can increase people's chances of developing coronary heart disease, and lead to worse outcomes in people who already have heart disease. Anger can also lead to stress-related problems including insomnia, digestive problems and headaches.  Anger can also contribute to violent and risky behaviors, including drug and alcohol use. And on top of all that, anger can significantly damage relationships with family, friends and colleagues.  Strategies to keep anger at bay Anger can be caused by internal and external events. You might feel mad at a person, an entity like the company you work for, or an event like a traffic jam or a Emergency planning/management officer. Wherever the feelings come from, you don't have to let your anger get the better of you. Here are some techniques to help you stay calm.  Check yourself. It's hard to make smart choices when you're in the grips of a powerful negative emotion. Rather than trying to talk yourself down from a cliff, avoid climbing it in the first place. Try to identify warning signs that you're starting  to get annoyed. When you recognize the signs, step away from the situation or try relaxation techniques to prevent your irritation from escalating.  Don't dwell. Some people have a tendency to keep rehashing the incident that made them mad. That's an unproductive strategy, especially if you have already resolved the issue that angered you in the first place. Instead, try to let go of the past incident. One way to do that is to focus instead on things you appreciate about the person or the situation that made you angry.  Change the way you think. When you're angry, it's easy to feel like things are worse than they really are. Through a technique known as cognitive restructuring, you can replace unhelpful negative thoughts with more reasonable ones. Instead of thinking "Everything is ruined," for example, tell yourself "This is frustrating, but it's not the end of the world."  Try these strategies to reframe your thinking:  Avoid words like "never" or "always" when talking about yourself or others. Statements like "This never works" or "You're always forgetting things" make you feel your anger is justified. Such statements also alienate people who might otherwise be willing to work with you on a solution. Use logic. Even when it's justified, anger can quickly become irrational. Remind yourself that the world is not out to get you. Do this each time you start feeling angry, and you'll get a more balanced perspective. Translate expectations into desires. Angry people tend to demand things, whether it's fairness, appreciation, agreement or willingness to do things their way. Try to change your demands into  requests. And if things don't go your way, try not to let your disappointment turn into anger. Relax. Simple relaxation strategies, such as deep breathing and relaxing imagery, can help soothe angry feelings. If you practice one or more of these strategies often, it will be easier to apply them when angry  feelings strike.  Focused breathing. Shallow breathing is angry breathing. Practice taking controlled, slow breaths that you picture coming up from your belly rather than your chest. Use imagery. Visualize a relaxing experience from your memory or your imagination. Progressive muscle relaxation. With this technique, you slowly tense then relax each muscle group one at a time. For example, you might start with your toes and slowly work your way up to your head and neck. Improve your communication skills. People often jump to conclusions when they're angry, and they can say the first (often unkind) thing that pops into their heads. Try to stop and listen before reacting. Then take time to think carefully about how you want to reply. If you need to step away to cool down before continuing the conversation, make a promise to come back later to finish the discussion.  Get active. Regular physical exercise can help you decompress, burn off extra tension and reduce stress that can fuel angry outbursts.  Recognize (and avoid) your triggers. Give some thought to the things that make you mad. If you know you always get angry driving downtown at rush hour, take the bus or try to adjust your schedule to make the trip at a less busy time. If you always argue with your spouse at night, avoid bringing up contentious topics when you're both tired. If you're constantly annoyed that your child hasn't cleaned his room, shut the door so you don't have to look at the mess.  You can't completely eliminate angry feelings. But you can make changes to the way those events affect you, and the ways in which you respond. By making the effort to keep your anger in check, you and the people close to you will be happier for the long

## 2022-12-11 ENCOUNTER — Other Ambulatory Visit: Payer: Self-pay

## 2022-12-15 ENCOUNTER — Other Ambulatory Visit: Payer: Self-pay

## 2022-12-15 DIAGNOSIS — Z794 Long term (current) use of insulin: Secondary | ICD-10-CM

## 2022-12-17 NOTE — Addendum Note (Signed)
Addended by: Theodoro Kos A on: 12/17/2022 12:55 PM   Modules accepted: Level of Service

## 2022-12-22 ENCOUNTER — Other Ambulatory Visit: Payer: Self-pay

## 2022-12-23 ENCOUNTER — Ambulatory Visit (INDEPENDENT_AMBULATORY_CARE_PROVIDER_SITE_OTHER): Payer: Medicaid Other | Admitting: Podiatry

## 2022-12-23 ENCOUNTER — Encounter: Payer: Self-pay | Admitting: Podiatry

## 2022-12-23 DIAGNOSIS — E119 Type 2 diabetes mellitus without complications: Secondary | ICD-10-CM | POA: Diagnosis not present

## 2022-12-23 NOTE — Progress Notes (Signed)
Subjective: [name] [name] presents today referred by Rema Fendt, NP for diabetic foot evaluation.  Patient relates many year history of diabetes.  Patient denies any history of foot wounds.  Patient has history of numbness, tingling, burning, pins/needles sensations.  Past Medical History:  Diagnosis Date   Diabetes mellitus without complication (HCC)    Diabetes mellitus, type II (HCC)    Hyperlipidemia     Patient Active Problem List   Diagnosis Date Noted   Intermittent explosive disorder 11/25/2022   MDD (major depressive disorder), recurrent episode, severe (HCC) 09/09/2022   GAD (generalized anxiety disorder) 09/09/2022   Caffeine abuse, continuous (HCC) 09/09/2022   Type 2 diabetes mellitus without complication, without long-term current use of insulin (HCC) 10/21/2018   Paresthesia of left upper and lower extremity 10/07/2018   Shoulder impingement syndrome, right 10/07/2018    No past surgical history on file.  Current Outpatient Medications on File Prior to Visit  Medication Sig Dispense Refill   Accu-Chek Softclix Lancets lancets Testing 4 (four) times daily -  before meals and at bedtime. Use as directed. 100 each 5   Blood Glucose Monitoring Suppl (ACCU-CHEK GUIDE) w/Device KIT 1 each by Other route 4 (four) times daily -  before meals and at bedtime. Check blood sugar once daily in the morning before taking Lantus 1 kit 0   cloNIDine (CATAPRES) 0.1 MG tablet Take 1 tablet (0.1 mg total) by mouth in the morning AND 2 tablets (0.2 mg total) at bedtime. 90 tablet 1   Continuous Glucose Sensor (FREESTYLE LIBRE 3 SENSOR) MISC Place 1 sensor on the skin every 14 days. Use to check glucose continuously 2 each 4   empagliflozin (JARDIANCE) 10 MG TABS tablet Take 1 tablet (10 mg total) by mouth daily before breakfast. 90 tablet 0   FLUoxetine (PROZAC) 20 MG capsule Take 3 capsules (60 mg total) by mouth daily. 90 capsule 1   gabapentin (NEURONTIN) 100 MG capsule Take 2  capsules (200 mg total) by mouth 3 (three) times daily. 180 capsule 2   glucose blood (ACCU-CHEK GUIDE) test strip Testing 4 (four) times daily -  before meals and at bedtime. Use as instructed 100 each 12   hydrOXYzine (VISTARIL) 25 MG capsule Take 1 capsule (25 mg total) by mouth daily as needed. 30 capsule 1   insulin glargine (LANTUS SOLOSTAR) 100 UNIT/ML Solostar Pen Inject 25 Units into the skin at bedtime. 30 mL 3   Insulin Pen Needle (TECHLITE PEN NEEDLES) 32G X 4 MM MISC Use as directed daily. 100 each 0   No current facility-administered medications on file prior to visit.     No Known Allergies  Social History   Occupational History   Not on file  Tobacco Use   Smoking status: Never    Passive exposure: Current   Smokeless tobacco: Current  Vaping Use   Vaping status: Never Used  Substance and Sexual Activity   Alcohol use: Never   Drug use: Never   Sexual activity: Yes    Family History  Problem Relation Age of Onset   Diabetes Paternal Grandmother    Diabetes Paternal Grandfather    Stroke Paternal Grandfather      There is no immunization history on file for this patient.  Review of systems: Positive Findings in bold print.  Constitutional:  chills, fatigue, fever, sweats, weight change Communication: Nurse, learning disability, sign Presenter, broadcasting, hand writing, iPad/Android device Head: headaches, head injury Eyes: changes in vision, eye pain, glaucoma, cataracts, macular  degeneration, diplopia, glare,  light sensitivity, eyeglasses or contacts, blindness Ears nose mouth throat: hearing impaired, hearing aids,  ringing in ears, deaf, sign language,  vertigo, nosebleeds,  rhinitis,  cold sores, snoring, swollen glands Cardiovascular: HTN, edema, arrhythmia, pacemaker in place, defibrillator in place, chest pain/tightness, chronic anticoagulation, blood clot, heart failure, MI Peripheral Vascular: leg cramps, varicose veins, blood clots, lymphedema,  varicosities Respiratory:  asthma, difficulty breathing, denies congestion, SOB, wheezing, cough, emphysema Gastrointestinal: change in appetite or weight, abdominal pain, constipation, diarrhea, nausea, vomiting, vomiting blood, change in bowel habits, abdominal pain, jaundice, rectal bleeding, hemorrhoids, GERD Genitourinary:  nocturia,  pain on urination, polyuria,  blood in urine, Foley catheter, urinary urgency, ESRD on hemodialysis Musculoskeletal: amputation, cramping, stiff joints, painful joints, decreased joint motion, fractures, OA, gout, hemiplegia, paraplegia, uses cane, wheelchair bound, uses walker, uses rollator Skin: +changes in toenails, color change, dryness, itching, mole changes,  rash, wound(s) Neurological: headaches, numbness in feet, paresthesias in feet, burning in feet, fainting,  seizures, change in speech, migraines, memory problems/poor historian, cerebral palsy, weakness, paralysis, CVA, TIA Endocrine: diabetes, hypothyroidism, hyperthyroidism,  goiter, dry mouth, flushing, heat intolerance, cold intolerance,  excessive thirst, denies polyuria,  nocturia Hematological:  easy bleeding, excessive bleeding, easy bruising, enlarged lymph nodes, on long term blood thinner, history of past transusions Allergy/immunological:  hives, eczema, frequent infections, multiple drug allergies, seasonal allergies, transplant recipient, multiple food allergies Psychiatric:  anxiety, depression, mood disorder, suicidal ideations, hallucinations, insomnia  Objective: There were no vitals filed for this visit. Vascular Examination: Capillary refill time less than 3 seconds x 10 digits.  Dorsalis pedis pulses palpable 2 out of 4.  Posterior tibial pulses palpable 2 out of 4.  Digital hair not present x 10 digits.  Skin temperature gradient WNL b/l.  Dermatological Examination: Skin with normal turgor, texture and tone b/l  Toenails 1-5 b/l discolored, thick, dystrophic with  subungual debris and pain with palpation to nailbeds due to thickness of nails.  Musculoskeletal: Muscle strength 5/5 to all LE muscle groups.  Neurological: Sensation intact with 10 gram monofilament.  Vibratory sensation diminished  Assessment: NIDDM Encounter for diabetic foot examination  Plan: Discussed diabetic foot care principles. Literature dispensed on today. Patient to continue soft, supportive shoe gear daily. Patient to report any pedal injuries to medical professional immediately. Follow up 6 months given that his A1c is uncontrolled with last being 14% Patient/POA to call should there be a concern in the interim.

## 2023-01-02 ENCOUNTER — Other Ambulatory Visit: Payer: Self-pay

## 2023-01-02 DIAGNOSIS — E1165 Type 2 diabetes mellitus with hyperglycemia: Secondary | ICD-10-CM

## 2023-01-04 ENCOUNTER — Other Ambulatory Visit: Payer: Medicaid Other

## 2023-01-04 ENCOUNTER — Other Ambulatory Visit: Payer: Self-pay

## 2023-01-05 LAB — COMPREHENSIVE METABOLIC PANEL
AG Ratio: 1.3 (calc) (ref 1.0–2.5)
ALT: 16 U/L (ref 9–46)
AST: 13 U/L (ref 10–35)
Albumin: 4.3 g/dL (ref 3.6–5.1)
Alkaline phosphatase (APISO): 76 U/L (ref 35–144)
BUN: 15 mg/dL (ref 7–25)
CO2: 29 mmol/L (ref 20–32)
Calcium: 9.6 mg/dL (ref 8.6–10.3)
Chloride: 97 mmol/L — ABNORMAL LOW (ref 98–110)
Creat: 1.26 mg/dL (ref 0.70–1.30)
Globulin: 3.4 g/dL (ref 1.9–3.7)
Glucose, Bld: 482 mg/dL — ABNORMAL HIGH (ref 65–99)
Potassium: 4.7 mmol/L (ref 3.5–5.3)
Sodium: 134 mmol/L — ABNORMAL LOW (ref 135–146)
Total Bilirubin: 0.5 mg/dL (ref 0.2–1.2)
Total Protein: 7.7 g/dL (ref 6.1–8.1)

## 2023-01-05 LAB — LIPID PANEL
Cholesterol: 231 mg/dL — ABNORMAL HIGH (ref <200)
HDL: 47 mg/dL (ref >=40)
LDL Cholesterol (Calc): 147 mg/dL — ABNORMAL HIGH
Non-HDL Cholesterol (Calc): 184 mg/dL — ABNORMAL HIGH (ref <130)
Total CHOL/HDL Ratio: 4.9 (calc) (ref <5.0)
Triglycerides: 231 mg/dL — ABNORMAL HIGH (ref <150)

## 2023-01-05 LAB — MICROALBUMIN / CREATININE URINE RATIO
Creatinine, Urine: 44 mg/dL (ref 20–320)
Microalb Creat Ratio: 20 mg/g{creat} (ref <30)
Microalb, Ur: 0.9 mg/dL

## 2023-01-05 LAB — HEMOGLOBIN A1C

## 2023-01-12 ENCOUNTER — Ambulatory Visit (INDEPENDENT_AMBULATORY_CARE_PROVIDER_SITE_OTHER): Payer: Medicaid Other | Admitting: "Endocrinology

## 2023-01-12 ENCOUNTER — Encounter: Payer: Self-pay | Admitting: "Endocrinology

## 2023-01-12 ENCOUNTER — Other Ambulatory Visit: Payer: Self-pay

## 2023-01-12 VITALS — BP 122/70 | HR 87 | Ht 69.0 in | Wt 171.0 lb

## 2023-01-12 DIAGNOSIS — Z7984 Long term (current) use of oral hypoglycemic drugs: Secondary | ICD-10-CM | POA: Diagnosis not present

## 2023-01-12 DIAGNOSIS — Z794 Long term (current) use of insulin: Secondary | ICD-10-CM | POA: Diagnosis not present

## 2023-01-12 DIAGNOSIS — E782 Mixed hyperlipidemia: Secondary | ICD-10-CM

## 2023-01-12 DIAGNOSIS — E1165 Type 2 diabetes mellitus with hyperglycemia: Secondary | ICD-10-CM | POA: Diagnosis not present

## 2023-01-12 LAB — POCT GLYCOSYLATED HEMOGLOBIN (HGB A1C): Hemoglobin A1C: 13.5 % — AB (ref 4.0–5.6)

## 2023-01-12 MED ORDER — INSULIN LISPRO (1 UNIT DIAL) 100 UNIT/ML (KWIKPEN)
5.0000 [IU] | PEN_INJECTOR | Freq: Three times a day (TID) | SUBCUTANEOUS | 1 refills | Status: DC
  Filled 2023-01-12: qty 15, 34d supply, fill #0
  Filled 2023-01-12: qty 39, 87d supply, fill #0
  Filled 2023-02-19: qty 15, 34d supply, fill #1
  Filled 2023-04-14 – 2023-05-05 (×4): qty 15, 34d supply, fill #2

## 2023-01-12 NOTE — Progress Notes (Signed)
 Outpatient Endocrinology Note [name] Birmingham, MD  01/12/23   [name] [name] Jan 01, 1971 [id number]  Referring Provider: Therisa Benton PARAS, NP Primary Care Provider: Lorren Greig PARAS, NP Reason for consultation: Subjective   Assessment & Plan  Diagnoses and all orders for this visit:  Uncontrolled type 2 diabetes mellitus with hyperglycemia (HCC) -     POCT glycosylated hemoglobin (Hb A1C) -     Ambulatory referral to diabetic education  Long term (current) use of oral hypoglycemic drugs  Long-term insulin  use (HCC)  Mixed hypercholesterolemia and hypertriglyceridemia  Other orders -     insulin  lispro (HUMALOG  KWIKPEN) 100 UNIT/ML KwikPen; Inject 5-15 Units into the skin 3 (three) times daily.   Diabetes Type II complicated by neuropathy, No results found for: GFR Hba1c goal less than 7, current Hba1c is 13.5 Lab Results  Component Value Date   HGBA1C 13.5 (A) 01/12/2023   Will recommend the following: Jardiance  10 mg qam  Lantus  25 units once in daytime   Humalog  three times a day before each meal based on correction scale: Correction scale:  151 - 175: 1 unit 176 - 200: 2 units 201 - 225: 3 units 226 - 250: 4 units 251 - 275: 5 units 276 - 300: 6 units 301 - 325: 7 units 326 - 350: 8 units 351 - 375: 9 units 376 - 400: 10 units   No known contraindications/side effects to any of above medications 01/12/23: note of care given to patient as requested  Has libre 3 sensor per epic, but not used? No data available today   -Last LD and Tg are as follows: Lab Results  Component Value Date   LDLCALC 147 (H) 01/04/2023    Lab Results  Component Value Date   TRIG 231 (H) 01/04/2023   -not on statin  -Follow low fat diet and exercise   -Blood pressure goal <140/90 - Microalbumin/creatinine goal is < 30 -Last MA/Cr is as follows: Lab Results  Component Value Date   MICROALBUR 0.9 01/04/2023   -not on ACE/ARB  -diet changes including salt  restriction -limit eating outside -counseled BP targets per standards of diabetes care -uncontrolled blood pressure can lead to retinopathy, nephropathy and cardiovascular and atherosclerotic heart disease  Reviewed and counseled on: -A1C target -Blood sugar targets -Complications of uncontrolled diabetes  -Checking blood sugar before meals and bedtime and bring log next visit -All medications with mechanism of action and side effects -Hypoglycemia management: rule of 15's, Glucagon Emergency Kit and medical alert ID -low-carb low-fat plate-method diet -At least 20 minutes of physical activity per day -Annual dilated retinal eye exam and foot exam -compliance and follow up needs -follow up as scheduled or earlier if problem gets worse  Call if blood sugar is less than 70 or consistently above 250    Take a 15 gm snack of carbohydrate at bedtime before you go to sleep if your blood sugar is less than 100.    If you are going to fast after midnight for a test or procedure, ask your physician for instructions on how to reduce/decrease your insulin  dose.    Call if blood sugar is less than 70 or consistently above 250  -Treating a low sugar by rule of 15  (15 gms of sugar every 15 min until sugar is more than 70) If you feel your sugar is low, test your sugar to be sure If your sugar is low (less than 70), then take 15 grams of a  fast acting Carbohydrate (3-4 glucose tablets or glucose gel or 4 ounces of juice or regular soda) Recheck your sugar 15 min after treating low to make sure it is more than 70 If sugar is still less than 70, treat again with 15 grams of carbohydrate          Don't drive the hour of hypoglycemia  If unconscious/unable to eat or drink by mouth, use glucagon injection or nasal spray baqsimi and call 911. Can repeat again in 15 min if still unconscious.  Return in about 1 week (around 01/19/2023).   I have reviewed current medications, nurse's notes, allergies,  vital signs, past medical and surgical history, family medical history, and social history for this encounter. Counseled patient on symptoms, examination findings, lab findings, imaging results, treatment decisions and monitoring and prognosis. The patient understood the recommendations and agrees with the treatment plan. All questions regarding treatment plan were fully answered.  [name] Birmingham, MD  01/12/23   History of Present Illness [name] [name] is a 52 y.o. year old male who presents for evaluation of Type II diabetes mellitus.  [name] [name] was first diagnosed in 2020.   Diabetes education +  Home diabetes regimen: Jardiance  10 mg qam  Lantus  25 units once in daytime    COMPLICATIONS -  MI/Stroke -  retinopathy +  neuropathy -  nephropathy  SYMPTOMS REVIEWED + Polyuria - Weight loss - Blurred vision  BLOOD SUGAR DATA Not checking BG at home   Physical Exam  BP 122/70   Pulse 87   Ht 5' 9 (1.753 m)   Wt 171 lb (77.6 kg)   SpO2 99%   BMI 25.25 kg/m    Constitutional: well developed, well nourished Head: normocephalic, atraumatic Eyes: sclera anicteric, no redness Neck: supple Lungs: normal respiratory effort Neurology: alert and oriented Skin: dry, no appreciable rashes Musculoskeletal: no appreciable defects Psychiatric: normal mood and affect Diabetic Foot Exam - Simple   No data filed    Current Medications Patient's Medications  New Prescriptions   INSULIN  LISPRO (HUMALOG  KWIKPEN) 100 UNIT/ML KWIKPEN    Inject 5-15 Units into the skin 3 (three) times daily.  Previous Medications   ACCU-CHEK SOFTCLIX LANCETS LANCETS    Testing 4 (four) times daily -  before meals and at bedtime. Use as directed.   BLOOD GLUCOSE MONITORING SUPPL (ACCU-CHEK GUIDE) W/DEVICE KIT    1 each by Other route 4 (four) times daily -  before meals and at bedtime. Check blood sugar once daily in the morning before taking Lantus    CLONIDINE  (CATAPRES ) 0.1 MG TABLET    Take 1  tablet (0.1 mg total) by mouth in the morning AND 2 tablets (0.2 mg total) at bedtime.   CONTINUOUS GLUCOSE SENSOR (FREESTYLE LIBRE 3 SENSOR) MISC    Place 1 sensor on the skin every 14 days. Use to check glucose continuously   EMPAGLIFLOZIN  (JARDIANCE ) 10 MG TABS TABLET    Take 1 tablet (10 mg total) by mouth daily before breakfast.   FLUOXETINE  (PROZAC ) 20 MG CAPSULE    Take 3 capsules (60 mg total) by mouth daily.   GABAPENTIN  (NEURONTIN ) 100 MG CAPSULE    Take 2 capsules (200 mg total) by mouth 3 (three) times daily.   GLUCOSE BLOOD (ACCU-CHEK GUIDE) TEST STRIP    Testing 4 (four) times daily -  before meals and at bedtime. Use as instructed   HYDROXYZINE  (VISTARIL ) 25 MG CAPSULE    Take 1 capsule (25 mg total) by  mouth daily as needed.   INSULIN  GLARGINE (LANTUS  SOLOSTAR) 100 UNIT/ML SOLOSTAR PEN    Inject 25 Units into the skin at bedtime.   INSULIN  PEN NEEDLE (TECHLITE PEN NEEDLES) 32G X 4 MM MISC    Use as directed daily.  Modified Medications   No medications on file  Discontinued Medications   No medications on file    Allergies No Known Allergies  Past Medical History Past Medical History:  Diagnosis Date   Diabetes mellitus without complication (HCC)    Diabetes mellitus, type II (HCC)    Hyperlipidemia     Past Surgical History History reviewed. No pertinent surgical history.  Family History family history includes Diabetes in his paternal grandfather and paternal grandmother; Stroke in his paternal grandfather.  Social History Social History   Socioeconomic History   Marital status: Single    Spouse name: Not on file   Number of children: Not on file   Years of education: Not on file   Highest education level: GED or equivalent  Occupational History   Not on file  Tobacco Use   Smoking status: Never    Passive exposure: Current   Smokeless tobacco: Current  Vaping Use   Vaping status: Never Used  Substance and Sexual Activity   Alcohol use: Never    Drug use: Never   Sexual activity: Yes  Other Topics Concern   Not on file  Social History Narrative   Not on file   Social Drivers of Health   Financial Resource Strain: High Risk (06/11/2022)   Overall Financial Resource Strain (CARDIA)    Difficulty of Paying Living Expenses: Very hard  Food Insecurity: Food Insecurity Present (06/11/2022)   Hunger Vital Sign    Worried About Running Out of Food in the Last Year: Often true    Ran Out of Food in the Last Year: Often true  Transportation Needs: Unmet Transportation Needs (06/11/2022)   PRAPARE - Administrator, Civil Service (Medical): Yes    Lack of Transportation (Non-Medical): Yes  Physical Activity: Inactive (11/25/2022)   Exercise Vital Sign    Days of Exercise per Week: 0 days    Minutes of Exercise per Session: 0 min  Stress: Stress Concern Present (11/25/2022)   [employer] of Occupational Health - Occupational Stress Questionnaire    Feeling of Stress : Very much  Social Connections: Moderately Integrated (11/25/2022)   Social Connection and Isolation Panel [NHANES]    Frequency of Communication with Friends and Family: More than three times a week    Frequency of Social Gatherings with Friends and Family: Never    Attends Religious Services: 1 to 4 times per year    Active Member of [employer] or Organizations: No    Attends Banker Meetings: Never    Marital Status: Living with partner  Intimate Partner Violence: Not At Risk (11/25/2022)   Humiliation, Afraid, Rape, and Kick questionnaire    Fear of Current or Ex-Partner: No    Emotionally Abused: No    Physically Abused: No    Sexually Abused: No    Lab Results  Component Value Date   HGBA1C 13.5 (A) 01/12/2023   HGBA1C CANCELED 01/04/2023   HGBA1C 15.0 (A) 06/15/2022   Lab Results  Component Value Date   CHOL 231 (H) 01/04/2023   Lab Results  Component Value Date   HDL 47 01/04/2023   Lab Results  Component Value Date    LDLCALC 147 (H) 01/04/2023  Lab Results  Component Value Date   TRIG 231 (H) 01/04/2023   Lab Results  Component Value Date   CHOLHDL 4.9 01/04/2023   Lab Results  Component Value Date   CREATININE 1.26 01/04/2023   No results found for: GFR Lab Results  Component Value Date   MICROALBUR 0.9 01/04/2023      Component Value Date/Time   NA 134 (L) 01/04/2023 0949   NA 133 (L) 06/15/2022 1436   K 4.7 01/04/2023 0949   CL 97 (L) 01/04/2023 0949   CO2 29 01/04/2023 0949   GLUCOSE 482 (H) 01/04/2023 0949   BUN 15 01/04/2023 0949   BUN 8 06/15/2022 1436   CREATININE 1.26 01/04/2023 0949   CALCIUM  9.6 01/04/2023 0949   PROT 7.7 01/04/2023 0949   PROT 6.7 10/30/2020 1322   ALBUMIN 4.4 10/30/2020 1322   AST 13 01/04/2023 0949   ALT 16 01/04/2023 0949   ALKPHOS 56 10/30/2020 1322   BILITOT 0.5 01/04/2023 0949   BILITOT 0.4 10/30/2020 1322   GFRNONAA >60 06/16/2022 1016   GFRAA >60 09/10/2019 0533      Latest Ref Rng & Units 01/04/2023    9:49 AM 06/16/2022   10:16 AM 06/15/2022    2:36 PM  BMP  Glucose 65 - 99 mg/dL 517  332  450   BUN 7 - 25 mg/dL 15  10  8    Creatinine 0.70 - 1.30 mg/dL 8.73  8.97  8.84   BUN/Creat Ratio 6 - 22 (calc) SEE NOTE:   7   Sodium 135 - 146 mmol/L 134  130  133   Potassium 3.5 - 5.3 mmol/L 4.7  3.8  4.6   Chloride 98 - 110 mmol/L 97  96  94   CO2 20 - 32 mmol/L 29  24  21    Calcium  8.6 - 10.3 mg/dL 9.6  9.6  9.6        Component Value Date/Time   WBC 6.2 06/16/2022 1016   RBC 5.14 06/16/2022 1016   HGB 15.1 06/16/2022 1016   HGB 17.6 10/07/2018 1012   HCT 43.7 06/16/2022 1016   HCT 50.7 10/07/2018 1012   PLT 193 06/16/2022 1016   PLT 249 10/07/2018 1012   MCV 85.0 06/16/2022 1016   MCV 85 10/07/2018 1012   MCH 29.4 06/16/2022 1016   MCHC 34.6 06/16/2022 1016   RDW 12.4 06/16/2022 1016   RDW 12.6 10/07/2018 1012   LYMPHSABS 1.6 01/26/2021 2201   MONOABS 1.1 (H) 01/26/2021 2201   EOSABS 0.1 01/26/2021 2201   BASOSABS 0.0  01/26/2021 2201     Parts of this note may have been dictated using voice recognition software. There may be variances in spelling and vocabulary which are unintentional. Not all errors are proofread. Please notify the dino if any discrepancies are noted or if the meaning of any statement is not clear.

## 2023-01-12 NOTE — Patient Instructions (Addendum)
 Jardiance  10 mg qam  Lantus  25 units once in daytime   Humalog  three times a day 15 min before each meal based on correction scale: Correction scale:  151 - 175: 1 unit 176 - 200: 2 units 201 - 225: 3 units 226 - 250: 4 units 251 - 275: 5 units 276 - 300: 6 units 301 - 325: 7 units 326 - 350: 8 units 351 - 375: 9 units 376 - 400: 10 units   _______________   Goals of DM therapy:  Morning Fasting blood sugar: 80-140  Blood sugar before meals: 80-140 Bed time blood sugar: 100-150  A1C <7%, limited only by hypoglycemia  1.Diabetes medications and their side effects discussed, including hypoglycemia    2. Check blood glucose:  a) Always check blood sugars before driving. Please see below (under hypoglycemia) on how to manage b) Check a minimum of 3 times/day or more as needed when having symptoms of hypoglycemia.   c) Try to check blood glucose before sleeping/in the middle of the night to ensure that it is remaining stable and not dropping less than 100 d) Check blood glucose more often if sick  3. Diet: a) 3 meals per day schedule b: Restrict carbs to 60-70 grams (4 servings) per meal c) Colorful vegetables - 3 servings a day, and low sugar fruit 2 servings/day Plate control method: 1/4 plate protein, 1/4 starch, 1/2 green, yellow, or red vegetables d) Avoid carbohydrate snacks unless hypoglycemic episode, or increased physical activity  4. Regular exercise as tolerated, preferably 3 or more hours a week  5. Hypoglycemia: a)  Do not drive or operate machinery without first testing blood glucose to assure it is over 90 mg%, or if dizzy, lightheaded, not feeling normal, etc, or  if foot or leg is numb or weak. b)  If blood glucose less than 70, take four 5gm Glucose tabs or 15-30 gm Glucose gel.  Repeat every 15 min as needed until blood sugar is >100 mg/dl. If hypoglycemia persists then call 911.   6. Sick day management: a) Check blood glucose more often b) Continue  usual therapy if blood sugars are elevated.   7. Contact the doctor immediately if blood glucose is frequently <60 mg/dl, or an episode of severe hypoglycemia occurs (where someone had to give you glucose/  glucagon or if you passed out from a low blood glucose), or if blood glucose is persistently >350 mg/dl, for further management  8. A change in level of physical activity or exercise and a change in diet may also affect your blood sugar. Check blood sugars more often and call if needed.  Instructions: 1. Bring glucose meter, blood glucose records on every visit for review 2. Continue to follow up with primary care physician and other providers for medical care 3. Yearly eye  and foot exam 4. Please get blood work done prior to the next appointment

## 2023-01-15 ENCOUNTER — Other Ambulatory Visit: Payer: Self-pay

## 2023-01-18 NOTE — Progress Notes (Deleted)
 Psychiatric Adult Assessment Progress Note  Patient Identification: [name] [name] MRN:  [id number] Date of Evaluation:  01/18/2023 Referral Source: PCP  Assessment:  [name] [name] is a 53 y.o. male with a history of MDD, GAD who presents in person to [facility] for follow-up evaluation of depression.  Patient reports worsening depression for the past few years due to life stressors including being unhoused, unemployed, and having his legal history.  In the last visit, we continued Prozac  at 60 mg to see if it takes time for medication to fully take effect and we increased clonidine . Today, he ***  Plan:  # MDD Past medication trials:  Status of problem: Ongoing Interventions: -- Continue Prozac  60 mg for aid with depression, 30 day supply, 1 refill -- Would benefit from lab work to assess vitamin deficiencies like folate, B12, and vitamin D, will discuss in f/u appointment  # GAD Past medication trials:  Status of problem: Ongoing Interventions: -- Continue hydroxyzine  25 mg daily PRN, 30 day supply, 1 refill  # Intermittent Explosive d/o (rule out cluster b personality traits) Past medication trials:  Status of problem: Ongoing Interventions: -- Increase clonidine  to 0.1 mg qAM and 0.2 mg qPM, 30 day supply, 1 refill  # Caffeine Abuse-resolved Past medication trials:  Status of problem: Controlled Interventions: -- Encourage gradual taper of half a cup decrease every 5 days  Patient was given contact information for behavioral health clinic and was instructed to call 911 for emergencies.   Subjective:  Chief Complaint:  No chief complaint on file.   History of Present Illness:   [name] [name] is a 53 y.o. male with a history of MDD, GAD who presents in person to [facility] for follow-up evaluation of depression. Since last appointment, he reports ***   Past Psychiatric History:  Diagnoses: MDD, GAD Medication trials: prozac ,  hydroxyzine  Previous psychiatrist/therapist: previously seeing beginning of this year- tele health Hospitalizations: Denies Suicide attempts: Denies SIB: Denies Hx of violence towards others: Yes Current access to guns: Denies Hx of trauma/abuse: Denies  Substance Abuse History in the last 12 months:  No.  Past Medical History:  Past Medical History:  Diagnosis Date   Diabetes mellitus without complication (HCC)    Diabetes mellitus, type II (HCC)    Hyperlipidemia    No past surgical history on file.  Family Psychiatric History: dementia in the family  Family History:  Family History  Problem Relation Age of Onset   Diabetes Paternal Grandmother    Diabetes Paternal Grandfather    Stroke Paternal Grandfather     Social History:   Living: [place] lives in car School: GED, some college Job: No Married/Children: Single, 2 children (ages 56 and 89) Support: brother Smoking: Denies Alcohol: Denies  Illicit drugs: denies Legal History: Incarcerated in the past- murder  Social History   Socioeconomic History   Marital status: Single    Spouse name: Not on file   Number of children: Not on file   Years of education: Not on file   Highest education level: GED or equivalent  Occupational History   Not on file  Tobacco Use   Smoking status: Never    Passive exposure: Current   Smokeless tobacco: Current  Vaping Use   Vaping status: Never Used  Substance and Sexual Activity   Alcohol use: Never   Drug use: Never   Sexual activity: Yes  Other Topics Concern   Not on file  Social History Narrative  Not on file   Social Drivers of Health   Financial Resource Strain: High Risk (06/11/2022)   Overall Financial Resource Strain (CARDIA)    Difficulty of Paying Living Expenses: Very hard  Food Insecurity: Food Insecurity Present (06/11/2022)   Hunger Vital Sign    Worried About Running Out of Food in the Last Year: Often true    Ran Out of Food in the Last Year:  Often true  Transportation Needs: Unmet Transportation Needs (06/11/2022)   PRAPARE - Administrator, Civil Service (Medical): Yes    Lack of Transportation (Non-Medical): Yes  Physical Activity: Inactive (11/25/2022)   Exercise Vital Sign    Days of Exercise per Week: 0 days    Minutes of Exercise per Session: 0 min  Stress: Stress Concern Present (11/25/2022)   [employer] of Occupational Health - Occupational Stress Questionnaire    Feeling of Stress : Very much  Social Connections: Moderately Integrated (11/25/2022)   Social Connection and Isolation Panel [NHANES]    Frequency of Communication with Friends and Family: More than three times a week    Frequency of Social Gatherings with Friends and Family: Never    Attends Religious Services: 1 to 4 times per year    Active Member of [employer] or Organizations: No    Attends Banker Meetings: Never    Marital Status: Living with partner    Allergies:  No Known Allergies  Current Medications: Current Outpatient Medications  Medication Sig Dispense Refill   Accu-Chek Softclix Lancets lancets Testing 4 (four) times daily -  before meals and at bedtime. Use as directed. 100 each 5   Blood Glucose Monitoring Suppl (ACCU-CHEK GUIDE) w/Device KIT 1 each by Other route 4 (four) times daily -  before meals and at bedtime. Check blood sugar once daily in the morning before taking Lantus  1 kit 0   cloNIDine  (CATAPRES ) 0.1 MG tablet Take 1 tablet (0.1 mg total) by mouth in the morning AND 2 tablets (0.2 mg total) at bedtime. 90 tablet 1   Continuous Glucose Sensor (FREESTYLE LIBRE 3 SENSOR) MISC Place 1 sensor on the skin every 14 days. Use to check glucose continuously 2 each 4   empagliflozin  (JARDIANCE ) 10 MG TABS tablet Take 1 tablet (10 mg total) by mouth daily before breakfast. 90 tablet 0   FLUoxetine  (PROZAC ) 20 MG capsule Take 3 capsules (60 mg total) by mouth daily. 90 capsule 1   gabapentin  (NEURONTIN ) 100  MG capsule Take 2 capsules (200 mg total) by mouth 3 (three) times daily. 180 capsule 2   glucose blood (ACCU-CHEK GUIDE) test strip Testing 4 (four) times daily -  before meals and at bedtime. Use as instructed 100 each 12   hydrOXYzine  (VISTARIL ) 25 MG capsule Take 1 capsule (25 mg total) by mouth daily as needed. 30 capsule 1   insulin  glargine (LANTUS  SOLOSTAR) 100 UNIT/ML Solostar Pen Inject 25 Units into the skin at bedtime. 30 mL 3   insulin  lispro (HUMALOG  KWIKPEN) 100 UNIT/ML KwikPen Inject 5-15 Units into the skin 3 (three) times daily. 42 mL 1   Insulin  Pen Needle (TECHLITE PEN NEEDLES) 32G X 4 MM MISC Use as directed daily. 100 each 0   No current facility-administered medications for this visit.    ROS: Positive peripheral neuropathy  Objective:  Psychiatric Specialty Exam: There were no vitals taken for this visit.There is no height or weight on file to calculate BMI.  General Appearance: Casual and Neat  Eye Contact:  Good  Speech:  Normal Rate  Volume:  Normal  Mood:  Depressed  Affect:  Appropriate and Congruent  Thought Content: Logical   Suicidal Thoughts:  No  Homicidal Thoughts:  No  Thought Process:  Disorganized  Orientation:  Full (Time, Place, and Person)    Memory:  Grossly intact   Judgment:  Fair  Insight:  Fair  Concentration:  Concentration: Good and Attention Span: Good  Recall:  not formally assessed   Fund of Knowledge: Fair  Language: Good  Psychomotor Activity:  Negative  Akathisia:  No  AIMS (if indicated): not done  Assets:  Desire for Improvement Leisure Time Resilience Transportation  ADL's:  Intact  Cognition: WNL  Sleep:  Poor   Physical Exam *** General: Pleasant, well-appearing ***. No acute distress. Pulmonary: Normal effort. No wheezing or rales. Skin: No obvious rash or lesions. Neuro: A&Ox3.No focal deficit.   Review of Systems *** No reported symptoms   Metabolic Disorder Labs: Lab Results  Component Value  Date   HGBA1C 13.5 (A) 01/12/2023   MPG 340.75 07/18/2020   No results found for: PROLACTIN Lab Results  Component Value Date   CHOL 231 (H) 01/04/2023   TRIG 231 (H) 01/04/2023   HDL 47 01/04/2023   CHOLHDL 4.9 01/04/2023   VLDL 28 07/18/2020   LDLCALC 147 (H) 01/04/2023   LDLCALC 129 (H) 06/17/2021   Lab Results  Component Value Date   TSH 1.730 10/30/2020    Therapeutic Level Labs: No results found for: LITHIUM No results found for: CBMZ No results found for: VALPROATE  Screenings:  GAD-7    Flowsheet Row Counselor from 11/25/2022 in [facility] Office Visit from 09/09/2022 in [facility] Office Visit from 06/15/2022 in [facility] Primary Care at [facility] Office Visit from 10/30/2020 in [facility] Primary Care at [facility] from 08/20/2020 in [facility] Primary Care at [facility]  Total GAD-7 Score 21 13 0 21 21      PHQ2-9    Flowsheet Row Clinical Support from 12/09/2022 in [facility] Counselor from 11/25/2022 in [facility] Clinical Support from 11/04/2022 in [facility] Office Visit from 10/14/2022 in [facility] Primary Care at [facility] Office Visit from 09/09/2022 in [facility]  PHQ-2 Total Score 6 6 6 6 6   PHQ-9 Total Score 21 22 22 21 22       Flowsheet Row Clinical Support from 12/09/2022 in [facility] Counselor from 11/25/2022 in [facility] ED from 06/16/2022 in [facility] Emergency Department at [facility]  C-SSRS RISK CATEGORY No Risk No Risk No Risk       Consent: Patient/Guardian gives verbal consent for treatment and assignment of benefits for services provided during this visit. Patient/Guardian expressed understanding and agreed to proceed.   A total of 30  minutes was spent involved in face to face clinical care, chart review, documentation, and medication management.   Ismael KATHEE Franco, MD 1/6/20259:37 AM

## 2023-01-19 ENCOUNTER — Ambulatory Visit (INDEPENDENT_AMBULATORY_CARE_PROVIDER_SITE_OTHER): Payer: Medicaid Other | Admitting: "Endocrinology

## 2023-01-19 ENCOUNTER — Encounter: Payer: Self-pay | Admitting: "Endocrinology

## 2023-01-19 ENCOUNTER — Other Ambulatory Visit: Payer: Self-pay

## 2023-01-19 ENCOUNTER — Other Ambulatory Visit (HOSPITAL_BASED_OUTPATIENT_CLINIC_OR_DEPARTMENT_OTHER): Payer: Self-pay

## 2023-01-19 VITALS — BP 110/76 | HR 76 | Ht 69.0 in | Wt 176.0 lb

## 2023-01-19 DIAGNOSIS — Z794 Long term (current) use of insulin: Secondary | ICD-10-CM | POA: Diagnosis not present

## 2023-01-19 DIAGNOSIS — E1165 Type 2 diabetes mellitus with hyperglycemia: Secondary | ICD-10-CM

## 2023-01-19 DIAGNOSIS — Z7984 Long term (current) use of oral hypoglycemic drugs: Secondary | ICD-10-CM | POA: Diagnosis not present

## 2023-01-19 DIAGNOSIS — E782 Mixed hyperlipidemia: Secondary | ICD-10-CM | POA: Diagnosis not present

## 2023-01-19 MED ORDER — EMPAGLIFLOZIN 25 MG PO TABS
25.0000 mg | ORAL_TABLET | Freq: Every day | ORAL | 1 refills | Status: DC
  Filled 2023-01-19 – 2023-01-29 (×2): qty 90, 90d supply, fill #0
  Filled 2023-06-29 – 2023-09-07 (×2): qty 90, 90d supply, fill #1

## 2023-01-19 NOTE — Patient Instructions (Signed)

## 2023-01-19 NOTE — Progress Notes (Signed)
 Outpatient Endocrinology Note [name] Birmingham, MD  01/19/23   [name] [name] 16-Jun-1970 [id number]  Referring Provider: Lorren Greig PARAS, NP Primary Care Provider: Lorren Greig PARAS, NP Reason for consultation: Subjective   Assessment & Plan  M[place] was seen today for follow-up.  Diagnoses and all orders for this visit:  Uncontrolled type 2 diabetes mellitus with hyperglycemia (HCC)  Long term (current) use of oral hypoglycemic drugs  Long-term insulin  use (HCC)  Mixed hypercholesterolemia and hypertriglyceridemia  Other orders -     empagliflozin  (JARDIANCE ) 25 MG TABS tablet; Take 1 tablet (25 mg total) by mouth daily before breakfast.    Diabetes Type II complicated by neuropathy, No results found for: GFR Hba1c goal less than 7, current Hba1c is 13.5 Lab Results  Component Value Date   HGBA1C 13.5 (A) 01/12/2023   Will recommend the following: Jardiance  25 mg qam  Lantus  25 units once in daytime   Humalog  10-14 units three times a day before each meal (10 units for small meal, 12 units for medium meal, 14 units for large meal)  Humalog  Correction scale:  151 - 175: 1 unit 176 - 200: 2 units 201 - 225: 3 units 226 - 250: 4 units 251 - 275: 5 units 276 - 300: 6 units 301 - 325: 7 units 326 - 350: 8 units 351 - 375: 9 units 376 - 400: 10 units   No known contraindications/side effects to any of above medications Has libre 3 sensor   -Last LD and Tg are as follows: Lab Results  Component Value Date   LDLCALC 147 (H) 01/04/2023    Lab Results  Component Value Date   TRIG 231 (H) 01/04/2023   -not on statin  -Follow low fat diet and exercise   -Blood pressure goal <140/90 - Microalbumin/creatinine goal is < 30 -Last MA/Cr is as follows: Lab Results  Component Value Date   MICROALBUR 0.9 01/04/2023   -not on ACE/ARB  -diet changes including salt restriction -limit eating outside -counseled BP targets per standards of diabetes  care -uncontrolled blood pressure can lead to retinopathy, nephropathy and cardiovascular and atherosclerotic heart disease  Reviewed and counseled on: -A1C target -Blood sugar targets -Complications of uncontrolled diabetes  -Checking blood sugar before meals and bedtime and bring log next visit -All medications with mechanism of action and side effects -Hypoglycemia management: rule of 15's, Glucagon Emergency Kit and medical alert ID -low-carb low-fat plate-method diet -At least 20 minutes of physical activity per day -Annual dilated retinal eye exam and foot exam -compliance and follow up needs -follow up as scheduled or earlier if problem gets worse  Call if blood sugar is less than 70 or consistently above 250    Take a 15 gm snack of carbohydrate at bedtime before you go to sleep if your blood sugar is less than 100.    If you are going to fast after midnight for a test or procedure, ask your physician for instructions on how to reduce/decrease your insulin  dose.    Call if blood sugar is less than 70 or consistently above 250  -Treating a low sugar by rule of 15  (15 gms of sugar every 15 min until sugar is more than 70) If you feel your sugar is low, test your sugar to be sure If your sugar is low (less than 70), then take 15 grams of a fast acting Carbohydrate (3-4 glucose tablets or glucose gel or 4 ounces of juice or regular  soda) Recheck your sugar 15 min after treating low to make sure it is more than 70 If sugar is still less than 70, treat again with 15 grams of carbohydrate          Don't drive the hour of hypoglycemia  If unconscious/unable to eat or drink by mouth, use glucagon injection or nasal spray baqsimi and call 911. Can repeat again in 15 min if still unconscious.  Return in about 2 weeks (around 02/02/2023).   I have reviewed current medications, nurse's notes, allergies, vital signs, past medical and surgical history, family medical history, and social  history for this encounter. Counseled patient on symptoms, examination findings, lab findings, imaging results, treatment decisions and monitoring and prognosis. The patient understood the recommendations and agrees with the treatment plan. All questions regarding treatment plan were fully answered.  [name] Birmingham, MD  01/19/23   History of Present Illness [name] [name] is a 53 y.o. year old male who presents for evaluation of Type II diabetes mellitus.  [name] [name] was first diagnosed in 2020.   Diabetes education +  Home diabetes regimen: Humalog  10 three times a day Jardiance  10 mg qam  Lantus  25 units once in daytime    COMPLICATIONS -  MI/Stroke -  retinopathy +  neuropathy -  nephropathy  SYMPTOMS REVIEWED + Polyuria - Weight loss - Blurred vision  BLOOD SUGAR DATA  CGM interpretation: At today's visit, we reviewed her CGM downloads. The full report is scanned in the media. Reviewing the CGM trends, BG are elevated most of the time.  Physical Exam  BP 110/76 (BP Location: Left Arm, Patient Position: Sitting, Cuff Size: Normal)   Pulse 76   Ht 5' 9 (1.753 m)   Wt 176 lb (79.8 kg)   SpO2 97%   BMI 25.99 kg/m    Constitutional: well developed, well nourished Head: normocephalic, atraumatic Eyes: sclera anicteric, no redness Neck: supple Lungs: normal respiratory effort Neurology: alert and oriented Skin: dry, no appreciable rashes Musculoskeletal: no appreciable defects Psychiatric: normal mood and affect Diabetic Foot Exam - Simple   No data filed    Current Medications Patient's Medications  New Prescriptions   EMPAGLIFLOZIN  (JARDIANCE ) 25 MG TABS TABLET    Take 1 tablet (25 mg total) by mouth daily before breakfast.  Previous Medications   ACCU-CHEK SOFTCLIX LANCETS LANCETS    Testing 4 (four) times daily -  before meals and at bedtime. Use as directed.   BLOOD GLUCOSE MONITORING SUPPL (ACCU-CHEK GUIDE) W/DEVICE KIT    1 each by Other route 4 (four)  times daily -  before meals and at bedtime. Check blood sugar once daily in the morning before taking Lantus    CLONIDINE  (CATAPRES ) 0.1 MG TABLET    Take 1 tablet (0.1 mg total) by mouth in the morning AND 2 tablets (0.2 mg total) at bedtime.   CONTINUOUS GLUCOSE SENSOR (FREESTYLE LIBRE 3 SENSOR) MISC    Place 1 sensor on the skin every 14 days. Use to check glucose continuously   FLUOXETINE  (PROZAC ) 20 MG CAPSULE    Take 3 capsules (60 mg total) by mouth daily.   GABAPENTIN  (NEURONTIN ) 100 MG CAPSULE    Take 2 capsules (200 mg total) by mouth 3 (three) times daily.   GLUCOSE BLOOD (ACCU-CHEK GUIDE) TEST STRIP    Testing 4 (four) times daily -  before meals and at bedtime. Use as instructed   HYDROXYZINE  (VISTARIL ) 25 MG CAPSULE    Take 1 capsule (25 mg  total) by mouth daily as needed.   INSULIN  GLARGINE (LANTUS  SOLOSTAR) 100 UNIT/ML SOLOSTAR PEN    Inject 25 Units into the skin at bedtime.   INSULIN  LISPRO (HUMALOG  KWIKPEN) 100 UNIT/ML KWIKPEN    Inject 5-15 Units into the skin 3 (three) times daily.   INSULIN  PEN NEEDLE (TECHLITE PEN NEEDLES) 32G X 4 MM MISC    Use as directed daily.   TRAMADOL (ULTRAM) 50 MG TABLET    Take 50 mg by mouth every 6 (six) hours as needed.  Modified Medications   No medications on file  Discontinued Medications   EMPAGLIFLOZIN  (JARDIANCE ) 10 MG TABS TABLET    Take 1 tablet (10 mg total) by mouth daily before breakfast.    Allergies No Known Allergies  Past Medical History Past Medical History:  Diagnosis Date   Diabetes mellitus without complication (HCC)    Diabetes mellitus, type II (HCC)    Hyperlipidemia     Past Surgical History History reviewed. No pertinent surgical history.  Family History family history includes Diabetes in his paternal grandfather and paternal grandmother; Stroke in his paternal grandfather.  Social History Social History   Socioeconomic History   Marital status: Single    Spouse name: Not on file   Number of children:  Not on file   Years of education: Not on file   Highest education level: GED or equivalent  Occupational History   Not on file  Tobacco Use   Smoking status: Never    Passive exposure: Current   Smokeless tobacco: Current  Vaping Use   Vaping status: Never Used  Substance and Sexual Activity   Alcohol use: Never   Drug use: Never   Sexual activity: Yes  Other Topics Concern   Not on file  Social History Narrative   Not on file   Social Drivers of Health   Financial Resource Strain: High Risk (06/11/2022)   Overall Financial Resource Strain (CARDIA)    Difficulty of Paying Living Expenses: Very hard  Food Insecurity: Food Insecurity Present (06/11/2022)   Hunger Vital Sign    Worried About Running Out of Food in the Last Year: Often true    Ran Out of Food in the Last Year: Often true  Transportation Needs: Unmet Transportation Needs (06/11/2022)   PRAPARE - Administrator, Civil Service (Medical): Yes    Lack of Transportation (Non-Medical): Yes  Physical Activity: Inactive (11/25/2022)   Exercise Vital Sign    Days of Exercise per Week: 0 days    Minutes of Exercise per Session: 0 min  Stress: Stress Concern Present (11/25/2022)   [employer] of Occupational Health - Occupational Stress Questionnaire    Feeling of Stress : Very much  Social Connections: Moderately Integrated (11/25/2022)   Social Connection and Isolation Panel [NHANES]    Frequency of Communication with Friends and Family: More than three times a week    Frequency of Social Gatherings with Friends and Family: Never    Attends Religious Services: 1 to 4 times per year    Active Member of [employer] or Organizations: No    Attends Banker Meetings: Never    Marital Status: Living with partner  Intimate Partner Violence: Not At Risk (11/25/2022)   Humiliation, Afraid, Rape, and Kick questionnaire    Fear of Current or Ex-Partner: No    Emotionally Abused: No    Physically  Abused: No    Sexually Abused: No    Lab Results  Component  Value Date   HGBA1C 13.5 (A) 01/12/2023   HGBA1C CANCELED 01/04/2023   HGBA1C 15.0 (A) 06/15/2022   Lab Results  Component Value Date   CHOL 231 (H) 01/04/2023   Lab Results  Component Value Date   HDL 47 01/04/2023   Lab Results  Component Value Date   LDLCALC 147 (H) 01/04/2023   Lab Results  Component Value Date   TRIG 231 (H) 01/04/2023   Lab Results  Component Value Date   CHOLHDL 4.9 01/04/2023   Lab Results  Component Value Date   CREATININE 1.26 01/04/2023   No results found for: GFR Lab Results  Component Value Date   MICROALBUR 0.9 01/04/2023      Component Value Date/Time   NA 134 (L) 01/04/2023 0949   NA 133 (L) 06/15/2022 1436   K 4.7 01/04/2023 0949   CL 97 (L) 01/04/2023 0949   CO2 29 01/04/2023 0949   GLUCOSE 482 (H) 01/04/2023 0949   BUN 15 01/04/2023 0949   BUN 8 06/15/2022 1436   CREATININE 1.26 01/04/2023 0949   CALCIUM  9.6 01/04/2023 0949   PROT 7.7 01/04/2023 0949   PROT 6.7 10/30/2020 1322   ALBUMIN 4.4 10/30/2020 1322   AST 13 01/04/2023 0949   ALT 16 01/04/2023 0949   ALKPHOS 56 10/30/2020 1322   BILITOT 0.5 01/04/2023 0949   BILITOT 0.4 10/30/2020 1322   GFRNONAA >60 06/16/2022 1016   GFRAA >60 09/10/2019 0533      Latest Ref Rng & Units 01/04/2023    9:49 AM 06/16/2022   10:16 AM 06/15/2022    2:36 PM  BMP  Glucose 65 - 99 mg/dL 517  332  450   BUN 7 - 25 mg/dL 15  10  8    Creatinine 0.70 - 1.30 mg/dL 8.73  8.97  8.84   BUN/Creat Ratio 6 - 22 (calc) SEE NOTE:   7   Sodium 135 - 146 mmol/L 134  130  133   Potassium 3.5 - 5.3 mmol/L 4.7  3.8  4.6   Chloride 98 - 110 mmol/L 97  96  94   CO2 20 - 32 mmol/L 29  24  21    Calcium  8.6 - 10.3 mg/dL 9.6  9.6  9.6        Component Value Date/Time   WBC 6.2 06/16/2022 1016   RBC 5.14 06/16/2022 1016   HGB 15.1 06/16/2022 1016   HGB 17.6 10/07/2018 1012   HCT 43.7 06/16/2022 1016   HCT 50.7 10/07/2018 1012    PLT 193 06/16/2022 1016   PLT 249 10/07/2018 1012   MCV 85.0 06/16/2022 1016   MCV 85 10/07/2018 1012   MCH 29.4 06/16/2022 1016   MCHC 34.6 06/16/2022 1016   RDW 12.4 06/16/2022 1016   RDW 12.6 10/07/2018 1012   LYMPHSABS 1.6 01/26/2021 2201   MONOABS 1.1 (H) 01/26/2021 2201   EOSABS 0.1 01/26/2021 2201   BASOSABS 0.0 01/26/2021 2201     Parts of this note may have been dictated using voice recognition software. There may be variances in spelling and vocabulary which are unintentional. Not all errors are proofread. Please notify the dino if any discrepancies are noted or if the meaning of any statement is not clear.

## 2023-01-20 ENCOUNTER — Encounter (HOSPITAL_COMMUNITY): Payer: Medicaid Other | Admitting: Psychiatry

## 2023-01-21 ENCOUNTER — Other Ambulatory Visit (HOSPITAL_BASED_OUTPATIENT_CLINIC_OR_DEPARTMENT_OTHER): Payer: Self-pay

## 2023-01-27 ENCOUNTER — Ambulatory Visit (HOSPITAL_COMMUNITY): Payer: Medicaid Other | Admitting: Mental Health

## 2023-01-28 ENCOUNTER — Other Ambulatory Visit: Payer: Self-pay

## 2023-01-29 ENCOUNTER — Other Ambulatory Visit: Payer: Self-pay

## 2023-02-02 ENCOUNTER — Ambulatory Visit (INDEPENDENT_AMBULATORY_CARE_PROVIDER_SITE_OTHER): Payer: Medicaid Other | Admitting: Mental Health

## 2023-02-02 ENCOUNTER — Encounter (HOSPITAL_COMMUNITY): Payer: Self-pay

## 2023-02-02 DIAGNOSIS — F6381 Intermittent explosive disorder: Secondary | ICD-10-CM

## 2023-02-02 DIAGNOSIS — F411 Generalized anxiety disorder: Secondary | ICD-10-CM

## 2023-02-02 DIAGNOSIS — F4310 Post-traumatic stress disorder, unspecified: Secondary | ICD-10-CM | POA: Insufficient documentation

## 2023-02-02 NOTE — Progress Notes (Signed)
THERAPIST PROGRESS NOTE Virtual Visit via Video Note  I connected with [name] [name] on 02/02/23 at  2:00 PM EST by a video enabled telemedicine application and verified that I am speaking with the correct person using two identifiers.  [facility]   I discussed the limitations of evaluation and management by telemedicine and the availability of in person appointments. The patient expressed understanding and agreed to proceed.  I discussed the assessment and treatment plan with the patient. The patient was provided an opportunity to ask questions and all were answered. The patient agreed with the plan and demonstrated an understanding of the instructions.   The patient was advised to call back or seek an in-person evaluation if the symptoms worsen or if the condition fails to improve as anticipated.  I provided 50 minutes of non-face-to-face time during this encounter.   [name] [name], [facility]   Session Time: 2:03 pm( 50 minutes)  Participation Level: Active  Behavioral Response: CasualAlertAdequate  Type of Therapy: Individual Therapy  Treatment Goals addressed: STG:"My Anger" [name] [name] will increase management of his anger AEB development of x 3 effective coping skills for anger with no more than x 3 anger outburst per week within the next 90 days.   ProgressTowards Goals: Initial  Interventions: Supportive  Summary: [name] [name] is a 53 y.o. male who presents with dx Intermittent Explosive disorder, GAD and PTSD, major depression. Presents for session alert and oriented; mood and affect adequate. Speech clear and coherent at normal rate and tone. Thought process linear; goal directed. Engaged and receptive to interventions. Shares to be present in hotel room with girlfriend [name] [name]; who he states to give permission for her presents during session. Reports increase in stressors with car to have broken down and no longer  having access to transportation. Shares diabetes is unmanaged and having difficulty with his feet and numbness in his hands. Shares to have x 1 week in hotel and sure of where he is going after. Some possible family support. Shares feelings of overwhelm and increased irritability with stressors. Shares anger continues to be main concern with girlfriend reporting for an anger outburst to occur daily with some decrease with medications. [name] [name] shares presence of dreams about prison, easily startled and hypervigilance. Shares with violence in his upbringing normalization of anger and feelings of on edge. Girlfriend notes [name] [name] can be easily provoked into anger and can become aggressive/physical. [name] [name] shares upcoming court date for DV charge next month. Engaged with therapist in education on anger and agrees to start to keep anger journal to increase awareness of how his anger manifest. Receptive of resources provided by therapist and agrees to follow up. Denies current safety concerns and agrees to treatment plan to decrease episodes of anger.   Suicidal/Homicidal: Nowithout intent/plan  Therapist Response: Therapist engaged [name] [name] in tele-therapy session. Completed check in and reviewed bounds of confidentiality, informed consent. Reviewed intake session and current stressors. Assessed for current level of stressors, sxs management and level of functioning. Provided resources to follow up with [facility], [facility] to inquire about care management and switching insurance over to [facility] for access to additional services (CST) that may be more appropriate for his current level of need. Assessed for presence of natural supports. Educated resource for individuals with hx of incarceration. Explored current concerns with anger and explored ways in which anger can manifest in the body and underlying emotions with anger. Assessed for frequency and intensity of anger outburst. Encouraged  to start to utilize anger  journal to increase awareness of triggers and early anger cues to support in increasing ability to cope and reduce outburst. Encouraged to bring at next session. Reviewed session and provided follow up.  Plan: Return again in  x6 weeks.  Diagnosis: Intermittent explosive disorder  GAD (generalized anxiety disorder)  PTSD (post-traumatic stress disorder)  Collaboration of Care: Other None  Patient/Guardian was advised Release of Information must be obtained prior to any record release in order to collaborate their care with an outside provider. Patient/Guardian was advised if they have not already done so to contact the registration department to sign all necessary forms in order for [place] to release information regarding their care.   Consent: Patient/Guardian gives verbal consent for treatment and assignment of benefits for services provided during this visit. Patient/Guardian expressed understanding and agreed to proceed.   Stephan Minister Maunie, [facility] 02/02/2023

## 2023-02-03 ENCOUNTER — Ambulatory Visit (INDEPENDENT_AMBULATORY_CARE_PROVIDER_SITE_OTHER): Payer: Medicaid Other | Admitting: "Endocrinology

## 2023-02-03 ENCOUNTER — Encounter: Payer: Self-pay | Admitting: "Endocrinology

## 2023-02-03 ENCOUNTER — Other Ambulatory Visit: Payer: Self-pay

## 2023-02-03 VITALS — BP 116/68 | HR 88 | Ht 69.0 in | Wt 182.8 lb

## 2023-02-03 DIAGNOSIS — E1165 Type 2 diabetes mellitus with hyperglycemia: Secondary | ICD-10-CM

## 2023-02-03 DIAGNOSIS — Z794 Long term (current) use of insulin: Secondary | ICD-10-CM | POA: Diagnosis not present

## 2023-02-03 DIAGNOSIS — E782 Mixed hyperlipidemia: Secondary | ICD-10-CM | POA: Diagnosis not present

## 2023-02-03 DIAGNOSIS — Z7984 Long term (current) use of oral hypoglycemic drugs: Secondary | ICD-10-CM | POA: Diagnosis not present

## 2023-02-03 MED ORDER — PEN NEEDLES 32G X 4 MM MISC
1.0000 | Freq: Four times a day (QID) | 2 refills | Status: DC
  Filled 2023-02-03: qty 200, 50d supply, fill #0
  Filled 2023-02-19: qty 100, 25d supply, fill #0
  Filled 2023-04-14 – 2023-05-05 (×2): qty 100, 25d supply, fill #1
  Filled 2023-06-29: qty 100, 25d supply, fill #2

## 2023-02-03 MED ORDER — ROSUVASTATIN CALCIUM 10 MG PO TABS
10.0000 mg | ORAL_TABLET | Freq: Every day | ORAL | 3 refills | Status: AC
  Filled 2023-02-03 – 2023-02-19 (×2): qty 90, 90d supply, fill #0
  Filled 2023-06-29 – 2023-09-07 (×2): qty 90, 90d supply, fill #1
  Filled 2024-01-11: qty 90, 90d supply, fill #2

## 2023-02-03 NOTE — Patient Instructions (Addendum)
Will recommend the following: Jardiance 25 mg qam  Lantus 30 units once every noon  Humalog 14 units for break fast, 12 for lunch and 14 for dinner three times a day before each meal + correction scale Humalog Correction scale:  151 - 175: 1 unit 176 - 200: 2 units 201 - 225: 3 units 226 - 250: 4 units 251 - 275: 5 units 276 - 300: 6 units 301 - 325: 7 units 326 - 350: 8 units 351 - 375: 9 units 376 - 400: 10 units   _______________   Goals of DM therapy:  Morning Fasting blood sugar: 80-140  Blood sugar before meals: 80-140 Bed time blood sugar: 100-150  A1C <7%, limited only by hypoglycemia  1.Diabetes medications and their side effects discussed, including hypoglycemia    2. Check blood glucose:  a) Always check blood sugars before driving. Please see below (under hypoglycemia) on how to manage b) Check a minimum of 3 times/day or more as needed when having symptoms of hypoglycemia.   c) Try to check blood glucose before sleeping/in the middle of the night to ensure that it is remaining stable and not dropping less than 100 d) Check blood glucose more often if sick  3. Diet: a) 3 meals per day schedule b: Restrict carbs to 60-70 grams (4 servings) per meal c) Colorful vegetables - 3 servings a day, and low sugar fruit 2 servings/day Plate control method: 1/4 plate protein, 1/4 starch, 1/2 green, yellow, or red vegetables d) Avoid carbohydrate snacks unless hypoglycemic episode, or increased physical activity  4. Regular exercise as tolerated, preferably 3 or more hours a week  5. Hypoglycemia: a)  Do not drive or operate machinery without first testing blood glucose to assure it is over 90 mg%, or if dizzy, lightheaded, not feeling normal, etc, or  if foot or leg is numb or weak. b)  If blood glucose less than 70, take four 5gm Glucose tabs or 15-30 gm Glucose gel.  Repeat every 15 min as needed until blood sugar is >100 mg/dl. If hypoglycemia persists then call  911.   6. Sick day management: a) Check blood glucose more often b) Continue usual therapy if blood sugars are elevated.   7. Contact the doctor immediately if blood glucose is frequently <60 mg/dl, or an episode of severe hypoglycemia occurs (where someone had to give you glucose/  glucagon or if you passed out from a low blood glucose), or if blood glucose is persistently >350 mg/dl, for further management  8. A change in level of physical activity or exercise and a change in diet may also affect your blood sugar. Check blood sugars more often and call if needed.  Instructions: 1. Bring glucose meter, blood glucose records on every visit for review 2. Continue to follow up with primary care physician and other providers for medical care 3. Yearly eye  and foot exam 4. Please get blood work done prior to the next appointment

## 2023-02-03 NOTE — Progress Notes (Signed)
Outpatient Endocrinology Note [name], MD  02/03/23   [name] [name] [birth year] [id number]  Referring Provider: Rema Fendt, NP Primary Care Provider: Rema Fendt, NP Reason for consultation: Subjective   Assessment & Plan  [name] [name] was seen today for follow-up.  Diagnoses and all orders for this visit:  Uncontrolled type 2 diabetes mellitus with hyperglycemia (HCC) -     Ambulatory referral to diabetic education  Long term (current) use of oral hypoglycemic drugs  Long-term insulin use (HCC)  Mixed hypercholesterolemia and hypertriglyceridemia  Other orders -     Insulin Pen Needle (PEN NEEDLES) 32G X 4 MM MISC; 1 Device by Does not apply route in the morning, at noon, in the evening, and at bedtime. -     rosuvastatin (CRESTOR) 10 MG tablet; Take 1 tablet (10 mg total) by mouth daily.   Diabetes Type II complicated by neuropathy, No results found for: "GFR" Hba1c goal less than 7, current Hba1c is 13.5 Lab Results  Component Value Date   HGBA1C 13.5 (A) 01/12/2023   Will recommend the following: Ordered DM education  Jardiance 25 mg qam  Lantus 30 units once every noon  Humalog 14 units for break fast, 12 for lunch and 14 for dinner three times a day before each meal   Humalog Correction scale:  151 - 175: 1 unit 176 - 200: 2 units 201 - 225: 3 units 226 - 250: 4 units 251 - 275: 5 units 276 - 300: 6 units 301 - 325: 7 units 326 - 350: 8 units 351 - 375: 9 units 376 - 400: 10 units   No known contraindications/side effects to any of above medications Has libre 3 sensor   -Last LD and Tg are as follows: Lab Results  Component Value Date   LDLCALC 147 (H) 01/04/2023    Lab Results  Component Value Date   TRIG 231 (H) 01/04/2023   -not on statin  -02/03/23: start rosuvastatin 10 mg every day  -Follow low fat diet and exercise   -Blood pressure goal <140/90 - Microalbumin/creatinine goal is < 30 -Last MA/Cr is as follows: Lab  Results  Component Value Date   MICROALBUR 0.9 01/04/2023   -not on ACE/ARB  -diet changes including salt restriction -limit eating outside -counseled BP targets per standards of diabetes care -uncontrolled blood pressure can lead to retinopathy, nephropathy and cardiovascular and atherosclerotic heart disease  Reviewed and counseled on: -A1C target -Blood sugar targets -Complications of uncontrolled diabetes  -Checking blood sugar before meals and bedtime and bring log next visit -All medications with mechanism of action and side effects -Hypoglycemia management: rule of 15's, Glucagon Emergency Kit and medical alert ID -low-carb low-fat plate-method diet -At least 20 minutes of physical activity per day -Annual dilated retinal eye exam and foot exam -compliance and follow up needs -follow up as scheduled or earlier if problem gets worse  Call if blood sugar is less than 70 or consistently above 250    Take a 15 gm snack of carbohydrate at bedtime before you go to sleep if your blood sugar is less than 100.    If you are going to fast after midnight for a test or procedure, ask your physician for instructions on how to reduce/decrease your insulin dose.    Call if blood sugar is less than 70 or consistently above 250  -Treating a low sugar by rule of 15  (15 gms of sugar every 15 min until sugar is  more than 70) If you feel your sugar is low, test your sugar to be sure If your sugar is low (less than 70), then take 15 grams of a fast acting Carbohydrate (3-4 glucose tablets or glucose gel or 4 ounces of juice or regular soda) Recheck your sugar 15 min after treating low to make sure it is more than 70 If sugar is still less than 70, treat again with 15 grams of carbohydrate          Don't drive the hour of hypoglycemia  If unconscious/unable to eat or drink by mouth, use glucagon injection or nasal spray baqsimi and call 911. Can repeat again in 15 min if still  unconscious.  Return in about 3 weeks (around 02/24/2023).   I have reviewed current medications, nurse's notes, allergies, vital signs, past medical and surgical history, family medical history, and social history for this encounter. Counseled patient on symptoms, examination findings, lab findings, imaging results, treatment decisions and monitoring and prognosis. The patient understood the recommendations and agrees with the treatment plan. All questions regarding treatment plan were fully answered.  [name] [place]opit, MD  02/03/23   History of Present Illness [name] [name] is a 53 y.o. year old male who presents for evaluation of Type II diabetes mellitus.  [name] [name] was first diagnosed in 2020.   Diabetes education +  Home diabetes regimen: Humalog 12 units 15 min three times a day Jardiance 10 mg qam  Lantus 25 units once in daytime    COMPLICATIONS -  MI/Stroke -  retinopathy +  neuropathy -  nephropathy  SYMPTOMS REVIEWED + Polyuria - Weight loss - Blurred vision  BLOOD SUGAR DATA  CGM interpretation: At today's visit, we reviewed her CGM downloads. The full report is scanned in the media. Reviewing the CGM trends, BG are elevated most of the time.  Physical Exam  BP 116/68   Pulse 88   Ht 5\' 9"  (1.753 m)   Wt 182 lb 12.8 oz (82.9 kg)   SpO2 98%   BMI 26.99 kg/m    Constitutional: well developed, well nourished Head: normocephalic, atraumatic Eyes: sclera anicteric, no redness Neck: supple Lungs: normal respiratory effort Neurology: alert and oriented Skin: dry, no appreciable rashes Musculoskeletal: no appreciable defects Psychiatric: normal mood and affect Diabetic Foot Exam - Simple   No data filed    Current Medications Patient's Medications  New Prescriptions   INSULIN PEN NEEDLE (PEN NEEDLES) 32G X 4 MM MISC    1 Device by Does not apply route in the morning, at noon, in the evening, and at bedtime.   ROSUVASTATIN (CRESTOR) 10 MG TABLET     Take 1 tablet (10 mg total) by mouth daily.  Previous Medications   ACCU-CHEK SOFTCLIX LANCETS LANCETS    Testing 4 (four) times daily -  before meals and at bedtime. Use as directed.   BLOOD GLUCOSE MONITORING SUPPL (ACCU-CHEK GUIDE) W/DEVICE KIT    1 each by Other route 4 (four) times daily -  before meals and at bedtime. Check blood sugar once daily in the morning before taking Lantus   CLONIDINE (CATAPRES) 0.1 MG TABLET    Take 1 tablet (0.1 mg total) by mouth in the morning AND 2 tablets (0.2 mg total) at bedtime.   CONTINUOUS GLUCOSE SENSOR (FREESTYLE LIBRE 3 SENSOR) MISC    Place 1 sensor on the skin every 14 days. Use to check glucose continuously   EMPAGLIFLOZIN (JARDIANCE) 25 MG TABS TABLET  Take 1 tablet (25 mg total) by mouth daily before breakfast.   FLUOXETINE (PROZAC) 20 MG CAPSULE    Take 3 capsules (60 mg total) by mouth daily.   GABAPENTIN (NEURONTIN) 100 MG CAPSULE    Take 2 capsules (200 mg total) by mouth 3 (three) times daily.   GLUCOSE BLOOD (ACCU-CHEK GUIDE) TEST STRIP    Testing 4 (four) times daily -  before meals and at bedtime. Use as instructed   HYDROXYZINE (VISTARIL) 25 MG CAPSULE    Take 1 capsule (25 mg total) by mouth daily as needed.   INSULIN GLARGINE (LANTUS SOLOSTAR) 100 UNIT/ML SOLOSTAR PEN    Inject 25 Units into the skin at bedtime.   INSULIN LISPRO (HUMALOG KWIKPEN) 100 UNIT/ML KWIKPEN    Inject 5-15 Units into the skin 3 (three) times daily.   INSULIN PEN NEEDLE (TECHLITE PEN NEEDLES) 32G X 4 MM MISC    Use as directed daily.   TRAMADOL (ULTRAM) 50 MG TABLET    Take 50 mg by mouth every 6 (six) hours as needed.  Modified Medications   No medications on file  Discontinued Medications   No medications on file    Allergies No Known Allergies  Past Medical History Past Medical History:  Diagnosis Date   Diabetes mellitus without complication (HCC)    Diabetes mellitus, type II (HCC)    Hyperlipidemia     Past Surgical History History reviewed.  No pertinent surgical history.  Family History family history includes Diabetes in his paternal grandfather and paternal grandmother; Stroke in his paternal grandfather.  Social History Social History   Socioeconomic History   Marital status: Single    Spouse name: Not on file   Number of children: Not on file   Years of education: Not on file   Highest education level: GED or equivalent  Occupational History   Not on file  Tobacco Use   Smoking status: Never    Passive exposure: Current   Smokeless tobacco: Current  Vaping Use   Vaping status: Never Used  Substance and Sexual Activity   Alcohol use: Never   Drug use: Never   Sexual activity: Yes  Other Topics Concern   Not on file  Social History Narrative   Not on file   Social Drivers of Health   Financial Resource Strain: High Risk (06/11/2022)   Overall Financial Resource Strain (CARDIA)    Difficulty of Paying Living Expenses: Very hard  Food Insecurity: Food Insecurity Present (06/11/2022)   Hunger Vital Sign    Worried About Running Out of Food in the Last Year: Often true    Ran Out of Food in the Last Year: Often true  Transportation Needs: Unmet Transportation Needs (06/11/2022)   PRAPARE - Administrator, Civil Service (Medical): Yes    Lack of Transportation (Non-Medical): Yes  Physical Activity: Inactive (11/25/2022)   Exercise Vital Sign    Days of Exercise per Week: 0 days    Minutes of Exercise per Session: 0 min  Stress: Stress Concern Present (11/25/2022)   [employer] of Occupational Health - Occupational Stress Questionnaire    Feeling of Stress : Very much  Social Connections: Moderately Integrated (11/25/2022)   Social Connection and Isolation Panel [NHANES]    Frequency of Communication with Friends and Family: More than three times a week    Frequency of Social Gatherings with Friends and Family: Never    Attends Religious Services: 1 to 4 times per year  Active Member  of Clubs or Organizations: No    Attends Banker Meetings: Never    Marital Status: Living with partner  Intimate Partner Violence: Not At Risk (11/25/2022)   Humiliation, Afraid, Rape, and Kick questionnaire    [name] of Current or Ex-Partner: No    Emotionally Abused: No    Physically Abused: No    Sexually Abused: No    Lab Results  Component Value Date   HGBA1C 13.5 (A) 01/12/2023   HGBA1C CANCELED 01/04/2023   HGBA1C 15.0 (A) 06/15/2022   Lab Results  Component Value Date   CHOL 231 (H) 01/04/2023   Lab Results  Component Value Date   HDL 47 01/04/2023   Lab Results  Component Value Date   LDLCALC 147 (H) 01/04/2023   Lab Results  Component Value Date   TRIG 231 (H) 01/04/2023   Lab Results  Component Value Date   CHOLHDL 4.9 01/04/2023   Lab Results  Component Value Date   CREATININE 1.26 01/04/2023   No results found for: "GFR" Lab Results  Component Value Date   MICROALBUR 0.9 01/04/2023      Component Value Date/Time   NA 134 (L) 01/04/2023 0949   NA 133 (L) 06/15/2022 1436   K 4.7 01/04/2023 0949   CL 97 (L) 01/04/2023 0949   CO2 29 01/04/2023 0949   GLUCOSE 482 (H) 01/04/2023 0949   BUN 15 01/04/2023 0949   BUN 8 06/15/2022 1436   CREATININE 1.26 01/04/2023 0949   CALCIUM 9.6 01/04/2023 0949   PROT 7.7 01/04/2023 0949   PROT 6.7 10/30/2020 1322   ALBUMIN 4.4 10/30/2020 1322   AST 13 01/04/2023 0949   ALT 16 01/04/2023 0949   ALKPHOS 56 10/30/2020 1322   BILITOT 0.5 01/04/2023 0949   BILITOT 0.4 10/30/2020 1322   GFRNONAA >60 06/16/2022 1016   GFRAA >60 09/10/2019 0533      Latest Ref Rng & Units 01/04/2023    9:49 AM 06/16/2022   10:16 AM 06/15/2022    2:36 PM  BMP  Glucose 65 - 99 mg/dL 528  413  244   BUN 7 - 25 mg/dL 15  10  8    Creatinine 0.70 - 1.30 mg/dL 0.10  2.72  5.36   BUN/Creat Ratio 6 - 22 (calc) SEE NOTE:   7   Sodium 135 - 146 mmol/L 134  130  133   Potassium 3.5 - 5.3 mmol/L 4.7  3.8  4.6   Chloride 98 -  110 mmol/L 97  96  94   CO2 20 - 32 mmol/L 29  24  21    Calcium 8.6 - 10.3 mg/dL 9.6  9.6  9.6        Component Value Date/Time   WBC 6.2 06/16/2022 1016   RBC 5.14 06/16/2022 1016   HGB 15.1 06/16/2022 1016   HGB 17.6 10/07/2018 1012   HCT 43.7 06/16/2022 1016   HCT 50.7 10/07/2018 1012   PLT 193 06/16/2022 1016   PLT 249 10/07/2018 1012   MCV 85.0 06/16/2022 1016   MCV 85 10/07/2018 1012   MCH 29.4 06/16/2022 1016   MCHC 34.6 06/16/2022 1016   RDW 12.4 06/16/2022 1016   RDW 12.6 10/07/2018 1012   LYMPHSABS 1.6 01/26/2021 2201   MONOABS 1.1 (H) 01/26/2021 2201   EOSABS 0.1 01/26/2021 2201   BASOSABS 0.0 01/26/2021 2201     Parts of this note may have been dictated using voice recognition software. There may  be variances in spelling and vocabulary which are unintentional. Not all errors are proofread. Please notify the Thereasa Parkin if any discrepancies are noted or if the meaning of any statement is not clear.

## 2023-02-04 ENCOUNTER — Encounter: Payer: Self-pay | Admitting: "Endocrinology

## 2023-02-11 ENCOUNTER — Telehealth: Payer: Self-pay | Admitting: Family

## 2023-02-11 NOTE — Telephone Encounter (Signed)
Called patient and explained to him that in order to switch from [employer] to [facility] he would need to contact his caseworker and any medical information they need with be coming from his behavioral health provider.

## 2023-02-11 NOTE — Telephone Encounter (Signed)
Copied from CRM [id number]. Topic: General - Other >> Feb 11, 2023  3:33 PM Carlatta H wrote: Reason for CRM: Patient would  like form filled out for him to get medicaid and he was advised that he office could fill out the form and send back for the patient//Please send a message via my chart about form//

## 2023-02-15 ENCOUNTER — Other Ambulatory Visit: Payer: Self-pay

## 2023-02-19 ENCOUNTER — Other Ambulatory Visit (HOSPITAL_COMMUNITY): Payer: Self-pay

## 2023-02-19 ENCOUNTER — Other Ambulatory Visit: Payer: Self-pay

## 2023-02-22 ENCOUNTER — Other Ambulatory Visit: Payer: Self-pay

## 2023-02-23 ENCOUNTER — Other Ambulatory Visit: Payer: Self-pay

## 2023-02-24 ENCOUNTER — Ambulatory Visit: Payer: Medicaid Other | Admitting: "Endocrinology

## 2023-03-03 ENCOUNTER — Other Ambulatory Visit: Payer: Self-pay

## 2023-03-05 ENCOUNTER — Other Ambulatory Visit: Payer: Self-pay

## 2023-03-05 ENCOUNTER — Ambulatory Visit: Payer: Medicaid Other | Admitting: Dietician

## 2023-03-24 ENCOUNTER — Ambulatory Visit: Payer: Medicaid Other | Admitting: Dietician

## 2023-03-25 ENCOUNTER — Ambulatory Visit: Admitting: "Endocrinology

## 2023-03-30 ENCOUNTER — Ambulatory Visit (INDEPENDENT_AMBULATORY_CARE_PROVIDER_SITE_OTHER): Payer: MEDICAID | Admitting: Mental Health

## 2023-03-30 DIAGNOSIS — F4310 Post-traumatic stress disorder, unspecified: Secondary | ICD-10-CM

## 2023-03-30 DIAGNOSIS — F6381 Intermittent explosive disorder: Secondary | ICD-10-CM

## 2023-03-30 DIAGNOSIS — F411 Generalized anxiety disorder: Secondary | ICD-10-CM

## 2023-03-30 NOTE — Progress Notes (Unsigned)
 THERAPIST PROGRESS NOTE Virtual Visit via Video Note  I connected with [name] [name] on 03/30/23 at  9:00 AM EDT by a video enabled telemedicine application and verified that I am speaking with the correct person using two identifiers.  Location: Patient: Friend's home- [place].  Provider: office   I discussed the limitations of evaluation and management by telemedicine and the availability of in person appointments. The patient expressed understanding and agreed to proceed.  I discussed the assessment and treatment plan with the patient. The patient was provided an opportunity to ask questions and all were answered. The patient agreed with the plan and demonstrated an understanding of the instructions.   The patient was advised to call back or seek an in-person evaluation if the symptoms worsen or if the condition fails to improve as anticipated.  I provided 48 minutes of non-face-to-face time during this encounter.   [name] [name], [facility]   Session Time: 9:02 am ( 48 minutes)  Participation Level: Active  Behavioral Response: CasualAlertDysphoric  Type of Therapy: Individual Therapy  Treatment Goals addressed: STG:"My Anger" Greogory will increase management of his anger AEB development of x 3 effective coping skills for anger with no more than x 3 anger outburst per week within the next 90 days.   ProgressTowards Goals: Progressing  Interventions: Supportive, Motivational Interviewing and CBT  Summary:  [name] [name] is a 53 y.o. male who presents with dx Intermittent Explosive disorder, GAD and PTSD, major depression. Presents for session alert and oriented; mood and affect adequate. Speech clear and coherent at normal rate and tone. Thought process linear; goal directed. Engaged and receptive to interventions. Shares ongoing stressors with homelessness and lack of income. Shars currently resideing with a friend. Notes for moods to be up and down with  ongoing daily explosive episodes with others. Shares to be easily triggered and will lash out. Notes a trigger to be told things over and over by others and shares this occurs with partner. Shares can bottle up emotions in which he can decline to share in which partner will insist on talking. "When I am done with something I am done. I don't want to talk." Shares difficuly in compromising. Notes attempts to walk away when getting upset however partner may not allows allow for this. Explores thoughts of current relationship and dynamics; with discord interaction with partner during session. Notes to have presented to psychotherapeutic alternatives and desire to engage with ACTT. Receptive of if abel to do so need to cease OPT services and additional need to transfer medicaid to [employer]. Denies safety concerns. Minimal progress with goals.   Suicidal/Homicidal: Nowithout intent/plan  Therapist Response:  Therapist engaged Karla in tele-therapy session. Completed check in and current stressors. Assessed for current level of stressors, sxs management and level of functioning. Educated on need to transfer Medicaid to [facility] for access to additional services. Explored current ability to cope with anger and presence of explosive outburst. Engaged in exploring anger triggers and ability to self-sooth and regulate emotions. Explored ability to utilize coping skills and explored barriers in doing so. Engaged in processing ability to identify anger cues and attempting use of skills. Educated on ACTT services and encouraged to follow up if accepted provided high degree of psychosocial stressors at this time. Reviewed session and provided follow up.   Plan: Return again in  x 8 weeks.  Diagnosis: Intermittent explosive disorder  PTSD (post-traumatic stress disorder)  GAD (generalized anxiety disorder)  Collaboration of  Care: Other None  Patient/Guardian was advised Release of Information must be  obtained prior to any record release in order to collaborate their care with an outside provider. Patient/Guardian was advised if they have not already done so to contact the registration department to sign all necessary forms in order for [place] to release information regarding their care.   Consent: Patient/Guardian gives verbal consent for treatment and assignment of benefits for services provided during this visit. Patient/Guardian expressed understanding and agreed to proceed.   Stephan Minister [place], [facility] 03/30/2023

## 2023-04-02 ENCOUNTER — Encounter: Payer: MEDICAID | Admitting: Family

## 2023-04-02 NOTE — Progress Notes (Signed)
 Erroneous encounter-disregard

## 2023-04-14 ENCOUNTER — Other Ambulatory Visit: Payer: Self-pay

## 2023-04-15 ENCOUNTER — Telehealth: Payer: Self-pay

## 2023-04-15 ENCOUNTER — Other Ambulatory Visit: Payer: Self-pay

## 2023-04-15 NOTE — Telephone Encounter (Signed)
 Pharmacy Patient Advocate Encounter   Received notification from CoverMyMeds that prior authorization for FREESTYLE LIBRE 3 SENSOR is required/requested.   Insurance verification completed.   The patient is insured through [employer] .   Per test claim: PA required; PA submitted to above mentioned insurance via CoverMyMeds Key/confirmation #/EOC [id number] Status is pending

## 2023-04-16 ENCOUNTER — Other Ambulatory Visit: Payer: Self-pay

## 2023-04-19 ENCOUNTER — Other Ambulatory Visit: Payer: Self-pay

## 2023-04-20 ENCOUNTER — Other Ambulatory Visit: Payer: Self-pay

## 2023-04-26 ENCOUNTER — Other Ambulatory Visit: Payer: Self-pay

## 2023-04-28 ENCOUNTER — Other Ambulatory Visit: Payer: Self-pay

## 2023-05-05 ENCOUNTER — Other Ambulatory Visit: Payer: Self-pay

## 2023-05-07 ENCOUNTER — Other Ambulatory Visit: Payer: Self-pay

## 2023-05-19 ENCOUNTER — Other Ambulatory Visit: Payer: Self-pay

## 2023-05-19 ENCOUNTER — Encounter: Payer: Self-pay | Admitting: "Endocrinology

## 2023-05-19 ENCOUNTER — Ambulatory Visit (INDEPENDENT_AMBULATORY_CARE_PROVIDER_SITE_OTHER): Payer: MEDICAID | Admitting: "Endocrinology

## 2023-05-19 VITALS — BP 130/80 | HR 68 | Ht 69.0 in | Wt 194.0 lb

## 2023-05-19 DIAGNOSIS — Z794 Long term (current) use of insulin: Secondary | ICD-10-CM | POA: Diagnosis not present

## 2023-05-19 DIAGNOSIS — E782 Mixed hyperlipidemia: Secondary | ICD-10-CM

## 2023-05-19 DIAGNOSIS — Z7984 Long term (current) use of oral hypoglycemic drugs: Secondary | ICD-10-CM | POA: Diagnosis not present

## 2023-05-19 DIAGNOSIS — E1165 Type 2 diabetes mellitus with hyperglycemia: Secondary | ICD-10-CM

## 2023-05-19 LAB — POCT GLYCOSYLATED HEMOGLOBIN (HGB A1C): Hemoglobin A1C: 9.2 % — AB (ref 4.0–5.6)

## 2023-05-19 MED ORDER — LANTUS SOLOSTAR 100 UNIT/ML ~~LOC~~ SOPN
28.0000 [IU] | PEN_INJECTOR | Freq: Every day | SUBCUTANEOUS | 2 refills | Status: DC
  Filled 2023-05-19 – 2023-07-21 (×3): qty 27, 90d supply, fill #0
  Filled 2023-09-15 – 2023-09-27 (×2): qty 27, 90d supply, fill #1
  Filled 2023-11-29: qty 27, 90d supply, fill #2

## 2023-05-19 MED ORDER — INSULIN LISPRO (1 UNIT DIAL) 100 UNIT/ML (KWIKPEN)
14.0000 [IU] | PEN_INJECTOR | Freq: Three times a day (TID) | SUBCUTANEOUS | 0 refills | Status: DC
  Filled 2023-05-19 – 2023-06-29 (×2): qty 54, 90d supply, fill #0
  Filled 2023-07-21: qty 15, 25d supply, fill #0
  Filled 2023-09-07: qty 15, 25d supply, fill #1
  Filled 2023-11-25 (×2): qty 15, 25d supply, fill #2

## 2023-05-19 NOTE — Progress Notes (Signed)
 Outpatient Endocrinology Note [name] Newcomer, MD  05/19/23   [name] [name] 24-Aug-1970 [id number]  Referring Provider: Senaida Dama, NP Primary Care Provider: Senaida Dama, NP Reason for consultation: Subjective   Assessment & Plan  Diagnoses and all orders for this visit:  Uncontrolled type 2 diabetes mellitus with hyperglycemia (HCC) -     insulin  glargine (LANTUS  SOLOSTAR) 100 UNIT/ML Solostar Pen; Inject 28-30 Units into the skin at bedtime. -     POCT glycosylated hemoglobin (Hb A1C)  Long term (current) use of oral hypoglycemic drugs  Long-term insulin  use (HCC)  Mixed hypercholesterolemia and hypertriglyceridemia  Other orders -     insulin  lispro (HUMALOG  KWIKPEN) 100 UNIT/ML KwikPen; Inject 14-20 Units into the skin 3 (three) times daily.    Diabetes Type II complicated by neuropathy, No results found for: "GFR" Hba1c goal less than 7, current Hba1c is 13.5 Lab Results  Component Value Date   HGBA1C 9.2 (A) 05/19/2023   Ordered DM education  Jardiance  25 mg qam  Lantus  28-30 units once a day Humalog  14 units for meals, three times a day 15 min before each meal  Humalog  Correction scale:  151 - 175: 1 unit 176 - 200: 2 units 201 - 225: 3 units 226 - 250: 4 units 251 - 275: 5 units 276 - 300: 6 units 301 - 325: 7 units 326 - 350: 8 units 351 - 375: 9 units 376 - 400: 10 units   No known contraindications/side effects to any of above medications Has libre 3 sensor   -Last LD and Tg are as follows: Lab Results  Component Value Date   LDLCALC 147 (H) 01/04/2023    Lab Results  Component Value Date   TRIG 231 (H) 01/04/2023   -02/03/23: started rosuvastatin  10 mg every day  -Follow low fat diet and exercise   -Blood pressure goal <140/90 - Microalbumin/creatinine goal is < 30 -Last MA/Cr is as follows: Lab Results  Component Value Date   MICROALBUR 0.9 01/04/2023   -not on ACE/ARB  -diet changes including salt  restriction -limit eating outside -counseled BP targets per standards of diabetes care -uncontrolled blood pressure can lead to retinopathy, nephropathy and cardiovascular and atherosclerotic heart disease  Reviewed and counseled on: -A1C target -Blood sugar targets -Complications of uncontrolled diabetes  -Checking blood sugar before meals and bedtime and bring log next visit -All medications with mechanism of action and side effects -Hypoglycemia management: rule of 15's, Glucagon Emergency Kit and medical alert ID -low-carb low-fat plate-method diet -At least 20 minutes of physical activity per day -Annual dilated retinal eye exam and foot exam -compliance and follow up needs -follow up as scheduled or earlier if problem gets worse  Call if blood sugar is less than 70 or consistently above 250    Take a 15 gm snack of carbohydrate at bedtime before you go to sleep if your blood sugar is less than 100.    If you are going to fast after midnight for a test or procedure, ask your physician for instructions on how to reduce/decrease your insulin  dose.    Call if blood sugar is less than 70 or consistently above 250  -Treating a low sugar by rule of 15  (15 gms of sugar every 15 min until sugar is more than 70) If you feel your sugar is low, test your sugar to be sure If your sugar is low (less than 70), then take 15 grams of a fast  acting Carbohydrate (3-4 glucose tablets or glucose gel or 4 ounces of juice or regular soda) Recheck your sugar 15 min after treating low to make sure it is more than 70 If sugar is still less than 70, treat again with 15 grams of carbohydrate          Don't drive the hour of hypoglycemia  If unconscious/unable to eat or drink by mouth, use glucagon injection or nasal spray baqsimi and call 911. Can repeat again in 15 min if still unconscious.  Return in about 6 weeks (around 06/30/2023).   I have reviewed current medications, nurse's notes, allergies,  vital signs, past medical and surgical history, family medical history, and social history for this encounter. Counseled patient on symptoms, examination findings, lab findings, imaging results, treatment decisions and monitoring and prognosis. The patient understood the recommendations and agrees with the treatment plan. All questions regarding treatment plan were fully answered.  [name] Newcomer, MD  05/19/23   History of Present Illness [name] [name] is a 53 y.o. year old male who presents for evaluation of Type II diabetes mellitus.  Djibril Feland was first diagnosed in 2020.   Diabetes education +  Home diabetes regimen: Humalog  14 units 15 min three times a day, not using correction scale  Jardiance  10 mg qam  Lantus  25 units once in daytime, did not increase it  COMPLICATIONS -  MI/Stroke -  retinopathy +  neuropathy -  nephropathy  BLOOD SUGAR DATA  CGM interpretation: At today's visit, we reviewed her CGM downloads. The full report is scanned in the media. Reviewing the CGM trends, BG are elevated most of the time.  Physical Exam  BP 130/80   Pulse 68   Ht 5\' 9"  (1.753 m)   Wt 194 lb (88 kg)   SpO2 98%   BMI 28.65 kg/m    Constitutional: well developed, well nourished Head: normocephalic, atraumatic Eyes: sclera anicteric, no redness Neck: supple Lungs: normal respiratory effort Neurology: alert and oriented Skin: dry, no appreciable rashes Musculoskeletal: no appreciable defects Psychiatric: normal mood and affect Diabetic Foot Exam - Simple   No data filed    Current Medications Patient's Medications  New Prescriptions   No medications on file  Previous Medications   ACCU-CHEK SOFTCLIX LANCETS LANCETS    Testing 4 (four) times daily -  before meals and at bedtime. Use as directed.   BLOOD GLUCOSE MONITORING SUPPL (ACCU-CHEK GUIDE) W/DEVICE KIT    1 each by Other route 4 (four) times daily -  before meals and at bedtime. Check blood sugar once daily in the  morning before taking Lantus    CLONIDINE  (CATAPRES ) 0.1 MG TABLET    Take 1 tablet (0.1 mg total) by mouth in the morning AND 2 tablets (0.2 mg total) at bedtime.   CONTINUOUS GLUCOSE SENSOR (FREESTYLE LIBRE 3 SENSOR) MISC    Place 1 sensor on the skin every 14 days. Use to check glucose continuously   EMPAGLIFLOZIN  (JARDIANCE ) 25 MG TABS TABLET    Take 1 tablet (25 mg total) by mouth daily before breakfast.   FLUOXETINE  (PROZAC ) 20 MG CAPSULE    Take 3 capsules (60 mg total) by mouth daily.   GABAPENTIN  (NEURONTIN ) 100 MG CAPSULE    Take 2 capsules (200 mg total) by mouth 3 (three) times daily.   GLUCOSE BLOOD (ACCU-CHEK GUIDE) TEST STRIP    Testing 4 (four) times daily -  before meals and at bedtime. Use as instructed   HYDROXYZINE  (VISTARIL ) 25  MG CAPSULE    Take 1 capsule (25 mg total) by mouth daily as needed.   INSULIN  PEN NEEDLE (PEN NEEDLES) 32G X 4 MM MISC    USE AS DIRECTED in the morning, at noon, in the evening, and at bedtime.   INSULIN  PEN NEEDLE (TECHLITE PEN NEEDLES) 32G X 4 MM MISC    Use as directed daily.   ROSUVASTATIN  (CRESTOR ) 10 MG TABLET    Take 1 tablet (10 mg total) by mouth daily.   TRAMADOL (ULTRAM) 50 MG TABLET    Take 50 mg by mouth every 6 (six) hours as needed.  Modified Medications   Modified Medication Previous Medication   INSULIN  GLARGINE (LANTUS  SOLOSTAR) 100 UNIT/ML SOLOSTAR PEN insulin  glargine (LANTUS  SOLOSTAR) 100 UNIT/ML Solostar Pen      Inject 28-30 Units into the skin at bedtime.    Inject 25 Units into the skin at bedtime.   INSULIN  LISPRO (HUMALOG  KWIKPEN) 100 UNIT/ML KWIKPEN insulin  lispro (HUMALOG  KWIKPEN) 100 UNIT/ML KwikPen      Inject 14-20 Units into the skin 3 (three) times daily.    Inject 5-15 Units into the skin 3 (three) times daily.  Discontinued Medications   No medications on file    Allergies No Known Allergies  Past Medical History Past Medical History:  Diagnosis Date   Diabetes mellitus without complication (HCC)     Diabetes mellitus, type II (HCC)    Hyperlipidemia     Past Surgical History History reviewed. No pertinent surgical history.  Family History family history includes Diabetes in his paternal grandfather and paternal grandmother; Stroke in his paternal grandfather.  Social History Social History   Socioeconomic History   Marital status: Single    Spouse name: Not on file   Number of children: Not on file   Years of education: Not on file   Highest education level: GED or equivalent  Occupational History   Not on file  Tobacco Use   Smoking status: Never    Passive exposure: Current   Smokeless tobacco: Current  Vaping Use   Vaping status: Never Used  Substance and Sexual Activity   Alcohol use: Never   Drug use: Never   Sexual activity: Yes  Other Topics Concern   Not on file  Social History Narrative   Not on file   Social Drivers of Health   Financial Resource Strain: High Risk (06/11/2022)   Overall Financial Resource Strain (CARDIA)    Difficulty of Paying Living Expenses: Very hard  Food Insecurity: Food Insecurity Present (06/11/2022)   Hunger Vital Sign    Worried About Running Out of Food in the Last Year: Often true    Ran Out of Food in the Last Year: Often true  Transportation Needs: Unmet Transportation Needs (06/11/2022)   PRAPARE - Administrator, Civil Service (Medical): Yes    Lack of Transportation (Non-Medical): Yes  Physical Activity: Inactive (11/25/2022)   Exercise Vital Sign    Days of Exercise per Week: 0 days    Minutes of Exercise per Session: 0 min  Stress: Stress Concern Present (11/25/2022)   [employer] of Occupational Health - Occupational Stress Questionnaire    Feeling of Stress : Very much  Social Connections: Moderately Integrated (11/25/2022)   Social Connection and Isolation Panel [NHANES]    Frequency of Communication with Friends and Family: More than three times a week    Frequency of Social Gatherings with  Friends and Family: Never    Attends  Religious Services: 1 to 4 times per year    Active Member of Clubs or Organizations: No    Attends Banker Meetings: Never    Marital Status: Living with partner  Intimate Partner Violence: Not At Risk (11/25/2022)   Humiliation, Afraid, Rape, and Kick questionnaire    Fear of Current or Ex-Partner: No    Emotionally Abused: No    Physically Abused: No    Sexually Abused: No    Lab Results  Component Value Date   HGBA1C 9.2 (A) 05/19/2023   HGBA1C 13.5 (A) 01/12/2023   HGBA1C CANCELED 01/04/2023   Lab Results  Component Value Date   CHOL 231 (H) 01/04/2023   Lab Results  Component Value Date   HDL 47 01/04/2023   Lab Results  Component Value Date   LDLCALC 147 (H) 01/04/2023   Lab Results  Component Value Date   TRIG 231 (H) 01/04/2023   Lab Results  Component Value Date   CHOLHDL 4.9 01/04/2023   Lab Results  Component Value Date   CREATININE 1.26 01/04/2023   No results found for: "GFR" Lab Results  Component Value Date   MICROALBUR 0.9 01/04/2023      Component Value Date/Time   NA 134 (L) 01/04/2023 0949   NA 133 (L) 06/15/2022 1436   K 4.7 01/04/2023 0949   CL 97 (L) 01/04/2023 0949   CO2 29 01/04/2023 0949   GLUCOSE 482 (H) 01/04/2023 0949   BUN 15 01/04/2023 0949   BUN 8 06/15/2022 1436   CREATININE 1.26 01/04/2023 0949   CALCIUM  9.6 01/04/2023 0949   PROT 7.7 01/04/2023 0949   PROT 6.7 10/30/2020 1322   ALBUMIN 4.4 10/30/2020 1322   AST 13 01/04/2023 0949   ALT 16 01/04/2023 0949   ALKPHOS 56 10/30/2020 1322   BILITOT 0.5 01/04/2023 0949   BILITOT 0.4 10/30/2020 1322   GFRNONAA >60 06/16/2022 1016   GFRAA >60 09/10/2019 0533      Latest Ref Rng & Units 01/04/2023    9:49 AM 06/16/2022   10:16 AM 06/15/2022    2:36 PM  BMP  Glucose 65 - 99 mg/dL 578  469  629   BUN 7 - 25 mg/dL 15  10  8    Creatinine 0.70 - 1.30 mg/dL 5.28  4.13  2.44   BUN/Creat Ratio 6 - 22 (calc) SEE NOTE:   7    Sodium 135 - 146 mmol/L 134  130  133   Potassium 3.5 - 5.3 mmol/L 4.7  3.8  4.6   Chloride 98 - 110 mmol/L 97  96  94   CO2 20 - 32 mmol/L 29  24  21    Calcium  8.6 - 10.3 mg/dL 9.6  9.6  9.6        Component Value Date/Time   WBC 6.2 06/16/2022 1016   RBC 5.14 06/16/2022 1016   HGB 15.1 06/16/2022 1016   HGB 17.6 10/07/2018 1012   HCT 43.7 06/16/2022 1016   HCT 50.7 10/07/2018 1012   PLT 193 06/16/2022 1016   PLT 249 10/07/2018 1012   MCV 85.0 06/16/2022 1016   MCV 85 10/07/2018 1012   MCH 29.4 06/16/2022 1016   MCHC 34.6 06/16/2022 1016   RDW 12.4 06/16/2022 1016   RDW 12.6 10/07/2018 1012   LYMPHSABS 1.6 01/26/2021 2201   MONOABS 1.1 (H) 01/26/2021 2201   EOSABS 0.1 01/26/2021 2201   BASOSABS 0.0 01/26/2021 2201     Parts of this note  may have been dictated using voice recognition software. There may be variances in spelling and vocabulary which are unintentional. Not all errors are proofread. Please notify the [facility] if any discrepancies are noted or if the meaning of any statement is not clear.

## 2023-05-19 NOTE — Patient Instructions (Addendum)
 Jardiance  25 mg qam  Lantus  28-30 units once every noon  Humalog  14 units with meals three times a day 15 min before each meal   Humalog  Correction scale:  151 - 175: 1 unit 176 - 200: 2 units 201 - 225: 3 units 226 - 250: 4 units 251 - 275: 5 units 276 - 300: 6 units 301 - 325: 7 units 326 - 350: 8 units 351 - 375: 9 units 376 - 400: 10 units

## 2023-06-03 NOTE — Telephone Encounter (Signed)
Patient scheduled for appointment with provider.

## 2023-06-10 ENCOUNTER — Ambulatory Visit: Payer: MEDICAID

## 2023-06-10 ENCOUNTER — Other Ambulatory Visit: Payer: Self-pay

## 2023-06-10 ENCOUNTER — Telehealth (INDEPENDENT_AMBULATORY_CARE_PROVIDER_SITE_OTHER): Payer: MEDICAID | Admitting: Family

## 2023-06-10 DIAGNOSIS — Z794 Long term (current) use of insulin: Secondary | ICD-10-CM

## 2023-06-10 DIAGNOSIS — M549 Dorsalgia, unspecified: Secondary | ICD-10-CM | POA: Diagnosis not present

## 2023-06-10 DIAGNOSIS — E1165 Type 2 diabetes mellitus with hyperglycemia: Secondary | ICD-10-CM | POA: Diagnosis not present

## 2023-06-10 DIAGNOSIS — Z7984 Long term (current) use of oral hypoglycemic drugs: Secondary | ICD-10-CM

## 2023-06-10 DIAGNOSIS — E1141 Type 2 diabetes mellitus with diabetic mononeuropathy: Secondary | ICD-10-CM

## 2023-06-10 DIAGNOSIS — G8929 Other chronic pain: Secondary | ICD-10-CM

## 2023-06-10 MED ORDER — DULOXETINE HCL 20 MG PO CPEP
20.0000 mg | ORAL_CAPSULE | Freq: Every day | ORAL | 0 refills | Status: DC
Start: 2023-06-10 — End: 2023-09-07
  Filled 2023-06-10: qty 90, 90d supply, fill #0
  Filled 2023-07-21: qty 30, 30d supply, fill #0

## 2023-06-10 NOTE — Progress Notes (Signed)
 Virtual Visit via Video Note  I connected with [name] [name], on 06/10/2023 at 11:15 AM by video and verified that I am speaking with the correct person using two identifiers.  Consent: I discussed the limitations, risks, security and privacy concerns of performing an evaluation and management service by video and the availability of in person appointments. I also discussed with the patient that there may be a patient responsible charge related to this service. The patient expressed understanding and agreed to proceed.   Location of Patient: Home  Location of Provider: [facility] at [facility]   Persons participating in Telemedicine visit: [name] Awilda Lennox, NP [name] [name], CMA  History of Present Illness: [name] [name] is a 53 y.o. male who presents for follow-up.  His issues/concerns for discussion today: - States lower back pain persisting. Denies recent trauma/injury and red flag symptoms. States he would like an MRI.  - Established with Endocrinology for diabetes management. States his diabetes doctor told him that she would not complete disability paperwork for him. Patient states he would like a referral to a new endocrinologist to see if they can determine if he is disabled. States he doesn't feel Gabapentin  is helping with his neuropathy as it did in the past. - States he has an upcoming annual physical exam scheduled.   Past Medical History:  Diagnosis Date   Diabetes mellitus without complication (HCC)    Diabetes mellitus, type II (HCC)    Hyperlipidemia    No Known Allergies  Current Outpatient Medications on File Prior to Visit  Medication Sig Dispense Refill   Accu-Chek Softclix Lancets lancets Testing 4 (four) times daily -  before meals and at bedtime. Use as directed. 100 each 5   Blood Glucose Monitoring Suppl (ACCU-CHEK GUIDE) w/Device KIT 1 each by Other route 4 (four) times daily -  before meals and at bedtime. Check blood sugar  once daily in the morning before taking Lantus  1 kit 0   cloNIDine  (CATAPRES ) 0.1 MG tablet Take 1 tablet (0.1 mg total) by mouth in the morning AND 2 tablets (0.2 mg total) at bedtime. 90 tablet 1   Continuous Glucose Sensor (FREESTYLE LIBRE 3 SENSOR) MISC Place 1 sensor on the skin every 14 days. Use to check glucose continuously 2 each 4   empagliflozin  (JARDIANCE ) 25 MG TABS tablet Take 1 tablet (25 mg total) by mouth daily before breakfast. 90 tablet 1   FLUoxetine  (PROZAC ) 20 MG capsule Take 3 capsules (60 mg total) by mouth daily. 90 capsule 1   gabapentin  (NEURONTIN ) 100 MG capsule Take 2 capsules (200 mg total) by mouth 3 (three) times daily. 180 capsule 2   glucose blood (ACCU-CHEK GUIDE) test strip Testing 4 (four) times daily -  before meals and at bedtime. Use as instructed 100 each 12   hydrOXYzine  (VISTARIL ) 25 MG capsule Take 1 capsule (25 mg total) by mouth daily as needed. 30 capsule 1   insulin  glargine (LANTUS  SOLOSTAR) 100 UNIT/ML Solostar Pen Inject 28-30 Units into the skin at bedtime. 27 mL 2   insulin  lispro (HUMALOG  KWIKPEN) 100 UNIT/ML KwikPen Inject 14-20 Units into the skin 3 (three) times daily. 54 mL 0   Insulin  Pen Needle (PEN NEEDLES) 32G X 4 MM MISC USE AS DIRECTED in the morning, at noon, in the evening, and at bedtime. 200 each 2   Insulin  Pen Needle (TECHLITE PEN NEEDLES) 32G X 4 MM MISC Use as directed daily. 100 each 0   rosuvastatin  (  CRESTOR ) 10 MG tablet Take 1 tablet (10 mg total) by mouth daily. 90 tablet 3   traMADol (ULTRAM) 50 MG tablet Take 50 mg by mouth every 6 (six) hours as needed.     No current facility-administered medications on file prior to visit.    Observations/Objective: Alert and oriented x 3. Not in acute distress. Physical examination not completed as this is a telemedicine visit.  Assessment and Plan: 1. Chronic back pain, unspecified back location, unspecified back pain laterality (Primary) - Diagnostic xray lumbar spine for  evaluation. I discussed with patient in detail to begin with diagnostic xray lumbar spine and once results return to consider MRI of back. Patient verbalized understanding/agreement.  - Patient advised to discontinue Gabapentin .  - Trial Duloxetine as prescribed. Counseled on medication adherence/adverse effects.  - Referral to Orthopedic Surgery for evaluation/management. - Follow-up with primary provider as scheduled. - Ambulatory referral to Orthopedic Surgery - DG Lumbar Spine Complete; Future - DULoxetine (CYMBALTA) 20 MG capsule; Take 1 capsule (20 mg total) by mouth daily.  Dispense: 90 capsule; Refill: 0  2. Uncontrolled type 2 diabetes mellitus with hyperglycemia (HCC) - Patient established with Endocrinology for diabetes management. Patient states his diabetes doctor told him that she will not complete disability paperwork for him. Patient states he would like a referral to a new endocrinologist to see if they can determine if he is disabled. Patient encouraged to keep established endocrinologist until established with new endocrinologist. Patient verbalized understanding/agreement.  - Referral to Endocrinology for evaluation/management. - Ambulatory referral to Endocrinology  3. Diabetic mononeuropathy associated with type 2 diabetes mellitus (HCC) - Patient advised to discontinue Gabapentin .  - Trial Duloxetine as prescribed. Counseled on medication adherence/adverse effects.  - Referral to Endocrinology for evaluation/management. - Ambulatory referral to Endocrinology - DULoxetine (CYMBALTA) 20 MG capsule; Take 1 capsule (20 mg total) by mouth daily.  Dispense: 90 capsule; Refill: 0   Follow Up Instructions: Follow-up with primary provider as scheduled.   Patient was given clear instructions to go to Emergency Department or return to medical center if symptoms don't improve, worsen, or new problems develop.The patient verbalized understanding.  I discussed the assessment and  treatment plan with the patient. The patient was provided an opportunity to ask questions and all were answered. The patient agreed with the plan and demonstrated an understanding of the instructions.   The patient was advised to call back or seek an in-person evaluation if the symptoms worsen or if the condition fails to improve as anticipated.     I provided 15 minutes total of non-face-to-face time during this encounter.   Senaida Dama, NP  [facility] [place], [place] [phone] 06/10/2023, 11:53 AM

## 2023-06-21 ENCOUNTER — Other Ambulatory Visit: Payer: Self-pay

## 2023-06-23 ENCOUNTER — Ambulatory Visit: Payer: MEDICAID | Admitting: Physical Medicine and Rehabilitation

## 2023-06-29 ENCOUNTER — Encounter: Payer: Self-pay | Admitting: "Endocrinology

## 2023-06-29 ENCOUNTER — Other Ambulatory Visit: Payer: Self-pay | Admitting: Family Medicine

## 2023-06-29 ENCOUNTER — Other Ambulatory Visit: Payer: Self-pay | Admitting: Family

## 2023-06-29 ENCOUNTER — Other Ambulatory Visit (HOSPITAL_COMMUNITY): Payer: Self-pay

## 2023-06-29 ENCOUNTER — Ambulatory Visit (INDEPENDENT_AMBULATORY_CARE_PROVIDER_SITE_OTHER): Payer: MEDICAID | Admitting: "Endocrinology

## 2023-06-29 ENCOUNTER — Other Ambulatory Visit: Payer: Self-pay

## 2023-06-29 VITALS — BP 130/80 | HR 56 | Ht 69.0 in | Wt 199.0 lb

## 2023-06-29 DIAGNOSIS — E1141 Type 2 diabetes mellitus with diabetic mononeuropathy: Secondary | ICD-10-CM

## 2023-06-29 DIAGNOSIS — E782 Mixed hyperlipidemia: Secondary | ICD-10-CM | POA: Diagnosis not present

## 2023-06-29 DIAGNOSIS — Z794 Long term (current) use of insulin: Secondary | ICD-10-CM | POA: Diagnosis not present

## 2023-06-29 DIAGNOSIS — E1165 Type 2 diabetes mellitus with hyperglycemia: Secondary | ICD-10-CM | POA: Diagnosis not present

## 2023-06-29 DIAGNOSIS — Z7984 Long term (current) use of oral hypoglycemic drugs: Secondary | ICD-10-CM

## 2023-06-29 MED ORDER — GABAPENTIN 100 MG PO CAPS
200.0000 mg | ORAL_CAPSULE | Freq: Three times a day (TID) | ORAL | 2 refills | Status: DC
  Filled 2023-06-29: qty 180, 30d supply, fill #0

## 2023-06-29 MED ORDER — FREESTYLE LIBRE 3 SENSOR MISC
4 refills | Status: DC
  Filled 2023-06-29 – 2023-07-21 (×2): qty 2, 28d supply, fill #0
  Filled 2023-09-07: qty 2, 28d supply, fill #1

## 2023-06-29 NOTE — Patient Instructions (Addendum)
 Recommend the following: Jardiance  25 mg once a day  Lantus  28 units once every night Humalog  10 units for break fast, 10 units for lunch and 11 units for supper meals, three times a day 15 min before each meal  Humalog  Correction scale:  151 - 175: 1 unit 176 - 200: 2 units 201 - 225: 3 units 226 - 250: 4 units 251 - 275: 5 units 276 - 300: 6 units 301 - 325: 7 units 326 - 350: 8 units 351 - 375: 9 units 376 - 400: 10 units

## 2023-06-29 NOTE — Progress Notes (Signed)
 Outpatient Endocrinology Note [name] Newcomer, MD  06/29/23   [name] [name] 08-02-1970 [id number]  Referring Provider: Senaida Dama, NP Primary Care Provider: Senaida Dama, NP Reason for consultation: Subjective   Assessment & Plan  Diagnoses and all orders for this visit:  Uncontrolled type 2 diabetes mellitus with hyperglycemia (HCC)  Long term (current) use of oral hypoglycemic drugs  Long-term insulin  use (HCC)  Mixed hypercholesterolemia and hypertriglyceridemia   Diabetes Type II complicated by neuropathy, No results found for: GFR Hba1c goal less than 7, current Hba1c is 13.5 Lab Results  Component Value Date   HGBA1C 9.2 (A) 05/19/2023   Ordered DM education previously  Jardiance  25 mg qam  Lantus  28 units once  every night Humalog  10 units for break fast, 10 units for lunch and 11 units for supper meals, three times a day 15 min before each meal  Humalog  Correction scale: Use in addition to your meal time/short acting insulin  based on blood sugars as follows:  151 - 180: 1 unit 181 - 210: 2 units 211 - 240: 3 units 241 - 270: 4 units 271 - 300: 5 units 301 - 330: 6 units 331 - 360: 7 units 361 - 390: 8 units 391 - 420: 9 units  No known contraindications/side effects to any of above medications Has libre 3 sensor   -Last LD and Tg are as follows: Lab Results  Component Value Date   LDLCALC 147 (H) 01/04/2023    Lab Results  Component Value Date   TRIG 231 (H) 01/04/2023   -02/03/23: started rosuvastatin  10 mg every day, non-complaint  -Follow low fat diet and exercise   -Blood pressure goal <140/90 - Microalbumin/creatinine goal is < 30 -Last MA/Cr is as follows: Lab Results  Component Value Date   MICROALBUR 0.9 01/04/2023   -not on ACE/ARB  -diet changes including salt restriction -limit eating outside -counseled BP targets per standards of diabetes care -uncontrolled blood pressure can lead to retinopathy, nephropathy and  cardiovascular and atherosclerotic heart disease  Reviewed and counseled on: -A1C target -Blood sugar targets -Complications of uncontrolled diabetes  -Checking blood sugar before meals and bedtime and bring log next visit -All medications with mechanism of action and side effects -Hypoglycemia management: rule of 15's, Glucagon Emergency Kit and medical alert ID -low-carb low-fat plate-method diet -At least 20 minutes of physical activity per day -Annual dilated retinal eye exam and foot exam -compliance and follow up needs -follow up as scheduled or earlier if problem gets worse  Call if blood sugar is less than 70 or consistently above 250    Take a 15 gm snack of carbohydrate at bedtime before you go to sleep if your blood sugar is less than 100.    If you are going to fast after midnight for a test or procedure, ask your physician for instructions on how to reduce/decrease your insulin  dose.    Call if blood sugar is less than 70 or consistently above 250  -Treating a low sugar by rule of 15  (15 gms of sugar every 15 min until sugar is more than 70) If you feel your sugar is low, test your sugar to be sure If your sugar is low (less than 70), then take 15 grams of a fast acting Carbohydrate (3-4 glucose tablets or glucose gel or 4 ounces of juice or regular soda) Recheck your sugar 15 min after treating low to make sure it is more than 70 If sugar  is still less than 70, treat again with 15 grams of carbohydrate          Don't drive the hour of hypoglycemia  If unconscious/unable to eat or drink by mouth, use glucagon injection or nasal spray baqsimi and call 911. Can repeat again in 15 min if still unconscious.  Return in about 2 months (around 08/29/2023).   I have reviewed current medications, nurse's notes, allergies, vital signs, past medical and surgical history, family medical history, and social history for this encounter. Counseled patient on symptoms, examination  findings, lab findings, imaging results, treatment decisions and monitoring and prognosis. The patient understood the recommendations and agrees with the treatment plan. All questions regarding treatment plan were fully answered.  [name] Newcomer, MD  06/29/23   History of Present Illness [name] [name] is a 53 y.o. year old male who presents for follow up of Type II diabetes mellitus.  [name] [name] was first diagnosed in 2020.   Diabetes education +  Home diabetes regimen: Jardiance  25 mg qam  Lantus  25-28 units once  every night Humalog  10 units for meals, three times a day 15 min before each meal   COMPLICATIONS -  MI/Stroke -  retinopathy +  neuropathy -  nephropathy  BLOOD SUGAR DATA CGM interpretation: At today's visit, we reviewed her CGM downloads. The full report is scanned in the media. Reviewing the CGM trends, BG are elevated most of the time.  Physical Exam  BP 130/80   Pulse (!) 56   Ht 5' 9 (1.753 m)   Wt 199 lb (90.3 kg)   SpO2 98%   BMI 29.39 kg/m    Constitutional: well developed, well nourished Head: normocephalic, atraumatic Eyes: sclera anicteric, no redness Neck: supple Lungs: normal respiratory effort Neurology: alert and oriented Skin: dry, no appreciable rashes Musculoskeletal: no appreciable defects Psychiatric: normal mood and affect Diabetic Foot Exam - Simple   No data filed    Current Medications Patient's Medications  New Prescriptions   No medications on file  Previous Medications   ACCU-CHEK SOFTCLIX LANCETS LANCETS    Testing 4 (four) times daily -  before meals and at bedtime. Use as directed.   BLOOD GLUCOSE MONITORING SUPPL (ACCU-CHEK GUIDE) W/DEVICE KIT    1 each by Other route 4 (four) times daily -  before meals and at bedtime. Check blood sugar once daily in the morning before taking Lantus    CLONIDINE  (CATAPRES ) 0.1 MG TABLET    Take 1 tablet (0.1 mg total) by mouth in the morning AND 2 tablets (0.2 mg total) at bedtime.    CONTINUOUS GLUCOSE SENSOR (FREESTYLE LIBRE 3 SENSOR) MISC    Place 1 sensor on the skin every 14 days. Use to check glucose continuously   DULOXETINE  (CYMBALTA ) 20 MG CAPSULE    Take 1 capsule (20 mg total) by mouth daily.   EMPAGLIFLOZIN  (JARDIANCE ) 25 MG TABS TABLET    Take 1 tablet (25 mg total) by mouth daily before breakfast.   FLUOXETINE  (PROZAC ) 20 MG CAPSULE    Take 3 capsules (60 mg total) by mouth daily.   GABAPENTIN  (NEURONTIN ) 100 MG CAPSULE    Take 2 capsules (200 mg total) by mouth 3 (three) times daily.   GLUCOSE BLOOD (ACCU-CHEK GUIDE) TEST STRIP    Testing 4 (four) times daily -  before meals and at bedtime. Use as instructed   HYDROXYZINE  (VISTARIL ) 25 MG CAPSULE    Take 1 capsule (25 mg total) by mouth daily as needed.  INSULIN  GLARGINE (LANTUS  SOLOSTAR) 100 UNIT/ML SOLOSTAR PEN    Inject 28-30 Units into the skin at bedtime.   INSULIN  LISPRO (HUMALOG  KWIKPEN) 100 UNIT/ML KWIKPEN    Inject 14-20 Units into the skin 3 (three) times daily.   INSULIN  PEN NEEDLE (PEN NEEDLES) 32G X 4 MM MISC    USE AS DIRECTED in the morning, at noon, in the evening, and at bedtime.   INSULIN  PEN NEEDLE (TECHLITE PEN NEEDLES) 32G X 4 MM MISC    Use as directed daily.   ROSUVASTATIN  (CRESTOR ) 10 MG TABLET    Take 1 tablet (10 mg total) by mouth daily.   TRAMADOL (ULTRAM) 50 MG TABLET    Take 50 mg by mouth every 6 (six) hours as needed.  Modified Medications   No medications on file  Discontinued Medications   No medications on file    Allergies No Known Allergies  Past Medical History Past Medical History:  Diagnosis Date   Diabetes mellitus without complication (HCC)    Diabetes mellitus, type II (HCC)    Hyperlipidemia     Past Surgical History History reviewed. No pertinent surgical history.  Family History family history includes Diabetes in his paternal grandfather and paternal grandmother; Stroke in his paternal grandfather.  Social History Social History   Socioeconomic  History   Marital status: Single    Spouse name: Not on file   Number of children: Not on file   Years of education: Not on file   Highest education level: GED or equivalent  Occupational History   Not on file  Tobacco Use   Smoking status: Never    Passive exposure: Current   Smokeless tobacco: Current  Vaping Use   Vaping status: Never Used  Substance and Sexual Activity   Alcohol use: Never   Drug use: Never   Sexual activity: Yes  Other Topics Concern   Not on file  Social History Narrative   Not on file   Social Drivers of Health   Financial Resource Strain: High Risk (06/11/2022)   Overall Financial Resource Strain (CARDIA)    Difficulty of Paying Living Expenses: Very hard  Food Insecurity: Food Insecurity Present (06/11/2022)   Hunger Vital Sign    Worried About Running Out of Food in the Last Year: Often true    Ran Out of Food in the Last Year: Often true  Transportation Needs: Unmet Transportation Needs (06/11/2022)   PRAPARE - Administrator, Civil Service (Medical): Yes    Lack of Transportation (Non-Medical): Yes  Physical Activity: Inactive (11/25/2022)   Exercise Vital Sign    Days of Exercise per Week: 0 days    Minutes of Exercise per Session: 0 min  Stress: Stress Concern Present (11/25/2022)   [employer] of Occupational Health - Occupational Stress Questionnaire    Feeling of Stress : Very much  Social Connections: Moderately Integrated (11/25/2022)   Social Connection and Isolation Panel    Frequency of Communication with Friends and Family: More than three times a week    Frequency of Social Gatherings with Friends and Family: Never    Attends Religious Services: 1 to 4 times per year    Active Member of [employer] or Organizations: No    Attends Banker Meetings: Never    Marital Status: Living with partner  Intimate Partner Violence: Not At Risk (11/25/2022)   Humiliation, Afraid, Rape, and Kick questionnaire    Fear  of Current or Ex-Partner: No  Emotionally Abused: No    Physically Abused: No    Sexually Abused: No    Lab Results  Component Value Date   HGBA1C 9.2 (A) 05/19/2023   HGBA1C 13.5 (A) 01/12/2023   HGBA1C CANCELED 01/04/2023   Lab Results  Component Value Date   CHOL 231 (H) 01/04/2023   Lab Results  Component Value Date   HDL 47 01/04/2023   Lab Results  Component Value Date   LDLCALC 147 (H) 01/04/2023   Lab Results  Component Value Date   TRIG 231 (H) 01/04/2023   Lab Results  Component Value Date   CHOLHDL 4.9 01/04/2023   Lab Results  Component Value Date   CREATININE 1.26 01/04/2023   No results found for: GFR Lab Results  Component Value Date   MICROALBUR 0.9 01/04/2023      Component Value Date/Time   NA 134 (L) 01/04/2023 0949   NA 133 (L) 06/15/2022 1436   K 4.7 01/04/2023 0949   CL 97 (L) 01/04/2023 0949   CO2 29 01/04/2023 0949   GLUCOSE 482 (H) 01/04/2023 0949   BUN 15 01/04/2023 0949   BUN 8 06/15/2022 1436   CREATININE 1.26 01/04/2023 0949   CALCIUM  9.6 01/04/2023 0949   PROT 7.7 01/04/2023 0949   PROT 6.7 10/30/2020 1322   ALBUMIN 4.4 10/30/2020 1322   AST 13 01/04/2023 0949   ALT 16 01/04/2023 0949   ALKPHOS 56 10/30/2020 1322   BILITOT 0.5 01/04/2023 0949   BILITOT 0.4 10/30/2020 1322   GFRNONAA >60 06/16/2022 1016   GFRAA >60 09/10/2019 0533      Latest Ref Rng & Units 01/04/2023    9:49 AM 06/16/2022   10:16 AM 06/15/2022    2:36 PM  BMP  Glucose 65 - 99 mg/dL 161  096  045   BUN 7 - 25 mg/dL 15  10  8    Creatinine 0.70 - 1.30 mg/dL 4.09  8.11  9.14   BUN/Creat Ratio 6 - 22 (calc) SEE NOTE:   7   Sodium 135 - 146 mmol/L 134  130  133   Potassium 3.5 - 5.3 mmol/L 4.7  3.8  4.6   Chloride 98 - 110 mmol/L 97  96  94   CO2 20 - 32 mmol/L 29  24  21    Calcium  8.6 - 10.3 mg/dL 9.6  9.6  9.6        Component Value Date/Time   WBC 6.2 06/16/2022 1016   RBC 5.14 06/16/2022 1016   HGB 15.1 06/16/2022 1016   HGB 17.6  10/07/2018 1012   HCT 43.7 06/16/2022 1016   HCT 50.7 10/07/2018 1012   PLT 193 06/16/2022 1016   PLT 249 10/07/2018 1012   MCV 85.0 06/16/2022 1016   MCV 85 10/07/2018 1012   MCH 29.4 06/16/2022 1016   MCHC 34.6 06/16/2022 1016   RDW 12.4 06/16/2022 1016   RDW 12.6 10/07/2018 1012   LYMPHSABS 1.6 01/26/2021 2201   MONOABS 1.1 (H) 01/26/2021 2201   EOSABS 0.1 01/26/2021 2201   BASOSABS 0.0 01/26/2021 2201     Parts of this note may have been dictated using voice recognition software. There may be variances in spelling and vocabulary which are unintentional. Not all errors are proofread. Please notify the [facility] if any discrepancies are noted or if the meaning of any statement is not clear.

## 2023-06-29 NOTE — Telephone Encounter (Signed)
 Complete

## 2023-06-30 ENCOUNTER — Other Ambulatory Visit: Payer: Self-pay

## 2023-07-01 ENCOUNTER — Other Ambulatory Visit (HOSPITAL_COMMUNITY): Payer: Self-pay

## 2023-07-01 ENCOUNTER — Telehealth: Payer: Self-pay | Admitting: Pharmacy Technician

## 2023-07-01 ENCOUNTER — Other Ambulatory Visit: Payer: Self-pay

## 2023-07-01 NOTE — Telephone Encounter (Signed)
 Pharmacy Patient Advocate Encounter  Received notification from [facility] that Prior Authorization for Jardiance  25MG  tablets has been APPROVED from 07/01/2023 to 06/30/2024. Ran test claim, Copay is $4.00. This test claim was processed through [facility]- copay amounts may vary at other pharmacies due to pharmacy/plan contracts, or as the patient moves through the different stages of their insurance plan.   PA #/Case ID/Reference #: [id number]

## 2023-07-01 NOTE — Telephone Encounter (Signed)
 Pharmacy Patient Advocate Encounter   Received notification from CoverMyMeds that prior authorization for Jardiance  25MG  tablets is required/requested.   Insurance verification completed.   The patient is insured through [place] .   Per test claim: PA required; PA submitted to above mentioned insurance via CoverMyMeds Key/confirmation #/EOC [id number] Status is pending

## 2023-07-05 ENCOUNTER — Ambulatory Visit (INDEPENDENT_AMBULATORY_CARE_PROVIDER_SITE_OTHER): Payer: MEDICAID | Admitting: Mental Health

## 2023-07-05 DIAGNOSIS — F411 Generalized anxiety disorder: Secondary | ICD-10-CM | POA: Diagnosis not present

## 2023-07-05 DIAGNOSIS — F4310 Post-traumatic stress disorder, unspecified: Secondary | ICD-10-CM | POA: Diagnosis not present

## 2023-07-05 DIAGNOSIS — F332 Major depressive disorder, recurrent severe without psychotic features: Secondary | ICD-10-CM

## 2023-07-05 NOTE — Progress Notes (Unsigned)
 THERAPIST PROGRESS NOTE Virtual Visit via Video Note  I connected with Bennie Dawn on 07/05/23 at  9:00 AM EDT by a video enabled telemedicine application and verified that I am speaking with the correct person using two identifiers.  Location: Patient: [address] [facility] Provider: home office   I discussed the limitations of evaluation and management by telemedicine and the availability of in person appointments. The patient expressed understanding and agreed to proceed.  I discussed the assessment and treatment plan with the patient. The patient was provided an opportunity to ask questions and all were answered. The patient agreed with the plan and demonstrated an understanding of the instructions.   The patient was advised to call back or seek an in-person evaluation if the symptoms worsen or if the condition fails to improve as anticipated.  I provided 50 minutes of non-face-to-face time during this encounter.   Ty Bernice Savant, [facility]   Session Time: 9:04 am (50 minutes)  Participation Level: Active  Behavioral Response: CasualAlertWNL  Type of Therapy: Individual Therapy  Treatment Goals addressed:  STG:My Anger Brody will increase management of his anger AEB development of x 3 effective coping skills for anger with no more than x 3 anger outburst per week within the next 90 days.   ProgressTowards Goals: Progressing  Interventions: Supportive  Summary: Marshon Bangs is a 53 y.o. male who presents with dx Intermittent Explosive disorder, GAD and PTSD, major depression. Presents for session alert and oriented; mood and affect adequate. Speech clear and coherent at normal rate and tone. Thought process linear; goal directed. Engaged and receptive to interventions. Shares ongoing stressors with homelessness and shares to currently be with a friend in order to hold virtual session. Shares some improvement, has filed for disability and for girlfriend and he to be  getting a long better noting better communication. Shares ongoing anger episodes occurring approximately x 1 weekly but shares has been able to cope better with walking away. Shares ongoing difficulty managing diabetes and currently to be insulin  dependent. Shares to have switched over medicaid to [facility] and continues to wait on wait list for ACTT referral with Psychotherapeutic alternatives. Shares ongoing difficulty with stability in the community, difficulty obtaining items he needs. Shares exploration of work in able to obtain work from home position but lacks laptop. Agreeable to CST referral for additional support in community as well as following up on Futures trader. Denies current safety concerns.    Suicidal/Homicidal: Nowithout intent/plan  Therapist Response:  Therapist engaged Irish in tele-therapy session. Completed check in and current stressors. Assessed for current level of stressors, sxs management and level of functioning. Explored current stability in community and access to resources and basic necessities of food and shelter. Explored options for family support and exploring options. Educated on ability to obtain care Production designer, theatre/television/film with [facility] to support in access to resources and educated on [employer]. Explored ability to manage anger and presence of anger episodes and use of coping skills. Placed referral for CST with [facility]  Plan: Return again in  x 7 weeks.  Diagnosis: PTSD (post-traumatic stress disorder)  Severe episode of recurrent major depressive disorder, without psychotic features (HCC)  GAD (generalized anxiety disorder)  Collaboration of Care: Other Referral to Care management; CST  Patient/Guardian was advised Release of Information must be obtained prior to any record release in order to collaborate their care with an outside provider. Patient/Guardian was advised if they have not already done so to  contact the registration department to sign all  necessary forms in order for us  to release information regarding their care.   Consent: Patient/Guardian gives verbal consent for treatment and assignment of benefits for services provided during this visit. Patient/Guardian expressed understanding and agreed to proceed.   Ty Asal [place], [facility] 07/05/2023

## 2023-07-07 ENCOUNTER — Other Ambulatory Visit (INDEPENDENT_AMBULATORY_CARE_PROVIDER_SITE_OTHER): Payer: MEDICAID

## 2023-07-07 ENCOUNTER — Encounter: Payer: Self-pay | Admitting: Physical Medicine and Rehabilitation

## 2023-07-07 ENCOUNTER — Ambulatory Visit: Payer: MEDICAID | Admitting: Physical Medicine and Rehabilitation

## 2023-07-07 DIAGNOSIS — M5441 Lumbago with sciatica, right side: Secondary | ICD-10-CM

## 2023-07-07 DIAGNOSIS — M5416 Radiculopathy, lumbar region: Secondary | ICD-10-CM

## 2023-07-07 DIAGNOSIS — G8929 Other chronic pain: Secondary | ICD-10-CM | POA: Diagnosis not present

## 2023-07-07 DIAGNOSIS — R202 Paresthesia of skin: Secondary | ICD-10-CM

## 2023-07-07 NOTE — Progress Notes (Signed)
 [name] - 53 y.o. male MRN [id number]  Date of birth: 07/27/1970  Office Visit Note: Visit Date: 07/07/2023 PCP: Lorren Greig PARAS, NP Referred by: Lorren Greig PARAS, NP  Subjective: Chief Complaint  Patient presents with   Lower Back - Pain   HPI: [name] [name] is a 53 y.o. male who comes in today per the request of Greig Lorren, NP for evaluation of chronic, worsening and severe bilateral lower back pain radiating up to thoracic back, intermittent radiation of pain to anterior thigh down to knee. Reports pain radiating to right anterior thigh is more of a quick shocking sensation. Also reports numbness/tingling to bilateral legs. Pain ongoing since early teenage years. His pain worsens with activity and prolonged standing and sitting. He describes pain as squeezing and tight sensation, currently rates as 7 out of 10. Some relief of pain with home exercise regimen, rest and use of medications. Gabapentin  was recently discontinued by PCP, new prescription for Cymbalta . He has not been able to pick up Cymbalta  from pharmacy. No history of formal physical therapy. No recent imaging of lumbar spine. No history of lumbar surgery/injections. Patient denies focal weakness. No recent trauma or falls.   Patients course is complicated by diabetes mellitus, diabetic polyneuropathy, depression, anxiety, intermittent explosive disorder and PTSD. He is established with Dr. Dartha with [facility] for management of diabetes. Recent A1C on 05/19/2023 was 9.2.     Review of Systems  Musculoskeletal:  Positive for back pain.  Neurological:  Positive for tingling. Negative for focal weakness and weakness.  All other systems reviewed and are negative.  Otherwise per HPI.  Assessment & Plan: Visit Diagnoses:    ICD-10-CM   1. Chronic bilateral low back pain with right-sided sciatica  G89.29 XR Lumbar Spine 2-3 Views   M54.41 MR LUMBAR SPINE WO CONTRAST    2. Radiculopathy, lumbar region   M54.16 XR Lumbar Spine 2-3 Views    MR LUMBAR SPINE WO CONTRAST    3. Paresthesia of skin  R20.2 XR Lumbar Spine 2-3 Views    MR LUMBAR SPINE WO CONTRAST       Plan: Findings:  Chronic, worsening and severe bilateral lower back pain radiating up to bilateral thoracic back, intermittent radiation of pain down right anterior thigh to knee. Patient continues to have severe pain despite good conservative therapies such as formal physical therapy. Patients clinical presentation and exam are complex, differentials include lumbar radiculopathy and myofascial pain syndrome. I obtained lumbar radiographs in the office today that show normal segmentation and alignment, mild biforaminal narrowing at L5-S1, no spondylolisthesis. Given the chronicity of his symptoms and continued severe pain I placed order for lumbar MRI imaging. Depending on results of lumbar MRI imaging we discussed possibility of performing lumbar injections. Would also consider referral for short course of formal physical therapy. I will see patient back for lumbar MRI review. I encouraged him to start Cymbalta  as prescribed. No questions at this time. No red flag symptoms noted upon exam today.     Meds & Orders: No orders of the defined types were placed in this encounter.   Orders Placed This Encounter  Procedures   XR Lumbar Spine 2-3 Views   MR LUMBAR SPINE WO CONTRAST    Follow-up: Return for Lumbar MRI review.   Procedures: No procedures performed      Clinical History: No specialty comments available.   He reports that he has never smoked. He has been exposed to tobacco smoke. He uses  smokeless tobacco.  Recent Labs    01/04/23 0952 01/12/23 0831 05/19/23 1359  HGBA1C CANCELED 13.5* 9.2*    Objective:  VS:  HT:    WT:   BMI:     BP:   HR: bpm  TEMP: ( )  RESP:  Physical Exam Vitals and nursing note reviewed.  HENT:     Head: Normocephalic and atraumatic.     Right Ear: External ear normal.     Left Ear:  External ear normal.     Nose: Nose normal.     Mouth/Throat:     Mouth: Mucous membranes are moist.   Eyes:     Extraocular Movements: Extraocular movements intact.    Cardiovascular:     Rate and Rhythm: Normal rate.     Pulses: Normal pulses.  Pulmonary:     Effort: Pulmonary effort is normal.  Abdominal:     General: Abdomen is flat. There is no distension.   Musculoskeletal:        General: Tenderness present.     Cervical back: Normal range of motion.     Comments: Patient rises from seated position to standing without difficulty. Good lumbar range of motion. No pain noted with facet loading. 5/5 strength noted with bilateral hip flexion, knee flexion/extension, ankle dorsiflexion/plantarflexion and EHL. No clonus noted bilaterally. No pain upon palpation of greater trochanters. No pain with internal/external rotation of bilateral hips. Sensation intact bilaterally. Myofascial tenderness noted to bilateral thoracic and lumbar paraspinal regions upon palpation. Negative slump test bilaterally. Ambulates without aid, gait steady.      Skin:    General: Skin is warm and dry.     Capillary Refill: Capillary refill takes less than 2 seconds.   Neurological:     General: No focal deficit present.     Mental Status: He is alert and oriented to person, place, and time.   Psychiatric:        Mood and Affect: Mood normal.        Behavior: Behavior normal.     Ortho Exam  Imaging: No results found.   Past Medical/Family/Surgical/Social History: Medications & Allergies reviewed per EMR, new medications updated. Patient Active Problem List   Diagnosis Date Noted   PTSD (post-traumatic stress disorder) 02/02/2023   Intermittent explosive disorder 11/25/2022   MDD (major depressive disorder), recurrent episode, severe (HCC) 09/09/2022   GAD (generalized anxiety disorder) 09/09/2022   Caffeine abuse, continuous (HCC) 09/09/2022   Type 2 diabetes mellitus without complication,  without long-term current use of insulin  (HCC) 10/21/2018   Paresthesia of left upper and lower extremity 10/07/2018   Shoulder impingement syndrome, right 10/07/2018   Past Medical History:  Diagnosis Date   Diabetes mellitus without complication (HCC)    Diabetes mellitus, type II (HCC)    Hyperlipidemia    Family History  Problem Relation Age of Onset   Diabetes Paternal Grandmother    Diabetes Paternal Grandfather    Stroke Paternal Grandfather    History reviewed. No pertinent surgical history. Social History   Occupational History   Not on file  Tobacco Use   Smoking status: Never    Passive exposure: Current   Smokeless tobacco: Current  Vaping Use   Vaping status: Never Used  Substance and Sexual Activity   Alcohol use: Never   Drug use: Never   Sexual activity: Yes

## 2023-07-07 NOTE — Progress Notes (Signed)
 Pain Scale   Average Pain 5 Patient advising that his lower back pain radiates at time to his middle back  and at time to his right leg causing him to get weak         +Driver, -BT, -Dye Allergies.

## 2023-07-07 NOTE — Progress Notes (Signed)
 Core Outcome Measures Index (COMI) Back Score  Average Pain 5  COMI Score 50 %

## 2023-07-08 ENCOUNTER — Other Ambulatory Visit: Payer: Self-pay

## 2023-07-12 ENCOUNTER — Other Ambulatory Visit: Payer: Self-pay

## 2023-07-15 ENCOUNTER — Ambulatory Visit (INDEPENDENT_AMBULATORY_CARE_PROVIDER_SITE_OTHER): Payer: MEDICAID | Admitting: Family

## 2023-07-15 ENCOUNTER — Other Ambulatory Visit: Payer: Self-pay

## 2023-07-15 ENCOUNTER — Encounter: Payer: Self-pay | Admitting: Family

## 2023-07-15 VITALS — BP 121/77 | HR 74 | Ht 69.0 in | Wt 200.4 lb

## 2023-07-15 DIAGNOSIS — Z130 Encounter for screening for diseases of the blood and blood-forming organs and certain disorders involving the immune mechanism: Secondary | ICD-10-CM | POA: Diagnosis not present

## 2023-07-15 DIAGNOSIS — Z13228 Encounter for screening for other metabolic disorders: Secondary | ICD-10-CM | POA: Diagnosis not present

## 2023-07-15 DIAGNOSIS — Z1211 Encounter for screening for malignant neoplasm of colon: Secondary | ICD-10-CM

## 2023-07-15 DIAGNOSIS — Z139 Encounter for screening, unspecified: Secondary | ICD-10-CM

## 2023-07-15 DIAGNOSIS — K219 Gastro-esophageal reflux disease without esophagitis: Secondary | ICD-10-CM

## 2023-07-15 DIAGNOSIS — E785 Hyperlipidemia, unspecified: Secondary | ICD-10-CM | POA: Diagnosis not present

## 2023-07-15 DIAGNOSIS — Z0000 Encounter for general adult medical examination without abnormal findings: Secondary | ICD-10-CM | POA: Diagnosis not present

## 2023-07-15 DIAGNOSIS — Z1329 Encounter for screening for other suspected endocrine disorder: Secondary | ICD-10-CM

## 2023-07-15 MED ORDER — OMEPRAZOLE 20 MG PO CPDR
20.0000 mg | DELAYED_RELEASE_CAPSULE | Freq: Every day | ORAL | 0 refills | Status: DC
  Filled 2023-07-15: qty 90, 90d supply, fill #0

## 2023-07-15 NOTE — Progress Notes (Signed)
 Patient ID: [name] [name], male    DOB: [birth year]  MRN: [id number]  CC: Annual Exam  Subjective: [name] [name] is a 53 y.o. male who presents for annual exam.   His concerns today include:  - Acid reflux. Denies red flag symptoms. - Established with Endocrinology for diabetes and related chronic conditions management.  - Established with Psychiatry. He denies thoughts of self-harm, suicidal ideations, homicidal ideations. - States he already had xray of back and an MRI of back has already been ordered.  Patient Active Problem List   Diagnosis Date Noted   PTSD (post-traumatic stress disorder) 02/02/2023   Intermittent explosive disorder 11/25/2022   MDD (major depressive disorder), recurrent episode, severe (HCC) 09/09/2022   GAD (generalized anxiety disorder) 09/09/2022   Caffeine abuse, continuous (HCC) 09/09/2022   Type 2 diabetes mellitus without complication, without long-term current use of insulin  (HCC) 10/21/2018   Paresthesia of left upper and lower extremity 10/07/2018   Shoulder impingement syndrome, right 10/07/2018     Current Outpatient Medications on File Prior to Visit  Medication Sig Dispense Refill   Accu-Chek Softclix Lancets lancets Testing 4 (four) times daily -  before meals and at bedtime. Use as directed. 100 each 5   Blood Glucose Monitoring Suppl (ACCU-CHEK GUIDE) w/Device KIT 1 each by Other route 4 (four) times daily -  before meals and at bedtime. Check blood sugar once daily in the morning before taking Lantus  1 kit 0   cloNIDine  (CATAPRES ) 0.1 MG tablet Take 1 tablet (0.1 mg total) by mouth in the morning AND 2 tablets (0.2 mg total) at bedtime. 90 tablet 1   Continuous Glucose Sensor (FREESTYLE LIBRE 3 SENSOR) MISC Place 1 sensor on the skin every 14 days. Use to check glucose continuously 2 each 4   empagliflozin  (JARDIANCE ) 25 MG TABS tablet Take 1 tablet (25 mg total) by mouth daily before breakfast. 90 tablet 1   FLUoxetine  (PROZAC ) 20 MG  capsule Take 3 capsules (60 mg total) by mouth daily. 90 capsule 1   gabapentin  (NEURONTIN ) 100 MG capsule Take 2 capsules (200 mg total) by mouth 3 (three) times daily. 180 capsule 2   glucose blood (ACCU-CHEK GUIDE) test strip Testing 4 (four) times daily -  before meals and at bedtime. Use as instructed 100 each 12   hydrOXYzine  (VISTARIL ) 25 MG capsule Take 1 capsule (25 mg total) by mouth daily as needed. 30 capsule 1   insulin  glargine (LANTUS  SOLOSTAR) 100 UNIT/ML Solostar Pen Inject 28-30 Units into the skin at bedtime. 27 mL 2   insulin  lispro (HUMALOG  KWIKPEN) 100 UNIT/ML KwikPen Inject 14-20 Units into the skin 3 (three) times daily. (Patient taking differently: Inject 10 Units into the skin 3 (three) times daily.) 54 mL 0   Insulin  Pen Needle (PEN NEEDLES) 32G X 4 MM MISC USE AS DIRECTED in the morning, at noon, in the evening, and at bedtime. 200 each 2   Insulin  Pen Needle (TECHLITE PEN NEEDLES) 32G X 4 MM MISC Use as directed daily. 100 each 0   rosuvastatin  (CRESTOR ) 10 MG tablet Take 1 tablet (10 mg total) by mouth daily. 90 tablet 3   traMADol (ULTRAM) 50 MG tablet Take 50 mg by mouth every 6 (six) hours as needed.     DULoxetine  (CYMBALTA ) 20 MG capsule Take 1 capsule (20 mg total) by mouth daily. 90 capsule 0   No current facility-administered medications on file prior to visit.    No Known Allergies  Social  History   Socioeconomic History   Marital status: Single    Spouse name: Not on file   Number of children: Not on file   Years of education: Not on file   Highest education level: GED or equivalent  Occupational History   Not on file  Tobacco Use   Smoking status: Never    Passive exposure: Current   Smokeless tobacco: Current  Vaping Use   Vaping status: Never Used  Substance and Sexual Activity   Alcohol use: Never   Drug use: Never   Sexual activity: Yes  Other Topics Concern   Not on file  Social History Narrative   Not on file   Social Drivers of  Health   Financial Resource Strain: High Risk (06/11/2022)   Overall Financial Resource Strain (CARDIA)    Difficulty of Paying Living Expenses: Very hard  Food Insecurity: Food Insecurity Present (06/11/2022)   Hunger Vital Sign    Worried About Running Out of Food in the Last Year: Often true    Ran Out of Food in the Last Year: Often true  Transportation Needs: Unmet Transportation Needs (06/11/2022)   PRAPARE - Administrator, Civil Service (Medical): Yes    Lack of Transportation (Non-Medical): Yes  Physical Activity: Inactive (11/25/2022)   Exercise Vital Sign    Days of Exercise per Week: 0 days    Minutes of Exercise per Session: 0 min  Stress: Stress Concern Present (11/25/2022)   [employer] of Occupational Health - Occupational Stress Questionnaire    Feeling of Stress : Very much  Social Connections: Moderately Integrated (11/25/2022)   Social Connection and Isolation Panel    Frequency of Communication with Friends and Family: More than three times a week    Frequency of Social Gatherings with Friends and Family: Never    Attends Religious Services: 1 to 4 times per year    Active Member of [employer] or Organizations: No    Attends Banker Meetings: Never    Marital Status: Living with partner  Intimate Partner Violence: Not At Risk (11/25/2022)   Humiliation, Afraid, Rape, and Kick questionnaire    Fear of Current or Ex-Partner: No    Emotionally Abused: No    Physically Abused: No    Sexually Abused: No    Family History  Problem Relation Age of Onset   Diabetes Paternal Grandmother    Diabetes Paternal Grandfather    Stroke Paternal Grandfather     No past surgical history on file.  ROS: Review of Systems Negative except as stated above  PHYSICAL EXAM: BP 121/77   Pulse 74   Ht 5' 9 (1.753 m)   Wt 200 lb 6.4 oz (90.9 kg)   SpO2 98%   BMI 29.59 kg/m   Physical Exam HENT:     Head: Normocephalic and atraumatic.      Right Ear: Tympanic membrane, ear canal and external ear normal.     Left Ear: Tympanic membrane, ear canal and external ear normal.     Nose: Nose normal.     Mouth/Throat:     Mouth: Mucous membranes are moist.     Pharynx: Oropharynx is clear.  Eyes:     Extraocular Movements: Extraocular movements intact.     Conjunctiva/sclera: Conjunctivae normal.     Pupils: Pupils are equal, round, and reactive to light.  Neck:     Thyroid: No thyroid mass, thyromegaly or thyroid tenderness.  Cardiovascular:     Rate  and Rhythm: Normal rate and regular rhythm.     Pulses: Normal pulses.     Heart sounds: Normal heart sounds.  Pulmonary:     Effort: Pulmonary effort is normal.     Breath sounds: Normal breath sounds.  Abdominal:     General: Bowel sounds are normal.     Palpations: Abdomen is soft.  Genitourinary:    Comments: Patient declined. Musculoskeletal:        General: Normal range of motion.     Right shoulder: Normal.     Left shoulder: Normal.     Right upper arm: Normal.     Left upper arm: Normal.     Right elbow: Normal.     Left elbow: Normal.     Right forearm: Normal.     Left forearm: Normal.     Right wrist: Normal.     Left wrist: Normal.     Right hand: Normal.     Left hand: Normal.     Cervical back: Normal, normal range of motion and neck supple.     Thoracic back: Normal.     Lumbar back: Normal.     Right hip: Normal.     Left hip: Normal.     Right upper leg: Normal.     Left upper leg: Normal.     Right knee: Normal.     Left knee: Normal.     Right lower leg: Normal.     Left lower leg: Normal.     Right ankle: Normal.     Left ankle: Normal.     Right foot: Normal.     Left foot: Normal.  Skin:    General: Skin is warm and dry.     Capillary Refill: Capillary refill takes less than 2 seconds.  Neurological:     General: No focal deficit present.     Mental Status: He is alert and oriented to person, place, and time.  Psychiatric:         Mood and Affect: Mood normal.        Behavior: Behavior normal.     ASSESSMENT AND PLAN: 1. Annual physical exam (Primary) - Counseled on 150 minutes of exercise per week as tolerated, healthy eating (including decreased daily intake of saturated fats, cholesterol, added sugars, sodium), STI prevention, and routine healthcare maintenance.  2. Screening for metabolic disorder - Routine screening.  - CMP14+EGFR  3. Screening for deficiency anemia - Routine screening.  - CBC  4. Hyperlipidemia, unspecified hyperlipidemia type - Continue present management. Counseled on medication adherence/adverse effects. No refills needed as of present.  - Routine screening.  - Follow-up with primary provider as scheduled. - Lipid panel  5. Thyroid disorder screen - Routine screening.  - TSH  6. Colon cancer screening - Referral to Gastroenterology for colon cancer screening by colonoscopy. - Ambulatory referral to Gastroenterology  7. Gastroesophageal reflux disease, unspecified whether esophagitis present - Omeprazole  as prescribed. Counseled on medication adherence/adverse effects.  - Follow-up with primary provider in 4 weeks or sooner if needed.  - omeprazole  (PRILOSEC) 20 MG capsule; Take 1 capsule (20 mg total) by mouth daily.  Dispense: 90 capsule; Refill: 0   Patient was given the opportunity to ask questions.  Patient verbalized understanding of the plan and was able to repeat key elements of the plan. Patient was given clear instructions to go to Emergency Department or return to medical center if symptoms don't improve, worsen, or new problems develop.The patient verbalized  understanding.   Orders Placed This Encounter  Procedures   CBC   Lipid panel   CMP14+EGFR   TSH   Ambulatory referral to Gastroenterology     Requested Prescriptions   Signed Prescriptions Disp Refills   omeprazole  (PRILOSEC) 20 MG capsule 90 capsule 0    Sig: Take 1 capsule (20 mg total) by mouth  daily.    Return in about 1 year (around 07/14/2024) for Physical per patient preference.  Greig JINNY Drones, NP

## 2023-07-15 NOTE — Addendum Note (Signed)
 Addended by: Jarryd Gratz I on: 07/15/2023 10:27 AM   Modules accepted: Orders

## 2023-07-19 ENCOUNTER — Telehealth: Payer: Self-pay | Admitting: *Deleted

## 2023-07-19 NOTE — Progress Notes (Signed)
 Complex Care Management Note  Care Guide Note 07/19/2023 Name: [name] [name] MRN: [id number] DOB: 1970/08/20  [name] [name] is a 53 y.o. year old male who sees Lorren Greig PARAS, NP for primary care. I reached out to Bennie Dawn by phone today to offer complex care management services.  [name] was given information about Complex Care Management services today including:   The Complex Care Management services include support from the care team which includes your Nurse Care Manager, Clinical Social Worker, or Pharmacist.  The Complex Care Management team is here to help remove barriers to the health concerns and goals most important to you. Complex Care Management services are voluntary, and the patient may decline or stop services at any time by request to their care team member.   Complex Care Management Consent Status: Patient agreed to services and verbal consent obtained.   Follow up plan:  Telephone appointment with complex care management team member scheduled for:  07/22/23  Encounter Outcome:  Patient Scheduled  Harlene Satterfield  [facility]  [facility], [facility] Guide  Direct Dial : [phone]  Fax [phone]

## 2023-07-20 ENCOUNTER — Other Ambulatory Visit: Payer: MEDICAID

## 2023-07-21 ENCOUNTER — Other Ambulatory Visit: Payer: Self-pay

## 2023-07-22 ENCOUNTER — Other Ambulatory Visit: Payer: Self-pay

## 2023-07-22 ENCOUNTER — Other Ambulatory Visit: Payer: MEDICAID

## 2023-07-22 NOTE — Patient Instructions (Signed)
 Visit Information  Thank you for taking time to visit with me today. Please don't hesitate to contact me if I can be of assistance to you before our next scheduled appointment.  Our next appointment is by telephone on 08/12/23 at 2pm Please call the care guide team at [phone] if you need to cancel or reschedule your appointment.   Please call the Suicide and Crisis Lifeline: 988 call the [place]  National Suicide Prevention Lifeline: (229)032-8158 or TTY: (413)049-5426 TTY 8192218743) to talk to a trained counselor call 1-800-273-TALK (toll free, 24 hour hotline) go to [facility] [address] [place] [phone]) call 911 if you are experiencing a Mental Health or Behavioral Health Crisis or need someone to talk to.  Patient verbalizes understanding of instructions and care plan provided today and agrees to view in MyChart. Active MyChart status and patient understanding of how to access instructions and care plan via MyChart confirmed with patient.     Orlean Fey, BSW [place]  [facility] Social Worker, [facility] [phone]

## 2023-07-22 NOTE — Patient Outreach (Signed)
 Complex Care Management   Visit Note  07/22/2023  Name:  Taejon Irani MRN: [id number] DOB: [birth year]  Situation: Referral received for Complex Care Management related to SDOH Barriers:  Housing Transportation Food insecurity Financial Resource Strain I obtained verbal consent from Patient.  Visit completed with patient  on the phone  Background:   Past Medical History:  Diagnosis Date   Diabetes mellitus without complication (HCC)    Diabetes mellitus, type II (HCC)    Hyperlipidemia     Assessment:  BSW outreached patient today to assist with SDOH barriers. Social worker identified challenges with Housing, Transportation, [employer], and financial strain. Social worker provided patient with resources to all the barriers and connected patient with [facility]. Social worker will follow up with patient on 08/12/23 at 2pm   SDOH Interventions Today    Flowsheet Row Most Recent Value  SDOH Interventions   Food Insecurity Interventions Community Resources Provided  Housing Interventions Community Resources Provided  Transportation Interventions Community Resources Provided  Financial Strain Interventions Community Resources Provided       Recommendation:   No recommendations at this time.  Follow Up Plan:   Telephone follow up appointment date/time:  July 31 at 2pm  Orlean Fey, [place]  [facility] Social Worker, [facility] [phone]

## 2023-07-23 ENCOUNTER — Other Ambulatory Visit: Payer: Self-pay

## 2023-07-26 ENCOUNTER — Encounter: Payer: Self-pay | Admitting: Gastroenterology

## 2023-08-12 ENCOUNTER — Other Ambulatory Visit: Payer: MEDICAID

## 2023-08-12 NOTE — Patient Outreach (Signed)
 BSW contacted Mr. [name] [name] to follow up and confirm receipt of previously provided resources. Patient confirmed he had received them but requested additional information specifically related to housing. BSW provided further housing resources and referred patient to [facility] for additional support. No further needs identified at this time. BSW has closed the case.  [name] [name], BSW [place]  [facility] Social Worker, [facility] [phone]

## 2023-08-12 NOTE — Patient Instructions (Signed)

## 2023-08-17 ENCOUNTER — Telehealth: Payer: Self-pay | Admitting: *Deleted

## 2023-08-17 ENCOUNTER — Ambulatory Visit (AMBULATORY_SURGERY_CENTER): Payer: MEDICAID | Admitting: *Deleted

## 2023-08-17 ENCOUNTER — Other Ambulatory Visit: Payer: Self-pay

## 2023-08-17 VITALS — Ht 69.0 in | Wt 200.0 lb

## 2023-08-17 DIAGNOSIS — Z1211 Encounter for screening for malignant neoplasm of colon: Secondary | ICD-10-CM

## 2023-08-17 MED ORDER — NA SULFATE-K SULFATE-MG SULF 17.5-3.13-1.6 GM/177ML PO SOLN
1.0000 | Freq: Once | ORAL | 0 refills | Status: AC
  Filled 2023-08-17: qty 354, 1d supply, fill #0

## 2023-08-17 NOTE — Progress Notes (Signed)
 Pt's name and DOB verified at the beginning of the pre-visit with 2 identifiers  Pt denies any difficulty with ambulating,sitting, laying down or rolling side to side   Pt uses ambulation assistance device or has issues with mobiiity  Pt has no issues moving head neck or swallowing  No egg or soy allergy known to patient   No issues known to pt with past sedation with any surgeries or procedures  Patient denies ever being intubated  No FH of Malignant Hyperthermia  Pt is not on home 02   Pt is not on blood thinners   Pt denies issues with constipation   Pt is not on dialysis  Pt denise any abnormal heart rhythms   Pt denies any upcoming cardiac testing  Patient's chart reviewed by Norleen Schillings CNRA prior to pre-visit and patient appropriate for the [facility].  Pre-visit completed and red dot placed by patient's name on their procedure day (on provider's schedule).    Visit by phone  Pt states weight is 200 lb   Instructions reviewed. Pt given  both [facility] main # and MD on call # prior to instructions.  Informed pt to come in at the time discussed and that tit is an hour prior to the procedure time because of the need to place IV, change into gown, check and sign concent as well as meet CRNA and MD. Pt states they understand. paperwork s Pt states understanding after opportunity to ask questions the instructions given. Instructed pt to review instructions again prior to procedure and call main # given if has any questions.. Pt states they will.    Instructed pt on where to find instructions on My Chart.

## 2023-08-17 NOTE — Telephone Encounter (Signed)
 Attempt to reach pt for pre-visit. LM with call back #.  Will attempt to reach again in 5 min due to no other # listed in profile  Second attempt to reach pt

## 2023-08-19 ENCOUNTER — Other Ambulatory Visit: Payer: Self-pay

## 2023-08-27 ENCOUNTER — Encounter (HOSPITAL_COMMUNITY): Payer: Self-pay

## 2023-08-27 ENCOUNTER — Ambulatory Visit (HOSPITAL_COMMUNITY): Payer: MEDICAID | Admitting: Mental Health

## 2023-08-31 ENCOUNTER — Telehealth: Payer: Self-pay | Admitting: Gastroenterology

## 2023-08-31 ENCOUNTER — Encounter: Payer: MEDICAID | Admitting: Gastroenterology

## 2023-08-31 ENCOUNTER — Ambulatory Visit: Payer: MEDICAID | Admitting: "Endocrinology

## 2023-08-31 NOTE — Telephone Encounter (Signed)
 Inbound call from patient stating after drinking prep last night he did not have any bowel movements. States this morning he has had two bowel movements. Patient is requesting to know if he should continue with today's 10:00 am colonoscopy. Please advise, thank you

## 2023-08-31 NOTE — Telephone Encounter (Signed)
 Ok I did put him in for a double prep but I will put the miralax in the notes for his previsit, thanks!

## 2023-08-31 NOTE — Telephone Encounter (Signed)
 Sorry to hear this. Not sure if anyone has an opening to accommodate him this afternoon or tomorrow if he is able to drink another prep (could do Miralax OTC prep). If so could have him do that prep now or tonight based on openings. If not, and he is not ready for this AM, he will likely need to be rebooked for a double prep colonoscopy at another time. I am not in procedures this afternoon or tomorrow at all to accommodate him. Please keep me posted - if with more time after his prep he thinks he is clearing he can come in. Thanks

## 2023-08-31 NOTE — Telephone Encounter (Signed)
 Pt is stating he has done the prep, however, only has had murky brown coming out after only having 2 bowel movements

## 2023-08-31 NOTE — Telephone Encounter (Signed)
 Ok I will check this afternoon

## 2023-08-31 NOTE — Telephone Encounter (Signed)
 I called pt back to see about an afternoon appt and pt could not be seen this afternoon and asked to be r/s in the future with Dr Leigh, therefore, I r/s pt for 9/24

## 2023-08-31 NOTE — Telephone Encounter (Signed)
 Got it, okay sorry to hear this. Thanks for your help. ABout 2 weeks before his next scheduled colonoscopy he should be taking Miralax once or twice daily, and then do a double prep. Hopefully that will work better for him. Thanks

## 2023-09-07 ENCOUNTER — Ambulatory Visit (INDEPENDENT_AMBULATORY_CARE_PROVIDER_SITE_OTHER): Payer: MEDICAID | Admitting: "Endocrinology

## 2023-09-07 ENCOUNTER — Other Ambulatory Visit: Payer: Self-pay

## 2023-09-07 ENCOUNTER — Encounter: Payer: Self-pay | Admitting: "Endocrinology

## 2023-09-07 ENCOUNTER — Other Ambulatory Visit: Payer: Self-pay | Admitting: Family Medicine

## 2023-09-07 VITALS — BP 122/80 | HR 84 | Ht 69.0 in | Wt 198.0 lb

## 2023-09-07 DIAGNOSIS — E1165 Type 2 diabetes mellitus with hyperglycemia: Secondary | ICD-10-CM

## 2023-09-07 DIAGNOSIS — Z794 Long term (current) use of insulin: Secondary | ICD-10-CM | POA: Diagnosis not present

## 2023-09-07 DIAGNOSIS — E782 Mixed hyperlipidemia: Secondary | ICD-10-CM

## 2023-09-07 DIAGNOSIS — Z7984 Long term (current) use of oral hypoglycemic drugs: Secondary | ICD-10-CM | POA: Diagnosis not present

## 2023-09-07 LAB — POCT GLYCOSYLATED HEMOGLOBIN (HGB A1C): Hemoglobin A1C: 9 % — AB (ref 4.0–5.6)

## 2023-09-07 MED ORDER — PEN NEEDLES 32G X 4 MM MISC
1.0000 | Freq: Four times a day (QID) | 4 refills | Status: DC
  Filled 2023-09-07: qty 100, 25d supply, fill #0
  Filled 2023-11-25: qty 100, 25d supply, fill #1
  Filled 2024-01-11: qty 100, 25d supply, fill #2

## 2023-09-07 MED ORDER — PREGABALIN 50 MG PO CAPS: 50.0000 mg | ORAL_CAPSULE | Freq: Three times a day (TID) | ORAL | 3 refills | Status: DC

## 2023-09-07 NOTE — Addendum Note (Signed)
 Addended by: Tyronn Golda on: 09/07/2023 03:05 PM   Modules accepted: Orders

## 2023-09-07 NOTE — Patient Instructions (Signed)
 Recommend the following: Jardiance  25 mg qam  Lantus  30 units once every night Humalog  11 units for break fast, 11 units for lunch and 11 units for supper meals, three times a day 15 min before each meal  Humalog  Correction scale: Use in addition to your meal time/short acting insulin  based on blood sugars as follows:  151 - 180: 1 unit 181 - 210: 2 units 211 - 240: 3 units 241 - 270: 4 units 271 - 300: 5 units 301 - 330: 6 units 331 - 360: 7 units 361 - 390: 8 units 391 - 420: 9 units

## 2023-09-07 NOTE — Progress Notes (Addendum)
 Outpatient Endocrinology Note [name] Birmingham, MD  09/07/23   [name] [name] 1970-12-12 [id number]  Referring Provider: Lorren Greig PARAS, NP Primary Care Provider: Lorren Greig PARAS, NP Reason for consultation: Subjective   Assessment & Plan  Diagnoses and all orders for this visit:  Uncontrolled type 2 diabetes mellitus with hyperglycemia (HCC) -     POCT glycosylated hemoglobin (Hb A1C) -     Ambulatory referral to diabetic education -     Microalbumin / creatinine urine ratio -     Lipid panel -     Comprehensive metabolic panel with GFR  Long term (current) use of oral hypoglycemic drugs  Long-term insulin  use (HCC)  Mixed hypercholesterolemia and hypertriglyceridemia  Other orders -     Insulin  Pen Needle (PEN NEEDLES) 32G X 4 MM MISC; USE AS DIRECTED in the morning, at noon, in the evening, and at bedtime. -     pregabalin  (LYRICA ) 50 MG capsule; Take 1 capsule (50 mg total) by mouth 3 (three) times daily.  Diabetes Type II complicated by neuropathy, No results found for: GFR Hba1c goal less than 7, current Hba1c is 13.5 Lab Results  Component Value Date   HGBA1C 9.0 (A) 09/07/2023   Ordered DM education previously  Jardiance  25 mg qam  Lantus  30 units once every night Humalog  11 units for break fast, 11 units for lunch and 11 units for supper meals, three times a day 15 min before each meal  Humalog  Correction scale: Use in addition to your meal time/short acting insulin  based on blood sugars as follows:  151 - 180: 1 unit 181 - 210: 2 units 211 - 240: 3 units 241 - 270: 4 units 271 - 300: 5 units 301 - 330: 6 units 331 - 360: 7 units 361 - 390: 8 units 391 - 420: 9 units  No known contraindications/side effects to any of above medications Has libre 3 sensor   09/07/23 Stop by provider previously as it was not helping 50%, was put on Cymbalta  but patient reports side effects and continued pain. Instructed to wean off (take every other day) and  eventually stop Cymbalta  while starting Lyrica  50 mg 3 times a day see if it helps better.  Instructed to message/call if needs Cymbalta  back due to side effects post stopping.  Patient understands and agreed  -Last LD and Tg are as follows: Lab Results  Component Value Date   LDLCALC 147 (H) 01/04/2023    Lab Results  Component Value Date   TRIG 231 (H) 01/04/2023   -02/03/23: started rosuvastatin  10 mg every day -Follow low fat diet and exercise   -Blood pressure goal <140/90 - Microalbumin/creatinine goal is < 30 -Last MA/Cr is as follows: Lab Results  Component Value Date   MICROALBUR 0.9 01/04/2023   -not on ACE/ARB  -diet changes including salt restriction -limit eating outside -counseled BP targets per standards of diabetes care -uncontrolled blood pressure can lead to retinopathy, nephropathy and cardiovascular and atherosclerotic heart disease  Reviewed and counseled on: -A1C target -Blood sugar targets -Complications of uncontrolled diabetes  -Checking blood sugar before meals and bedtime and bring log next visit -All medications with mechanism of action and side effects -Hypoglycemia management: rule of 15's, Glucagon Emergency Kit and medical alert ID -low-carb low-fat plate-method diet -At least 20 minutes of physical activity per day -Annual dilated retinal eye exam and foot exam -compliance and follow up needs -follow up as scheduled or earlier if problem gets worse  Call if blood sugar is less than 70 or consistently above 250    Take a 15 gm snack of carbohydrate at bedtime before you go to sleep if your blood sugar is less than 100.    If you are going to fast after midnight for a test or procedure, ask your physician for instructions on how to reduce/decrease your insulin  dose.    Call if blood sugar is less than 70 or consistently above 250  -Treating a low sugar by rule of 15  (15 gms of sugar every 15 min until sugar is more than 70) If you feel  your sugar is low, test your sugar to be sure If your sugar is low (less than 70), then take 15 grams of a fast acting Carbohydrate (3-4 glucose tablets or glucose gel or 4 ounces of juice or regular soda) Recheck your sugar 15 min after treating low to make sure it is more than 70 If sugar is still less than 70, treat again with 15 grams of carbohydrate          Don't drive the hour of hypoglycemia  If unconscious/unable to eat or drink by mouth, use glucagon injection or nasal spray baqsimi and call 911. Can repeat again in 15 min if still unconscious.  Return in about 2 months (around 11/07/2023) for visit and 8 am labs before next visit.   I have reviewed current medications, nurse's notes, allergies, vital signs, past medical and surgical history, family medical history, and social history for this encounter. Counseled patient on symptoms, examination findings, lab findings, imaging results, treatment decisions and monitoring and prognosis. The patient understood the recommendations and agrees with the treatment plan. All questions regarding treatment plan were fully answered.  [name] Birmingham, MD  09/07/23   History of Present Illness [name] [name] is a 53 y.o. year old male who presents for follow up of Type II diabetes mellitus.  Arinze Moquin was first diagnosed in 2020.   Diabetes education +  Home diabetes regimen: Jardiance  25 mg qam  Lantus  28 units once  every night Humalog  10 units for break fast, 10 units for lunch and 11 units for supper meals, three times a day 15 min before each meal  Humalog  Correction scale: Use in addition to your meal time/short acting insulin  based on blood sugars as follows:  151 - 180: 1 unit 181 - 210: 2 units 211 - 240: 3 units 241 - 270: 4 units 271 - 300: 5 units 301 - 330: 6 units 331 - 360: 7 units 361 - 390: 8 units 391 - 420: 9 units  COMPLICATIONS -  MI/Stroke -  retinopathy +  neuropathy -  nephropathy  BLOOD SUGAR DATA CGM  interpretation: At today's visit, we reviewed her CGM downloads. The full report is scanned in the media. Reviewing the CGM trends, BG are elevated most of the time, with some lows.  Physical Exam  BP 122/80   Pulse 84   Ht 5' 9 (1.753 m)   Wt 198 lb (89.8 kg)   SpO2 96%   BMI 29.24 kg/m    Constitutional: well developed, well nourished Head: normocephalic, atraumatic Eyes: sclera anicteric, no redness Neck: supple Lungs: normal respiratory effort Neurology: alert and oriented Skin: dry, no appreciable rashes Musculoskeletal: no appreciable defects Psychiatric: normal mood and affect Diabetic Foot Exam - Simple   No data filed    Current Medications Patient's Medications  New Prescriptions   PREGABALIN  (LYRICA ) 50 MG CAPSULE  Take 1 capsule (50 mg total) by mouth 3 (three) times daily.  Previous Medications   ACCU-CHEK SOFTCLIX LANCETS LANCETS    Testing 4 (four) times daily -  before meals and at bedtime. Use as directed.   BLOOD GLUCOSE MONITORING SUPPL (ACCU-CHEK GUIDE) W/DEVICE KIT    1 each by Other route 4 (four) times daily -  before meals and at bedtime. Check blood sugar once daily in the morning before taking Lantus    CLONIDINE  (CATAPRES ) 0.1 MG TABLET    Take 1 tablet (0.1 mg total) by mouth in the morning AND 2 tablets (0.2 mg total) at bedtime.   CONTINUOUS GLUCOSE SENSOR (FREESTYLE LIBRE 3 SENSOR) MISC    Place 1 sensor on the skin every 14 days. Use to check glucose continuously.   EMPAGLIFLOZIN  (JARDIANCE ) 25 MG TABS TABLET    Take 1 tablet (25 mg total) by mouth daily before breakfast.   FLUOXETINE  (PROZAC ) 20 MG CAPSULE    Take 3 capsules (60 mg total) by mouth daily.   GLUCOSE BLOOD (ACCU-CHEK GUIDE) TEST STRIP    Testing 4 (four) times daily -  before meals and at bedtime. Use as instructed   HYDROXYZINE  (VISTARIL ) 25 MG CAPSULE    Take 1 capsule (25 mg total) by mouth daily as needed.   INSULIN  GLARGINE (LANTUS  SOLOSTAR) 100 UNIT/ML SOLOSTAR PEN    Inject  28-30 Units into the skin at bedtime.   INSULIN  LISPRO (HUMALOG  KWIKPEN) 100 UNIT/ML KWIKPEN    Inject 14-20 Units into the skin 3 (three) times daily.   INSULIN  PEN NEEDLE (TECHLITE PEN NEEDLES) 32G X 4 MM MISC    Use as directed daily.   OMEPRAZOLE  (PRILOSEC) 20 MG CAPSULE    Take 1 capsule (20 mg total) by mouth daily.   OVER THE COUNTER MEDICATION    as needed (Allergy medication).   ROSUVASTATIN  (CRESTOR ) 10 MG TABLET    Take 1 tablet (10 mg total) by mouth daily.   TRAMADOL (ULTRAM) 50 MG TABLET    Take 50 mg by mouth every 6 (six) hours as needed.  Modified Medications   Modified Medication Previous Medication   INSULIN  PEN NEEDLE (PEN NEEDLES) 32G X 4 MM MISC Insulin  Pen Needle (PEN NEEDLES) 32G X 4 MM MISC      USE AS DIRECTED in the morning, at noon, in the evening, and at bedtime.    USE AS DIRECTED in the morning, at noon, in the evening, and at bedtime.  Discontinued Medications   DULOXETINE  (CYMBALTA ) 20 MG CAPSULE    Take 1 capsule (20 mg total) by mouth daily.   GABAPENTIN  (NEURONTIN ) 100 MG CAPSULE    Take 2 capsules (200 mg total) by mouth 3 (three) times daily.    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  Social History Social History   Socioeconomic History   Marital status: Single    Spouse name: Not on file   Number of children:  Not on file   Years of education: Not on file   Highest education level: GED or equivalent  Occupational History   Not on file   Tobacco Use   Smoking status: Never    Passive exposure: Current   Smokeless tobacco: Never  Vaping Use   Vaping status: Never Used  Substance and Sexual Activity   Alcohol use: Never   Drug use: Never   Sexual activity: Yes  Other Topics Concern   Not on file  Social History Narrative   Not on file   Social Drivers of Health   Financial Resource Strain: High Risk (07/22/2023)   Overall Financial Resource Strain (CARDIA)    Difficulty of Paying Living Expenses: Hard  Food Insecurity: Food Insecurity Present (07/22/2023)   Hunger Vital Sign    Worried About Running Out of Food in the Last Year: Often true    Ran Out of Food in the Last Year: Often true  Transportation Needs: Unmet Transportation Needs (07/22/2023)   PRAPARE - Administrator, Civil Service (Medical): Yes    Lack of Transportation (Non-Medical): Yes  Physical Activity: Inactive (11/25/2022)   Exercise Vital Sign    Days of Exercise per Week: 0 days    Minutes of Exercise per Session: 0 min  Stress: Stress Concern Present (11/25/2022)   [employer] of Occupational Health - Occupational Stress Questionnaire    Feeling of Stress : Very much  Social Connections: Moderately Integrated (11/25/2022)   Social Connection and Isolation Panel    Frequency of Communication with Friends and Family: More than three times a week    Frequency of Social Gatherings with Friends and Family: Never    Attends Religious Services: 1 to 4 times per year    Active Member of Clubs or Organizations: No    Attends Banker Meetings: Never    Marital Status: Living with partner  Intimate Partner Violence: Not At Risk (11/25/2022)   Humiliation, Afraid, Rape, and Kick questionnaire    Fear of Current or Ex-Partner: No    Emotionally Abused: No    Physically Abused: No    Sexually Abused: No    Lab Results  Component Value Date   HGBA1C 9.0 (A) 09/07/2023   HGBA1C 9.2 (A) 05/19/2023   HGBA1C 13.5 (A)  01/12/2023   Lab Results  Component Value Date   CHOL 231 (H) 01/04/2023   Lab Results  Component Value Date   HDL 47 01/04/2023   Lab Results  Component Value Date   LDLCALC 147 (H) 01/04/2023   Lab Results  Component Value Date   TRIG 231 (H) 01/04/2023   Lab Results  Component Value Date   CHOLHDL 4.9 01/04/2023   Lab Results  Component Value Date   CREATININE 1.26 01/04/2023   No results found for: GFR Lab Results  Component Value Date   MICROALBUR 0.9 01/04/2023      Component Value Date/Time   NA 134 (L) 01/04/2023 0949   NA 133 (L) 06/15/2022 1436   K 4.7 01/04/2023 0949   CL 97 (L) 01/04/2023 0949   CO2 29 01/04/2023 0949   GLUCOSE 482 (H) 01/04/2023 0949   BUN 15 01/04/2023 0949   BUN 8 06/15/2022 1436   CREATININE 1.26 01/04/2023 0949   CALCIUM  9.6 01/04/2023 0949   PROT 7.7 01/04/2023 0949   PROT 6.7 10/30/2020 1322   ALBUMIN 4.4 10/30/2020 1322   AST 13 01/04/2023 0949   ALT 16 01/04/2023 0949  ALKPHOS 56 10/30/2020 1322   BILITOT 0.5 01/04/2023 0949   BILITOT 0.4 10/30/2020 1322   GFRNONAA >60 06/16/2022 1016   GFRAA >60 09/10/2019 0533      Latest Ref Rng & Units 01/04/2023    9:49 AM 06/16/2022   10:16 AM 06/15/2022    2:36 PM  BMP  Glucose 65 - 99 mg/dL 517  332  450   BUN 7 - 25 mg/dL 15  10  8    Creatinine 0.70 - 1.30 mg/dL 8.73  8.97  8.84   BUN/Creat Ratio 6 - 22 (calc) SEE NOTE:   7   Sodium 135 - 146 mmol/L 134  130  133   Potassium 3.5 - 5.3 mmol/L 4.7  3.8  4.6   Chloride 98 - 110 mmol/L 97  96  94   CO2 20 - 32 mmol/L 29  24  21    Calcium  8.6 - 10.3 mg/dL 9.6  9.6  9.6        Component Value Date/Time   WBC 6.2 06/16/2022 1016   RBC 5.14 06/16/2022 1016   HGB 15.1 06/16/2022 1016   HGB 17.6 10/07/2018 1012   HCT 43.7 06/16/2022 1016   HCT 50.7 10/07/2018 1012   PLT 193 06/16/2022 1016   PLT 249 10/07/2018 1012   MCV 85.0 06/16/2022 1016   MCV 85 10/07/2018 1012   MCH 29.4 06/16/2022 1016   MCHC 34.6 06/16/2022  1016   RDW 12.4 06/16/2022 1016   RDW 12.6 10/07/2018 1012   LYMPHSABS 1.6 01/26/2021 2201   MONOABS 1.1 (H) 01/26/2021 2201   EOSABS 0.1 01/26/2021 2201   BASOSABS 0.0 01/26/2021 2201     Parts of this note may have been dictated using voice recognition software. There may be variances in spelling and vocabulary which are unintentional. Not all errors are proofread. Please notify the dino if any discrepancies are noted or if the meaning of any statement is not clear.

## 2023-09-08 ENCOUNTER — Other Ambulatory Visit: Payer: Self-pay

## 2023-09-08 NOTE — Telephone Encounter (Signed)
 Need new order for [facility] 3 plus sensor. NT unable to reorder.   Requested Prescriptions  Pending Prescriptions Disp Refills   Continuous Glucose Sensor (FREESTYLE LIBRE 3 SENSOR) MISC 2 each 4    Sig: Place 1 sensor on the skin every 14 days. Use to check glucose continuously.     Endocrinology: Diabetes - Testing Supplies Passed - 09/08/2023  3:04 PM      Passed - Valid encounter within last 12 months    Recent Outpatient Visits           1 month ago Annual physical exam   [facility], Amy J, NP   3 months ago Chronic back pain, unspecified back location, unspecified back pain laterality   [facility], Amy J, NP   10 months ago Uncontrolled type 2 diabetes mellitus with hyperglycemia [facility])   [facility], Amy J, NP   1 year ago Type 2 diabetes mellitus without complication, with long-term current use of insulin  [facility])   [facility] [place] Tonia Garnette LITTIE, RPH-CPP   1 year ago Dysphagia, unspecified type   [facility], Greig PARAS, NP

## 2023-09-09 ENCOUNTER — Other Ambulatory Visit: Payer: Self-pay

## 2023-09-09 MED ORDER — FREESTYLE LIBRE 3 SENSOR MISC
4 refills | Status: DC
  Filled 2023-09-09: qty 2, 28d supply, fill #0
  Filled 2023-11-25 – 2023-11-29 (×2): qty 2, 28d supply, fill #1

## 2023-09-15 ENCOUNTER — Other Ambulatory Visit: Payer: Self-pay

## 2023-09-17 ENCOUNTER — Other Ambulatory Visit: Payer: Self-pay

## 2023-09-17 ENCOUNTER — Ambulatory Visit (AMBULATORY_SURGERY_CENTER): Payer: MEDICAID

## 2023-09-17 VITALS — Ht 69.0 in | Wt 196.0 lb

## 2023-09-17 DIAGNOSIS — Z1211 Encounter for screening for malignant neoplasm of colon: Secondary | ICD-10-CM

## 2023-09-17 MED ORDER — NA SULFATE-K SULFATE-MG SULF 17.5-3.13-1.6 GM/177ML PO SOLN
1.0000 | Freq: Once | ORAL | 0 refills | Status: AC
  Filled 2023-09-17: qty 354, 2d supply, fill #0

## 2023-09-17 NOTE — Progress Notes (Signed)
 No issues known to pt with past sedation with any surgeries or procedures Patient denies ever being told they had issues or difficulty with intubation  No FH of Malignant Hyperthermia Pt is not on diet pills nor GLP-1 medications Pt is not on home 02  Pt is not on blood thinners  Pt denies issues with chronic constipation  No A fib or A flutter Have any cardiac testing pending--no Pt instructed to use [email or web address] or GoodRx for a price reduction on prep  Ambulates independently Patient stated understanding of all directions r/t his diabetic medications

## 2023-09-23 ENCOUNTER — Ambulatory Visit: Payer: MEDICAID | Admitting: Dietician

## 2023-09-28 ENCOUNTER — Other Ambulatory Visit: Payer: Self-pay

## 2023-09-29 ENCOUNTER — Other Ambulatory Visit: Payer: Self-pay

## 2023-10-06 ENCOUNTER — Encounter: Payer: Self-pay | Admitting: Gastroenterology

## 2023-10-06 ENCOUNTER — Ambulatory Visit: Payer: MEDICAID | Admitting: Gastroenterology

## 2023-10-06 VITALS — BP 131/77 | HR 80 | Temp 98.1°F | Resp 14 | Ht 69.0 in | Wt 196.0 lb

## 2023-10-06 DIAGNOSIS — Z1211 Encounter for screening for malignant neoplasm of colon: Secondary | ICD-10-CM

## 2023-10-06 DIAGNOSIS — D122 Benign neoplasm of ascending colon: Secondary | ICD-10-CM | POA: Diagnosis not present

## 2023-10-06 DIAGNOSIS — K648 Other hemorrhoids: Secondary | ICD-10-CM | POA: Diagnosis not present

## 2023-10-06 DIAGNOSIS — K5730 Diverticulosis of large intestine without perforation or abscess without bleeding: Secondary | ICD-10-CM | POA: Diagnosis not present

## 2023-10-06 MED ORDER — SODIUM CHLORIDE 0.9 % IV SOLN: 500.0000 mL | Freq: Once | INTRAVENOUS | Status: DC

## 2023-10-06 NOTE — Patient Instructions (Signed)

## 2023-10-06 NOTE — Progress Notes (Signed)
 Called to room to assist during endoscopic procedure.  Patient ID and intended procedure confirmed with present staff. Received instructions for my participation in the procedure from the performing physician.

## 2023-10-06 NOTE — Op Note (Signed)
 [facility] Patient Name: Collen Vincent Procedure Date: 10/06/2023 12:22 PM MRN: [id number] Endoscopist: Elspeth P. Leigh , MD, [id number] Age: 53 Referring MD:  Date of Birth: 1970-10-30 Gender: Male Account #: [id number] Procedure:                Colonoscopy Indications:              Screening for colorectal malignant neoplasm, This                            is the patient's first colonoscopy Medicines:                Monitored Anesthesia Care Procedure:                Pre-Anesthesia Assessment:                           - Prior to the procedure, a History and Physical                            was performed, and patient medications and                            allergies were reviewed. The patient's tolerance of                            previous anesthesia was also reviewed. The risks                            and benefits of the procedure and the sedation                            options and risks were discussed with the patient.                            All questions were answered, and informed consent                            was obtained. Prior Anticoagulants: The patient has                            taken no anticoagulant or antiplatelet agents. ASA                            Grade Assessment: III - A patient with severe                            systemic disease. After reviewing the risks and                            benefits, the patient was deemed in satisfactory                            condition to undergo the procedure.  After obtaining informed consent, the colonoscope                            was passed under direct vision. Throughout the                            procedure, the patient's blood pressure, pulse, and                            oxygen saturations were monitored continuously. The                            CF [id number] #[id number] was introduced through the anus                            and advanced  to the the cecum, identified by                            appendiceal orifice and ileocecal valve. The                            colonoscopy was performed without difficulty. The                            patient tolerated the procedure well. The quality                            of the bowel preparation was adequate. The                            ileocecal valve, appendiceal orifice, and rectum                            were photographed. Scope In: 12:28:21 PM Scope Out: 12:48:50 PM Scope Withdrawal Time: 0 hours 17 minutes 57 seconds  Total Procedure Duration: 0 hours 20 minutes 29 seconds  Findings:                 The perianal and digital rectal examinations were                            normal.                           A 3 to 4 mm polyp was found in the ascending colon.                            The polyp was sessile. The polyp was removed with a                            cold snare. Resection and retrieval were complete.                           A few small-mouthed diverticula were found in the  sigmoid colon.                           Internal hemorrhoids were found. The hemorrhoids                            were small.                           The exam was otherwise without abnormality. Of                            note, poor air retention in the rectum, retroflexed                            views there were limited. Complications:            No immediate complications. Estimated blood loss:                            Minimal. Estimated Blood Loss:     Estimated blood loss was minimal. Impression:               - One 3 to 4 mm polyp in the ascending colon,                            removed with a cold snare. Resected and retrieved.                           - Diverticulosis in the sigmoid colon.                           - Internal hemorrhoids.                           - The examination was otherwise normal. Recommendation:            - Patient has a contact number available for                            emergencies. The signs and symptoms of potential                            delayed complications were discussed with the                            patient. Return to normal activities tomorrow.                            Written discharge instructions were provided to the                            patient.                           - Resume previous diet.                           -  Continue present medications.                           - Await pathology results. Elspeth P. Ercel Normoyle, MD 10/06/2023 12:53:04 PM This report has been signed electronically.

## 2023-10-06 NOTE — Progress Notes (Signed)
 Pt's states no medical or surgical changes since previsit or office visit.

## 2023-10-06 NOTE — Progress Notes (Signed)
 To PACU via stretcher, sedated, good respiratory effort, VSS.

## 2023-10-06 NOTE — Progress Notes (Signed)
 [facility] History and Physical   Primary Care Physician:  Lorren Greig PARAS, NP   Reason for Procedure:   Colon cancer screening  Plan:    colonoscopy     HPI: [name] [name] is a 53 y.o. male  here for colonoscopy screening - 1st time exam.   Patient denies any bowel symptoms at this time. No family history of colon cancer known. Otherwise feels well without any cardiopulmonary symptoms.   I have discussed risks / benefits of anesthesia and endoscopic procedure with Bennie Dawn and they wish to proceed with the exams as outlined today.    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 Authorizing Provider  Accu-Chek Softclix Lancets lancets Testing 4 (four) times daily -  before meals and at bedtime. Use as directed. 06/15/22  Yes Lorren, Amy J, NP  Blood Glucose Monitoring Suppl (ACCU-CHEK GUIDE) w/Device KIT 1 each by Other route 4 (four) times daily -  before meals and at bedtime. Check blood sugar once daily in the morning before taking Lantus  06/15/22  Yes Lorren, Amy J, NP  cetirizine (ZYRTEC) 10 MG tablet Take 10 mg by mouth daily.   Yes [provider]  cloNIDine  (CATAPRES ) 0.1 MG tablet Take 1 tablet (0.1 mg total) by mouth in the morning AND 2 tablets (0.2 mg total) at bedtime. 12/09/22  Yes Izella Ismael NOVAK, MD  empagliflozin  (JARDIANCE ) 25 MG TABS tablet Take 1 tablet (25 mg total) by mouth daily before breakfast. 01/19/23  Yes Motwani, Komal, MD  glucose blood (ACCU-CHEK GUIDE) test strip Testing 4 (four) times daily -  before meals and at bedtime. Use as instructed 06/15/22  Yes Lorren,  Amy J, NP  insulin  glargine (LANTUS  SOLOSTAR) 100 UNIT/ML Solostar Pen Inject 28-30 Units into the skin at bedtime. 05/19/23  Yes Motwani, Komal, MD  insulin  lispro (HUMALOG  KWIKPEN) 100 UNIT/ML KwikPen Inject 14-20 Units into the skin 3 (three) times daily. 05/19/23 10/10/23 Yes Motwani, Komal, MD  Insulin  Pen Needle (PEN NEEDLES) 32G X 4 MM MISC USE AS DIRECTED in the morning, at noon, in the evening, and at bedtime. 09/07/23  Yes Motwani, Komal, MD  Insulin  Pen Needle (TECHLITE PEN NEEDLES) 32G X 4 MM MISC Use as directed daily. 06/19/21  Yes Lorren, Amy J, NP  rosuvastatin  (CRESTOR ) 10 MG tablet Take 1 tablet (10 mg total) by mouth daily. 02/03/23  Yes Motwani, Komal, MD  traMADol (ULTRAM) 50 MG tablet Take 50 mg by mouth every 6 (six) hours as needed. 08/18/22  Yes [provider]  Continuous Glucose Sensor (FREESTYLE LIBRE 3 SENSOR) MISC Place 1 sensor on the skin every 14 days. Use to check glucose continuously. 09/09/23   Lorren Greig PARAS, NP  omeprazole  (PRILOSEC) 20 MG capsule Take 1 capsule (20 mg total) by mouth daily. 07/15/23   Lorren Greig PARAS, NP  pregabalin  (LYRICA ) 50 MG capsule Take 1 capsule (50 mg total) by mouth 3 (three) times daily. 09/07/23   Motwani, Komal, MD    Current Outpatient Medications  Medication Sig Dispense Refill  Accu-Chek Softclix Lancets lancets Testing 4 (four) times daily -  before meals and at bedtime. Use as directed. 100 each 5   Blood Glucose Monitoring Suppl (ACCU-CHEK GUIDE) w/Device KIT 1 each by Other route 4 (four) times daily -  before meals and at bedtime. Check blood sugar once daily in the morning before taking Lantus  1 kit 0   cetirizine (ZYRTEC) 10 MG tablet Take 10 mg by mouth daily.     cloNIDine  (CATAPRES ) 0.1 MG tablet Take 1 tablet (0.1 mg total) by mouth in the morning AND 2 tablets (0.2 mg total) at bedtime. 90 tablet 1   empagliflozin  (JARDIANCE ) 25 MG TABS tablet Take 1 tablet (25 mg total) by mouth daily before breakfast. 90 tablet 1    glucose blood (ACCU-CHEK GUIDE) test strip Testing 4 (four) times daily -  before meals and at bedtime. Use as instructed 100 each 12   insulin  glargine (LANTUS  SOLOSTAR) 100 UNIT/ML Solostar Pen Inject 28-30 Units into the skin at bedtime. 27 mL 2   insulin  lispro (HUMALOG  KWIKPEN) 100 UNIT/ML KwikPen Inject 14-20 Units into the skin 3 (three) times daily. 54 mL 0   Insulin  Pen Needle (PEN NEEDLES) 32G X 4 MM MISC USE AS DIRECTED in the morning, at noon, in the evening, and at bedtime. 100 each 4   Insulin  Pen Needle (TECHLITE PEN NEEDLES) 32G X 4 MM MISC Use as directed daily. 100 each 0   rosuvastatin  (CRESTOR ) 10 MG tablet Take 1 tablet (10 mg total) by mouth daily. 90 tablet 3   traMADol (ULTRAM) 50 MG tablet Take 50 mg by mouth every 6 (six) hours as needed.     Continuous Glucose Sensor (FREESTYLE LIBRE 3 SENSOR) MISC Place 1 sensor on the skin every 14 days. Use to check glucose continuously. 2 each 4   omeprazole  (PRILOSEC) 20 MG capsule Take 1 capsule (20 mg total) by mouth daily. 90 capsule 0   pregabalin  (LYRICA ) 50 MG capsule Take 1 capsule (50 mg total) by mouth 3 (three) times daily. 90 capsule 3   Current Facility-Administered Medications  Medication Dose Route Frequency Provider Last Rate Last Admin   0.9 %  sodium chloride  infusion  500 mL Intravenous Once Chemere Steffler, Elspeth SQUIBB, MD        Allergies as of 10/06/2023   (No Known Allergies)    Family History  Problem Relation Age of Onset   Stomach cancer Paternal Aunt    Diabetes Paternal Grandmother    Diabetes Paternal Grandfather    Stroke Paternal Grandfather    Colon cancer Neg Hx    Colon polyps Neg Hx    Esophageal cancer Neg Hx    Rectal cancer Neg Hx     Social History   Socioeconomic History   Marital status: Single    Spouse name: Not on file   Number of children: Not on file   Years of education: Not on file   Highest education level: GED or equivalent  Occupational History   Not on file  Tobacco  Use   Smoking status: Never    Passive exposure: Current   Smokeless tobacco: Never  Vaping Use   Vaping status: Never Used  Substance and Sexual Activity   Alcohol use: Never   Drug use: Never   Sexual activity: Yes  Other Topics Concern   Not on file  Social History Narrative   Not on file   Social Drivers of Health   Financial Resource Strain: High Risk (  07/22/2023)   Overall Financial Resource Strain (CARDIA)    Difficulty of Paying Living Expenses: Hard  Food Insecurity: Food Insecurity Present (07/22/2023)   Hunger Vital Sign    Worried About Running Out of Food in the Last Year: Often true    Ran Out of Food in the Last Year: Often true  Transportation Needs: Unmet Transportation Needs (07/22/2023)   PRAPARE - Administrator, Civil Service (Medical): Yes    Lack of Transportation (Non-Medical): Yes  Physical Activity: Inactive (11/25/2022)   Exercise Vital Sign    Days of Exercise per Week: 0 days    Minutes of Exercise per Session: 0 min  Stress: Stress Concern Present (11/25/2022)   [employer] of Occupational Health - Occupational Stress Questionnaire    Feeling of Stress : Very much  Social Connections: Moderately Integrated (11/25/2022)   Social Connection and Isolation Panel    Frequency of Communication with Friends and Family: More than three times a week    Frequency of Social Gatherings with Friends and Family: Never    Attends Religious Services: 1 to 4 times per year    Active Member of [employer] or Organizations: No    Attends Banker Meetings: Never    Marital Status: Living with partner  Intimate Partner Violence: Not At Risk (11/25/2022)   Humiliation, Afraid, Rape, and Kick questionnaire    Fear of Current or Ex-Partner: No    Emotionally Abused: No    Physically Abused: No    Sexually Abused: No    Review of Systems: All other review of systems negative except as mentioned in the HPI.  Physical Exam: Vital  signs BP 125/77   Pulse 86   Temp 98.1 F (36.7 C)   Ht 5' 9 (1.753 m)   Wt 196 lb (88.9 kg)   SpO2 99%   BMI 28.94 kg/m   General:   Alert,  Well-developed, pleasant and cooperative in NAD Lungs:  Clear throughout to auscultation.   Heart:  Regular rate and rhythm Abdomen:  Soft, nontender and nondistended.   Neuro/Psych:  Alert and cooperative. Normal mood and affect. A and O x 3  Marcey Naval, MD [facility]

## 2023-10-07 ENCOUNTER — Telehealth: Payer: Self-pay

## 2023-10-07 NOTE — Telephone Encounter (Signed)
  Follow up Call-     10/06/2023   11:10 AM  Call back number  Post procedure Call Back phone  # [phone]  Permission to leave phone message Yes     Patient questions:  Do you have a fever, pain , or abdominal swelling? No. Pain Score  0 *  Have you tolerated food without any problems? Yes.    Have you been able to return to your normal activities? Yes.    Do you have any questions about your discharge instructions: Diet   No. Medications  No. Follow up visit  No.  Do you have questions or concerns about your Care? No.  Actions: * If pain score is 4 or above: No action needed, pain <4.

## 2023-10-08 LAB — SURGICAL PATHOLOGY

## 2023-10-11 ENCOUNTER — Ambulatory Visit: Payer: Self-pay | Admitting: Gastroenterology

## 2023-11-01 ENCOUNTER — Ambulatory Visit: Payer: Self-pay | Admitting: Family

## 2023-11-01 NOTE — Telephone Encounter (Signed)
 FYI Only or Action Required?: FYI only for provider.  Patient was last seen in primary care on 07/15/2023 by Lorren Greig PARAS, NP.  Called Nurse Triage reporting Abdominal Pain.  Symptoms began several weeks ago.  Triage Disposition: See Within 3 Days in Office (overriding See PCP Within 2 Weeks)  Patient/caregiver understands and will follow disposition?: Yes                 Copied from CRM #[id number]. Topic: Clinical - Red Word Triage >> Nov 01, 2023  4:17 PM Fonda T wrote: Red Word that prompted transfer to Nurse Triage: Patient is calling states he is having increased numbness, tingling and pain. Feels like pinching sensation radiates from side to side. Patient also reports having stomach camping at times, causing pain, similar to a muscle spasm.   Patient states it has been getting progressively worse within past 2 weeks.   Patient would also like to have  STI screening. Reason for Disposition  Abdominal pain is a chronic symptom (recurrent or ongoing AND present > 4 weeks)  Answer Assessment - Initial Assessment Questions Pt scheduled for first available appt at [facility]. Pt aware of location. This RN educated pt on new-worsening symptoms and when to call back. Pt verbalized understanding and agrees to plan. Pt states he would like to get an STD test as well. Pt denies any symptoms related to this.   Abdominal pain that feels like a muscle spasm started x2 weeks Abdomen sides are sore, pinchy feeling' x1 month Hands and feet tingling is getting worse, sharp pains in hands and feet at times; pt states he has neuropathy  Protocols used: Abdominal Pain - Male-A-AH

## 2023-11-02 ENCOUNTER — Ambulatory Visit: Payer: Self-pay | Admitting: *Deleted

## 2023-11-02 NOTE — Telephone Encounter (Signed)
 I called patient and he has everything taking care of for his transportation tomorrow

## 2023-11-02 NOTE — Telephone Encounter (Signed)
 See NT encounter   FYI Only or Action Required?: FYI only for provider.  Patient was last seen in primary care on 07/15/2023 by Lorren Greig PARAS, NP.  Called Nurse Triage reporting Advice Only.  Symptoms began FYI Only or Action Required?: FYI only for provider.  Patient was last seen in primary care on 07/15/2023 by Lorren Greig PARAS, NP.  Called Nurse Triage reporting Advice Only.  Symptoms began na.  Interventions attempted: Other: na.  Symptoms are: na.  Triage Disposition: Call PCP When Office is Open  Patient/caregiver understands and will follow disposition?: Yes.  Interventions attempted: Other: na.  Symptoms are: na.  Triage Disposition: Call PCP When Office is Open  Patient/caregiver understands and will follow disposition?: Yes    Copied from CRM #[id number]. Topic: General - Other >> Nov 02, 2023 11:27 AM Gustabo BIRCH wrote: Curtistine from [facility] needs to confirm if appt is urgent Reason for Disposition  [1] Caller requesting NON-URGENT health information AND [2] PCP's office is the best resource  Answer Assessment - Initial Assessment Questions Reviewed with caller Curtistine  from [facility], patient ride needs to be labeled as urgent due to initial NT encounter was on 11/01/23 and appt was scheduled for tomorrow by office and PCP recommended if sx worsen go to UC /ED or call 911 for assist.  HIPAA precautions followed and not other information regarding appt was shared with caller.      1. REASON FOR CALL: What is the main reason for your call? or How can I best help you?     Curtistine from [facility] care requesting if appt is urgent  or if transportation ride is needs to be scheduled as urgent, due to transportation needs for appt need to be given 2 business days to schedule a ride.  2. SYMPTOMS : Do you have any symptoms?      Message from 11/02/23 provider reports if abdominal pain worsens go to UC/ED.   3. OTHER QUESTIONS: Do you have any other  questions?     No  Protocols used: Information Only Call - No Triage-A-AH

## 2023-11-02 NOTE — Telephone Encounter (Signed)
 I called patient and made him aware of pcp recommendations he has an appointment tomorrow

## 2023-11-02 NOTE — Telephone Encounter (Signed)
 Report to Emergency Department/Urgent Care/call 911 for immediate medical evaluation. Follow-up with Primary Care.

## 2023-11-03 ENCOUNTER — Ambulatory Visit (INDEPENDENT_AMBULATORY_CARE_PROVIDER_SITE_OTHER): Payer: MEDICAID | Admitting: Nurse Practitioner

## 2023-11-03 ENCOUNTER — Encounter: Payer: Self-pay | Admitting: Nurse Practitioner

## 2023-11-03 VITALS — BP 131/79 | HR 76 | Ht 69.0 in | Wt 204.8 lb

## 2023-11-03 DIAGNOSIS — Z794 Long term (current) use of insulin: Secondary | ICD-10-CM

## 2023-11-03 DIAGNOSIS — Z113 Encounter for screening for infections with a predominantly sexual mode of transmission: Secondary | ICD-10-CM | POA: Diagnosis not present

## 2023-11-03 DIAGNOSIS — E119 Type 2 diabetes mellitus without complications: Secondary | ICD-10-CM | POA: Diagnosis not present

## 2023-11-03 DIAGNOSIS — M79671 Pain in right foot: Secondary | ICD-10-CM | POA: Diagnosis not present

## 2023-11-03 DIAGNOSIS — M79672 Pain in left foot: Secondary | ICD-10-CM

## 2023-11-03 NOTE — Progress Notes (Signed)
 Subjective   Patient ID: [name] [name], male    DOB: July 20, 1970, 53 y.o.   MRN: [id number]  Chief Complaint  Patient presents with   Abdominal Pain    Muscle spasm in the abdomen   Labs Only    Would like to have labs done kidneys and liver due to pain in the sides, also would like to be tested for STD    Referring provider: Lorren Greig PARAS, NP  [name] [name] is a 53 y.o. male with Past Medical History: No date: Depression No date: Diabetes mellitus without complication (HCC) No date: Diabetes mellitus, type II (HCC) No date: Heart murmur No date: Hyperlipidemia No date: PTSD (post-traumatic stress disorder) No date: Tuberculosis     Comment:  carrier per pt   HPI  Patient presents today for an acute visit.  He states that he had a cramping pain in his lower abdomen 2 weeks ago.  He has been having some intermittent pain to his lower abdomen.  He would like STD check today.  We will check labs today. Denies f/c/s, n/v/d, hemoptysis, PND, leg swelling Denies chest pain or edema  Note: Patient is also requesting referral to podiatry for bilateral foot pain.      No Known Allergies   There is no immunization history on file for this patient.  Tobacco History: Social History   Tobacco Use  Smoking Status Never   Passive exposure: Current  Smokeless Tobacco Never   Counseling given: Not Answered   Outpatient Encounter Medications as of 11/03/2023  Medication Sig   Accu-Chek Softclix Lancets lancets Testing 4 (four) times daily -  before meals and at bedtime. Use as directed.   Blood Glucose Monitoring Suppl (ACCU-CHEK GUIDE) w/Device KIT 1 each by Other route 4 (four) times daily -  before meals and at bedtime. Check blood sugar once daily in the morning before taking Lantus    cetirizine (ZYRTEC) 10 MG tablet Take 10 mg by mouth daily.   cloNIDine  (CATAPRES ) 0.1 MG tablet Take 1 tablet (0.1 mg total) by mouth in the morning AND 2 tablets (0.2 mg total) at  bedtime.   Continuous Glucose Sensor (FREESTYLE LIBRE 3 SENSOR) MISC Place 1 sensor on the skin every 14 days. Use to check glucose continuously.   empagliflozin  (JARDIANCE ) 25 MG TABS tablet Take 1 tablet (25 mg total) by mouth daily before breakfast.   glucose blood (ACCU-CHEK GUIDE) test strip Testing 4 (four) times daily -  before meals and at bedtime. Use as instructed   insulin  glargine (LANTUS  SOLOSTAR) 100 UNIT/ML Solostar Pen Inject 28-30 Units into the skin at bedtime.   insulin  lispro (HUMALOG  KWIKPEN) 100 UNIT/ML KwikPen Inject 14-20 Units into the skin 3 (three) times daily.   Insulin  Pen Needle (PEN NEEDLES) 32G X 4 MM MISC USE AS DIRECTED in the morning, at noon, in the evening, and at bedtime.   Insulin  Pen Needle (TECHLITE PEN NEEDLES) 32G X 4 MM MISC Use as directed daily.   omeprazole  (PRILOSEC) 20 MG capsule Take 1 capsule (20 mg total) by mouth daily.   polyethylene glycol (MIRALAX / GLYCOLAX) 17 g packet Take 17 g by mouth daily. Prn   pregabalin  (LYRICA ) 50 MG capsule Take 1 capsule (50 mg total) by mouth 3 (three) times daily.   rosuvastatin  (CRESTOR ) 10 MG tablet Take 1 tablet (10 mg total) by mouth daily.   traMADol (ULTRAM) 50 MG tablet Take 50 mg by mouth every 6 (six) hours as needed.  No facility-administered encounter medications on file as of 11/03/2023.    Review of Systems  Review of Systems  HENT: Negative.    Gastrointestinal:  Positive for abdominal pain.     Objective:   BP 131/79 (BP Location: Left Arm, Patient Position: Sitting, Cuff Size: Large)   Pulse 76   Ht 5' 9 (1.753 m)   Wt 204 lb 12.8 oz (92.9 kg)   SpO2 98%   BMI 30.24 kg/m   Wt Readings from Last 5 Encounters:  11/03/23 204 lb 12.8 oz (92.9 kg)  10/06/23 196 lb (88.9 kg)  09/17/23 196 lb (88.9 kg)  09/07/23 198 lb (89.8 kg)  08/17/23 200 lb (90.7 kg)     Physical Exam Vitals and nursing note reviewed.  Constitutional:      General: He is not in acute distress.     Appearance: He is well-developed.  Cardiovascular:     Rate and Rhythm: Regular rhythm.  Pulmonary:     Effort: Pulmonary effort is normal.     Breath sounds: Normal breath sounds.  Abdominal:     Tenderness: There is abdominal tenderness in the right lower quadrant and left lower quadrant.  Skin:    General: Skin is warm and dry.  Neurological:     Mental Status: He is alert and oriented to person, place, and time.       Assessment & Plan:   Screen for STD (sexually transmitted disease) -     RPR+HIV+GC+CT Panel -     Chlamydia/Gonococcus/Trichomonas, NAA  Type 2 diabetes mellitus without complication, with long-term current use of insulin  (HCC) -     CBC -     Comprehensive metabolic panel with GFR  Bilateral foot pain -     Ambulatory referral to Podiatry     Return in about 6 months (around 05/03/2024).   Bascom GORMAN Borer, NP 11/03/2023

## 2023-11-04 LAB — CBC
Hematocrit: 49.8 % (ref 37.5–51.0)
Hemoglobin: 16.4 g/dL (ref 13.0–17.7)
MCH: 29.2 pg (ref 26.6–33.0)
MCHC: 32.9 g/dL (ref 31.5–35.7)
MCV: 89 fL (ref 79–97)
Platelets: 193 10*3/uL (ref 150–450)
RBC: 5.62 x10E6/uL (ref 4.14–5.80)
RDW: 13.8 % (ref 11.6–15.4)
WBC: 4.5 10*3/uL (ref 3.4–10.8)

## 2023-11-04 LAB — COMPREHENSIVE METABOLIC PANEL WITH GFR
ALT: 22 [IU]/L (ref 0–44)
AST: 16 [IU]/L (ref 0–40)
Albumin: 4.6 g/dL (ref 3.8–4.9)
Alkaline Phosphatase: 67 [IU]/L (ref 47–123)
BUN/Creatinine Ratio: 12 (ref 9–20)
BUN: 13 mg/dL (ref 6–24)
Bilirubin Total: 0.4 mg/dL (ref 0.0–1.2)
CO2: 24 mmol/L (ref 20–29)
Calcium: 9.6 mg/dL (ref 8.7–10.2)
Chloride: 103 mmol/L (ref 96–106)
Creatinine, Ser: 1.05 mg/dL (ref 0.76–1.27)
Globulin, Total: 3.1 g/dL (ref 1.5–4.5)
Glucose: 221 mg/dL — ABNORMAL HIGH (ref 70–99)
Potassium: 4.4 mmol/L (ref 3.5–5.2)
Sodium: 141 mmol/L (ref 134–144)
Total Protein: 7.7 g/dL (ref 6.0–8.5)
eGFR: 85 mL/min/{1.73_m2}

## 2023-11-05 ENCOUNTER — Ambulatory Visit: Payer: Self-pay | Admitting: Nurse Practitioner

## 2023-11-05 LAB — RPR+HIV+GC+CT PANEL
HIV Screen 4th Generation wRfx: NONREACTIVE
RPR Ser Ql: NONREACTIVE

## 2023-11-05 LAB — CHLAMYDIA/GONOCOCCUS/TRICHOMONAS, NAA
Chlamydia by NAA: NEGATIVE
Gonococcus by NAA: NEGATIVE
Trich vag by NAA: NEGATIVE

## 2023-11-11 ENCOUNTER — Other Ambulatory Visit: Payer: Self-pay

## 2023-11-11 ENCOUNTER — Ambulatory Visit: Payer: MEDICAID

## 2023-11-11 DIAGNOSIS — Z0189 Encounter for other specified special examinations: Secondary | ICD-10-CM

## 2023-11-11 DIAGNOSIS — E119 Type 2 diabetes mellitus without complications: Secondary | ICD-10-CM

## 2023-11-11 DIAGNOSIS — B353 Tinea pedis: Secondary | ICD-10-CM

## 2023-11-11 DIAGNOSIS — M722 Plantar fascial fibromatosis: Secondary | ICD-10-CM | POA: Diagnosis not present

## 2023-11-11 MED ORDER — TERBINAFINE HCL 250 MG PO TABS
250.0000 mg | ORAL_TABLET | Freq: Every day | ORAL | 0 refills | Status: AC
  Filled 2023-11-11: qty 30, 30d supply, fill #0

## 2023-11-11 NOTE — Progress Notes (Addendum)
Subjective:  Patient ID: [name] [name], male    DOB: 06/08/1970,  MRN: [id number]  53 y.o. male presents with chief concern of diabetes with elongated, thickened, painful, discolored toenails for months. Aggravating factor(s) include palpation. Patient has tried self attempt at trimming toenail. Patient has tried none. He does complain of peeling skin with occasional itching to bilateral feet.   Chief Complaint  Patient presents with   Peripheral Neuropathy    Rm24 Patient complains numbness, tingling and sharp pains/ taking lyrica  50 just started 1 month ago/ pt also has concerns of nail fungus.A1C 9    PCP is Lorren Greig PARAS, NP   No Known Allergies  Review of Systems: Negative except as noted in the HPI.   Objective:  [name] [name] is a pleasant 53 y.o. male in NAD. AAO x 3.  Vascular Examination: Vascular status intact b/l with palpable pedal pulses. CFT immediate b/l. Pedal hair present. No edema. No pain with calf compression b/l. Skin temperature gradient WNL b/l. No varicosities noted. No cyanosis or clubbing noted.  Neurological Examination: Sensation grossly absent b/l with 10 gram monofilament. Negative tinel sign at tarsal tunnel bilaterally.   Dermatological Examination: Pedal skin with normal turgor, texture and tone b/l. No open wounds nor interdigital macerations noted. Toenails 1-5 b/l thick, discolored, elongated with subungual debris and pain on dorsal palpation.   Musculoskeletal Examination: Muscle strength 5/5 to b/l LE.  No pain, crepitus noted b/l. No gross pedal deformities. Patient ambulates independently without assistive aids. Palpable soft tissue mass plantar aspect right foot without pain to palpation. No wound.   Radiographs: None Last A1c:      Latest Ref Rng & Units 09/07/2023    9:22 AM 05/19/2023    1:59 PM 01/12/2023    8:31 AM 01/04/2023    9:52 AM  Hemoglobin A1C  Hemoglobin-A1c 4.0 - 5.6 % 9.0  9.2  13.5  CANCELED      Assessment:   1.  Encounter for diabetic foot exam (HCC)   2. Type 2 diabetes mellitus without complication, without long-term current use of insulin  (HCC)   3. Tinea pedis of both feet   4. Plantar fascial fibromatosis     Plan:  Consent given for treatment. Diabetic foot examination performed today. All patient's and/or POA's questions/concerns addressed on today's visit. Toenails 1-5 b/l debrided in length and girth without incident. Continue foot and shoe inspections daily. Monitor blood glucose per PCP/Endocrinologist's recommendations. Continue soft, supportive shoe gear daily. Report any pedal injuries to medical professional. Call office if there are any questions/concerns. -Patient/POA to call should there be question/concern in the interim. - 30 day prescription for terbinafine sent to patient's pharmacy for treatment of tinea pedis. Normal Ast/alt levels.  - No pain from plantar fibroma, no intervention planned at this time.   RTC 3 months with Dr. Loreda, DPM  Prentice Ovens, DPM AACFAS [facility] Tra[facility] Podiatric Surgeon [facility]       [place] LOCATION: [address] [place] [address]                   Office [phone]   [place] LOCATION: [address] [place], [place] [address] Office [phone])  [phone]  

## 2023-11-15 ENCOUNTER — Encounter: Payer: Self-pay | Admitting: Radiology

## 2023-11-15 ENCOUNTER — Other Ambulatory Visit (HOSPITAL_COMMUNITY): Payer: Self-pay | Admitting: "Endocrinology

## 2023-11-15 ENCOUNTER — Other Ambulatory Visit: Payer: Self-pay

## 2023-11-15 ENCOUNTER — Other Ambulatory Visit: Payer: MEDICAID

## 2023-11-15 ENCOUNTER — Other Ambulatory Visit (HOSPITAL_COMMUNITY): Payer: Self-pay | Admitting: Family

## 2023-11-15 ENCOUNTER — Other Ambulatory Visit: Payer: Self-pay | Admitting: "Endocrinology

## 2023-11-15 DIAGNOSIS — E1141 Type 2 diabetes mellitus with diabetic mononeuropathy: Secondary | ICD-10-CM

## 2023-11-16 LAB — LIPID PANEL
Cholesterol: 167 mg/dL (ref <200)
HDL: 47 mg/dL (ref >=40)
LDL Cholesterol (Calc): 99 mg/dL
Non-HDL Cholesterol (Calc): 120 mg/dL (ref <130)
Total CHOL/HDL Ratio: 3.6 (calc) (ref <5.0)
Triglycerides: 118 mg/dL (ref <150)

## 2023-11-16 LAB — COMPREHENSIVE METABOLIC PANEL WITH GFR
AG Ratio: 1.4 (calc) (ref 1.0–2.5)
ALT: 19 U/L (ref 9–46)
AST: 16 U/L (ref 10–35)
Albumin: 4.5 g/dL (ref 3.6–5.1)
Alkaline phosphatase (APISO): 52 U/L (ref 35–144)
BUN: 16 mg/dL (ref 7–25)
CO2: 28 mmol/L (ref 20–32)
Calcium: 9.6 mg/dL (ref 8.6–10.3)
Chloride: 103 mmol/L (ref 98–110)
Creat: 1.16 mg/dL (ref 0.70–1.30)
Globulin: 3.2 g/dL (ref 1.9–3.7)
Glucose, Bld: 167 mg/dL — ABNORMAL HIGH (ref 65–99)
Potassium: 4.1 mmol/L (ref 3.5–5.3)
Sodium: 138 mmol/L (ref 135–146)
Total Bilirubin: 0.6 mg/dL (ref 0.2–1.2)
Total Protein: 7.7 g/dL (ref 6.1–8.1)
eGFR: 75 mL/min/1.73m2 (ref >=60)

## 2023-11-16 LAB — MICROALBUMIN / CREATININE URINE RATIO
Creatinine, Urine: 55 mg/dL (ref 20–320)
Microalb Creat Ratio: 16 mg/g{creat} (ref <30)
Microalb, Ur: 0.9 mg/dL

## 2023-11-16 NOTE — Telephone Encounter (Signed)
 Schedule appointment. Clonidine  last prescribed 12/09/2022 from Izella Ismael NOVAK, MD. During the interim report to the Emergency Department/Urgent Care/call 911 for immediate medical evaluation.

## 2023-11-16 NOTE — Telephone Encounter (Signed)
 Pt scheduled

## 2023-11-18 ENCOUNTER — Encounter: Payer: Self-pay | Admitting: "Endocrinology

## 2023-11-18 ENCOUNTER — Ambulatory Visit (INDEPENDENT_AMBULATORY_CARE_PROVIDER_SITE_OTHER): Payer: MEDICAID | Admitting: "Endocrinology

## 2023-11-18 ENCOUNTER — Other Ambulatory Visit: Payer: Self-pay

## 2023-11-18 VITALS — BP 100/80 | HR 84 | Ht 69.0 in | Wt 204.0 lb

## 2023-11-18 DIAGNOSIS — Z7984 Long term (current) use of oral hypoglycemic drugs: Secondary | ICD-10-CM

## 2023-11-18 DIAGNOSIS — Z794 Long term (current) use of insulin: Secondary | ICD-10-CM

## 2023-11-18 DIAGNOSIS — E1165 Type 2 diabetes mellitus with hyperglycemia: Secondary | ICD-10-CM

## 2023-11-18 DIAGNOSIS — E7800 Pure hypercholesterolemia, unspecified: Secondary | ICD-10-CM

## 2023-11-18 LAB — POCT GLYCOSYLATED HEMOGLOBIN (HGB A1C): Hemoglobin A1C: 9.8 % — AB (ref 4.0–5.6)

## 2023-11-18 MED ORDER — FREESTYLE LIBRE 3 PLUS SENSOR MISC
3 refills | Status: DC
  Filled 2023-11-18: qty 2, 30d supply, fill #0

## 2023-11-18 MED ORDER — PREGABALIN 50 MG PO CAPS
50.0000 mg | ORAL_CAPSULE | Freq: Three times a day (TID) | ORAL | 5 refills | Status: AC
  Filled 2023-11-18: qty 90, 30d supply, fill #0

## 2023-11-18 NOTE — Patient Instructions (Addendum)
 Recommend:  Jardiance  25 mg qam  Lantus  34 units once every night Humalog  15 units for break fast, 15 units for lunch and 15 units for supper meals, three times a day 15 min before each meal  Humalog  Correction scale: Use in addition to your meal time/short acting insulin  based on blood sugars as follows:  151 - 180: 1 unit 181 - 210: 2 units 211 - 240: 3 units 241 - 270: 4 units 271 - 300: 5 units 301 - 330: 6 units 331 - 360: 7 units 361 - 390: 8 units 391 - 420: 9 units  ___________    Goals of DM therapy:  Morning Fasting blood sugar: 80-140  Blood sugar before meals: 80-140 Bed time blood sugar: 100-150  A1C <7%, limited only by hypoglycemia  1.Diabetes medications and their side effects discussed, including hypoglycemia    2. Check blood glucose:  a) Always check blood sugars before driving. Please see below (under hypoglycemia) on how to manage b) Check a minimum of 3 times/day or more as needed when having symptoms of hypoglycemia.   c) Try to check blood glucose before sleeping/in the middle of the night to ensure that it is remaining stable and not dropping less than 100 d) Check blood glucose more often if sick  3. Diet: a) 3 meals per day schedule b: Restrict carbs to 60-70 grams (4 servings) per meal c) Colorful vegetables - 3 servings a day, and low sugar fruit 2 servings/day Plate control method: 1/4 plate protein, 1/4 starch, 1/2 green, yellow, or red vegetables d) Avoid carbohydrate snacks unless hypoglycemic episode, or increased physical activity  4. Regular exercise as tolerated, preferably 3 or more hours a week  5. Hypoglycemia: a)  Do not drive or operate machinery without first testing blood glucose to assure it is over 90 mg%, or if dizzy, lightheaded, not feeling normal, etc, or  if foot or leg is numb or weak. b)  If blood glucose less than 70, take four 5gm Glucose tabs or 15-30 gm Glucose gel.  Repeat every 15 min as needed until blood  sugar is >100 mg/dl. If hypoglycemia persists then call 911.   6. Sick day management: a) Check blood glucose more often b) Continue usual therapy if blood sugars are elevated.   7. Contact the doctor immediately if blood glucose is frequently <60 mg/dl, or an episode of severe hypoglycemia occurs (where someone had to give you glucose/  glucagon or if you passed out from a low blood glucose), or if blood glucose is persistently >350 mg/dl, for further management  8. A change in level of physical activity or exercise and a change in diet may also affect your blood sugar. Check blood sugars more often and call if needed.  Instructions: 1. Bring glucose meter, blood glucose records on every visit for review 2. Continue to follow up with primary care physician and other providers for medical care 3. Yearly eye  and foot exam 4. Please get blood work done prior to the next appointment

## 2023-11-18 NOTE — Progress Notes (Signed)
 Outpatient Endocrinology Note [name], MD  11/18/23   [name] [name] 11/10/1970 [id number]  Referring Provider: Jaycee Greig PARAS, NP Primary Care Provider: Jaycee Greig PARAS, NP Reason for consultation: Subjective   Assessment & Plan  Diagnoses and all orders for this visit:  Uncontrolled type 2 diabetes mellitus with hyperglycemia (HCC) -     POCT glycosylated hemoglobin (Hb A1C)  Long term (current) use of oral hypoglycemic drugs  Long-term insulin  use (HCC)  Insulin  dose changed (HCC)  Pure hypercholesterolemia  Other orders -     Continuous Glucose Sensor (FREESTYLE LIBRE 3 PLUS SENSOR) MISC; Change sensor every 15 days. -     pregabalin  (LYRICA ) 50 MG capsule; Take 1 capsule (50 mg total) by mouth 3 (three) times daily.   Diabetes Type II complicated by neuropathy, No results found for: GFR Hba1c goal less than 7, current Hba1c is 13.5 Lab Results  Component Value Date   HGBA1C 9.8 (A) 11/18/2023   Ordered DM education previously  Recommend:  Jardiance  25 mg qam  Lantus  34 units once every night Humalog  15 units for break fast, 15 units for lunch and 15 units for supper meals, three times a day 15 min before each meal  Humalog  Correction scale: Use in addition to your meal time/short acting insulin  based on blood sugars as follows:  151 - 180: 1 unit 181 - 210: 2 units 211 - 240: 3 units 241 - 270: 4 units 271 - 300: 5 units 301 - 330: 6 units 331 - 360: 7 units 361 - 390: 8 units 391 - 420: 9 units  No known contraindications/side effects to any of above medications Has libre 3 sensor   09/07/23 Stop by provider previously as it was not helping 50%, was put on Cymbalta  but patient reports side effects and continued pain. Instructed to wean off (take every other day) and eventually stop Cymbalta  while starting Lyrica  50 mg 3 times a day see if it helps better.  Instructed to message/call if needs Cymbalta  back due to side effects post stopping.   Patient understands and agreed  -Last LD and Tg are as follows: Lab Results  Component Value Date   LDLCALC 99 11/15/2023    Lab Results  Component Value Date   TRIG 118 11/15/2023   -02/03/23: started rosuvastatin  10 mg every day (pt really started taking it around 09/2023) -Follow low fat diet and exercise   -Blood pressure goal <140/90 - Microalbumin/creatinine goal is < 30 -Last MA/Cr is as follows: Lab Results  Component Value Date   MICROALBUR 0.9 11/15/2023   -not on ACE/ARB  -diet changes including salt restriction -limit eating outside -counseled BP targets per standards of diabetes care -uncontrolled blood pressure can lead to retinopathy, nephropathy and cardiovascular and atherosclerotic heart disease  Reviewed and counseled on: -A1C target -Blood sugar targets -Complications of uncontrolled diabetes  -Checking blood sugar before meals and bedtime and bring log next visit -All medications with mechanism of action and side effects -Hypoglycemia management: rule of 15's, Glucagon Emergency Kit and medical alert ID -low-carb low-fat plate-method diet -At least 20 minutes of physical activity per day -Annual dilated retinal eye exam and foot exam -compliance and follow up needs -follow up as scheduled or earlier if problem gets worse  Call if blood sugar is less than 70 or consistently above 250    Take a 15 gm snack of carbohydrate at bedtime before you go to sleep if your blood sugar is less  than 100.    If you are going to fast after midnight for a test or procedure, ask your physician for instructions on how to reduce/decrease your insulin  dose.    Call if blood sugar is less than 70 or consistently above 250  -Treating a low sugar by rule of 15  (15 gms of sugar every 15 min until sugar is more than 70) If you feel your sugar is low, test your sugar to be sure If your sugar is low (less than 70), then take 15 grams of a fast acting Carbohydrate (3-4  glucose tablets or glucose gel or 4 ounces of juice or regular soda) Recheck your sugar 15 min after treating low to make sure it is more than 70 If sugar is still less than 70, treat again with 15 grams of carbohydrate          Don't drive the hour of hypoglycemia  If unconscious/unable to eat or drink by mouth, use glucagon injection or nasal spray baqsimi and call 911. Can repeat again in 15 min if still unconscious.  Return in about 6 weeks (around 12/30/2023).   I have reviewed current medications, nurse's notes, allergies, vital signs, past medical and surgical history, family medical history, and social history for this encounter. Counseled patient on symptoms, examination findings, lab findings, imaging results, treatment decisions and monitoring and prognosis. The patient understood the recommendations and agrees with the treatment plan. All questions regarding treatment plan were fully answered.  [name], MD  11/18/23   History of Present Illness Stefano Mundt is a 53 y.o. year old male who presents for follow up of Type II diabetes mellitus.  Nechemia Rhine was first diagnosed in 2020.   Diabetes education +  Home diabetes regimen: Jardiance  25 mg qam  Lantus  30 units once every night Humalog  14 units for break fast, 14 units for lunch and 14 units for supper meals, three times a day 15 min before each meal  Humalog  Correction scale: Use in addition to your meal time/short acting insulin  based on blood sugars as follows:  151 - 180: 1 unit 181 - 210: 2 units 211 - 240: 3 units 241 - 270: 4 units 271 - 300: 5 units 301 - 330: 6 units 331 - 360: 7 units 361 - 390: 8 units 391 - 420: 9 units  COMPLICATIONS -  MI/Stroke -  retinopathy +  neuropathy -  nephropathy  BLOOD SUGAR DATA No CGM on Saw recent CGM data on pt's phone Range 251-269  Physical Exam  BP 100/80   Pulse 84   Ht 5' 9 (1.753 m)   Wt 204 lb (92.5 kg)   SpO2 96%   BMI 30.13 kg/m     Constitutional: well developed, well nourished Head: normocephalic, atraumatic Eyes: sclera anicteric, no redness Neck: supple Lungs: normal respiratory effort Neurology: alert and oriented Skin: dry, no appreciable rashes Musculoskeletal: no appreciable defects Psychiatric: normal mood and affect Diabetic Foot Exam - Simple   No data filed    Current Medications Patient's Medications  New Prescriptions   CONTINUOUS GLUCOSE SENSOR (FREESTYLE LIBRE 3 PLUS SENSOR) MISC    Change sensor every 15 days.  Previous Medications   ACCU-CHEK SOFTCLIX LANCETS LANCETS    Testing 4 (four) times daily -  before meals and at bedtime. Use as directed.   BLOOD GLUCOSE MONITORING SUPPL (ACCU-CHEK GUIDE) W/DEVICE KIT    1 each by Other route 4 (four) times daily -  before meals  and at bedtime. Check blood sugar once daily in the morning before taking Lantus    CETIRIZINE (ZYRTEC) 10 MG TABLET    Take 10 mg by mouth daily.   CLONIDINE  (CATAPRES ) 0.1 MG TABLET    Take 1 tablet (0.1 mg total) by mouth in the morning AND 2 tablets (0.2 mg total) at bedtime.   CONTINUOUS GLUCOSE SENSOR (FREESTYLE LIBRE 3 SENSOR) MISC    Place 1 sensor on the skin every 14 days. Use to check glucose continuously.   EMPAGLIFLOZIN  (JARDIANCE ) 25 MG TABS TABLET    Take 1 tablet (25 mg total) by mouth daily before breakfast.   GLUCOSE BLOOD (ACCU-CHEK GUIDE) TEST STRIP    Testing 4 (four) times daily -  before meals and at bedtime. Use as instructed   INSULIN  GLARGINE (LANTUS  SOLOSTAR) 100 UNIT/ML SOLOSTAR PEN    Inject 28-30 Units into the skin at bedtime.   INSULIN  LISPRO (HUMALOG  KWIKPEN) 100 UNIT/ML KWIKPEN    Inject 14-20 Units into the skin 3 (three) times daily.   INSULIN  PEN NEEDLE (PEN NEEDLES) 32G X 4 MM MISC    USE AS DIRECTED in the morning, at noon, in the evening, and at bedtime.   INSULIN  PEN NEEDLE (TECHLITE PEN NEEDLES) 32G X 4 MM MISC    Use as directed daily.   OMEPRAZOLE  (PRILOSEC) 20 MG CAPSULE    Take 1  capsule (20 mg total) by mouth daily.   POLYETHYLENE GLYCOL (MIRALAX / GLYCOLAX) 17 G PACKET    Take 17 g by mouth daily. Prn   ROSUVASTATIN  (CRESTOR ) 10 MG TABLET    Take 1 tablet (10 mg total) by mouth daily.   TERBINAFINE (LAMISIL) 250 MG TABLET    Take 1 tablet (250 mg total) by mouth daily.   TRAMADOL (ULTRAM) 50 MG TABLET    Take 50 mg by mouth every 6 (six) hours as needed.  Modified Medications   Modified Medication Previous Medication   PREGABALIN  (LYRICA ) 50 MG CAPSULE pregabalin  (LYRICA ) 50 MG capsule      Take 1 capsule (50 mg total) by mouth 3 (three) times daily.    Take 1 capsule (50 mg total) by mouth 3 (three) times daily.  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  Social History Social History   Socioeconomic History   Marital status: Single    Spouse name: Not on file   Number of children: Not on file   Years of education: Not on file   Highest education level: GED or equivalent  Occupational History   Not on file  Tobacco Use   Smoking status: Never    Passive exposure: Current   Smokeless tobacco: Never  Vaping Use   Vaping status: Never Used  Substance and Sexual Activity   Alcohol use: Never   Drug use: Never   Sexual activity: Yes  Other  Topics Concern   Not on file  Social History Narrative   Not on file   Social Drivers of Health   Financial  Resource Strain: High Risk (07/22/2023)   Overall Financial Resource Strain (CARDIA)    Difficulty of Paying Living Expenses: Hard  Food Insecurity: Food Insecurity Present (07/22/2023)   Hunger Vital Sign    Worried About Running Out of Food in the Last Year: Often true    Ran Out of Food in the Last Year: Often true  Transportation Needs: Unmet Transportation Needs (07/22/2023)   PRAPARE - Administrator, Civil Service (Medical): Yes    Lack of Transportation (Non-Medical): Yes  Physical Activity: Inactive (11/25/2022)   Exercise Vital Sign    Days of Exercise per Week: 0 days    Minutes of Exercise per Session: 0 min  Stress: Stress Concern Present (11/25/2022)   [employer] of Occupational Health - Occupational Stress Questionnaire    Feeling of Stress : Very much  Social Connections: Moderately Integrated (11/25/2022)   Social Connection and Isolation Panel    Frequency of Communication with Friends and Family: More than three times a week    Frequency of Social Gatherings with Friends and Family: Never    Attends Religious Services: 1 to 4 times per year    Active Member of Clubs or Organizations: No    Attends Banker Meetings: Never    Marital Status: Living with partner  Intimate Partner Violence: Not At Risk (11/25/2022)   Humiliation, Afraid, Rape, and Kick questionnaire    Fear of Current or Ex-Partner: No    Emotionally Abused: No    Physically Abused: No    Sexually Abused: No    Lab Results  Component Value Date   HGBA1C 9.8 (A) 11/18/2023   HGBA1C 9.0 (A) 09/07/2023   HGBA1C 9.2 (A) 05/19/2023   Lab Results  Component Value Date   CHOL 167 11/15/2023   Lab Results  Component Value Date   HDL 47 11/15/2023   Lab Results  Component Value Date   LDLCALC 99 11/15/2023   Lab Results  Component Value Date   TRIG 118 11/15/2023   Lab Results  Component Value Date   CHOLHDL 3.6 11/15/2023   Lab Results  Component Value  Date   CREATININE 1.16 11/15/2023   No results found for: GFR Lab Results  Component Value Date   MICROALBUR 0.9 11/15/2023      Component Value Date/Time   NA 138 11/15/2023 0813   NA 141 11/03/2023 1457   K 4.1 11/15/2023 0813   CL 103 11/15/2023 0813   CO2 28 11/15/2023 0813   GLUCOSE 167 (H) 11/15/2023 0813   BUN 16 11/15/2023 0813   BUN 13 11/03/2023 1457   CREATININE 1.16 11/15/2023 0813   CALCIUM  9.6 11/15/2023 0813   PROT 7.7 11/15/2023 0813   PROT 7.7 11/03/2023 1457   ALBUMIN 4.6 11/03/2023 1457   AST 16 11/15/2023 0813   ALT 19 11/15/2023 0813   ALKPHOS 67 11/03/2023 1457   BILITOT 0.6 11/15/2023 0813   BILITOT 0.4 11/03/2023 1457   GFRNONAA >60 06/16/2022 1016   GFRAA >60 09/10/2019 0533      Latest Ref Rng & Units 11/15/2023    8:13 AM 11/03/2023    2:57 PM 01/04/2023    9:49 AM  BMP  Glucose 65 - 99 mg/dL 832  778  517   BUN 7 - 25 mg/dL 16  13  15  Creatinine 0.70 - 1.30 mg/dL 8.83  8.94  8.73   BUN/Creat Ratio 6 - 22 (calc) SEE NOTE:  12  SEE NOTE:   Sodium 135 - 146 mmol/L 138  141  134   Potassium 3.5 - 5.3 mmol/L 4.1  4.4  4.7   Chloride 98 - 110 mmol/L 103  103  97   CO2 20 - 32 mmol/L 28  24  29    Calcium  8.6 - 10.3 mg/dL 9.6  9.6  9.6        Component Value Date/Time   WBC 4.5 11/03/2023 1457   WBC 6.2 06/16/2022 1016   RBC 5.62 11/03/2023 1457   RBC 5.14 06/16/2022 1016   HGB 16.4 11/03/2023 1457   HCT 49.8 11/03/2023 1457   PLT 193 11/03/2023 1457   MCV 89 11/03/2023 1457   MCH 29.2 11/03/2023 1457   MCH 29.4 06/16/2022 1016   MCHC 32.9 11/03/2023 1457   MCHC 34.6 06/16/2022 1016   RDW 13.8 11/03/2023 1457   LYMPHSABS 1.6 01/26/2021 2201   MONOABS 1.1 (H) 01/26/2021 2201   EOSABS 0.1 01/26/2021 2201   BASOSABS 0.0 01/26/2021 2201     Parts of this note may have been dictated using voice recognition software. There may be variances in spelling and vocabulary which are unintentional. Not all errors are proofread. Please  notify the dino if any discrepancies are noted or if the meaning of any statement is not clear.

## 2023-11-19 ENCOUNTER — Other Ambulatory Visit: Payer: Self-pay

## 2023-11-19 NOTE — Addendum Note (Signed)
 Addended by: Taneal Sonntag on: 11/19/2023 12:00 PM   Modules accepted: Orders

## 2023-11-25 ENCOUNTER — Other Ambulatory Visit: Payer: Self-pay

## 2023-11-29 ENCOUNTER — Other Ambulatory Visit: Payer: Self-pay

## 2023-11-30 ENCOUNTER — Other Ambulatory Visit: Payer: Self-pay

## 2023-11-30 ENCOUNTER — Other Ambulatory Visit: Payer: Self-pay | Admitting: Pharmacist

## 2023-11-30 MED ORDER — DEXCOM G7 SENSOR MISC
6 refills | Status: AC
  Filled 2023-11-30 – 2024-01-11 (×2): qty 3, 30d supply, fill #0

## 2023-11-30 MED ORDER — DEXCOM G7 RECEIVER DEVI
0 refills | Status: AC
  Filled 2023-11-30: qty 1, 30d supply, fill #0
  Filled 2024-01-11: qty 1, 1d supply, fill #0

## 2023-12-02 ENCOUNTER — Telehealth: Payer: Self-pay

## 2023-12-02 ENCOUNTER — Other Ambulatory Visit (HOSPITAL_COMMUNITY): Payer: Self-pay

## 2023-12-02 ENCOUNTER — Other Ambulatory Visit: Payer: Self-pay

## 2023-12-02 NOTE — Telephone Encounter (Signed)
 Pharmacy Patient Advocate Encounter  Received notification from [employer] [place] [employer]ICA[employer] that Prior Authorization for  [facility] G7 SENSOR  has been APPROVED from 12/01/2023 to 05/30/2024   PA #/Case ID/Reference #: [id number]

## 2023-12-06 ENCOUNTER — Encounter: Payer: Self-pay | Admitting: Family

## 2023-12-13 ENCOUNTER — Other Ambulatory Visit: Payer: Self-pay

## 2023-12-13 ENCOUNTER — Telehealth: Payer: Self-pay

## 2023-12-13 NOTE — Telephone Encounter (Signed)
 Patient established with Psychiatry. Confirm and request refills from the same.

## 2023-12-13 NOTE — Telephone Encounter (Signed)
 Copied from CRM [id number]. Topic: Clinical - Medication Question >> Dec 13, 2023 11:24 AM Darshell M wrote: Reason for CRM: Patient unsure which anxiety and depression medication he is taking. Patient says he hasn't taken the medications in the last month and needs refills. CB# [phone].

## 2023-12-14 NOTE — Telephone Encounter (Signed)
 I called patient and made him aware of pcp recommendations

## 2023-12-16 ENCOUNTER — Ambulatory Visit: Payer: MEDICAID | Admitting: Family

## 2023-12-29 ENCOUNTER — Ambulatory Visit: Payer: MEDICAID | Admitting: "Endocrinology

## 2024-01-11 ENCOUNTER — Other Ambulatory Visit: Payer: Self-pay | Admitting: "Endocrinology

## 2024-01-11 ENCOUNTER — Other Ambulatory Visit: Payer: Self-pay

## 2024-01-11 DIAGNOSIS — E1165 Type 2 diabetes mellitus with hyperglycemia: Secondary | ICD-10-CM

## 2024-01-11 MED ORDER — LANTUS SOLOSTAR 100 UNIT/ML ~~LOC~~ SOPN
34.0000 [IU] | PEN_INJECTOR | Freq: Every day | SUBCUTANEOUS | 3 refills | Status: AC
  Filled 2024-01-11: qty 9, 26d supply, fill #0

## 2024-01-11 MED ORDER — PEN NEEDLES 32G X 4 MM MISC: 1.0000 | Freq: Four times a day (QID) | 4 refills | Status: AC

## 2024-01-11 MED ORDER — PEN NEEDLES 32G X 4 MM MISC
1.0000 | Freq: Four times a day (QID) | 11 refills | Status: AC
  Filled 2024-01-11: qty 200, 40d supply, fill #0

## 2024-01-11 MED ORDER — INSULIN LISPRO (1 UNIT DIAL) 100 UNIT/ML (KWIKPEN)
14.0000 [IU] | PEN_INJECTOR | Freq: Three times a day (TID) | SUBCUTANEOUS | 3 refills | Status: AC
  Filled 2024-01-11: qty 15, 25d supply, fill #0

## 2024-01-14 ENCOUNTER — Ambulatory Visit: Payer: MEDICAID | Attending: Nurse Practitioner | Admitting: Nurse Practitioner

## 2024-01-14 ENCOUNTER — Encounter: Payer: Self-pay | Admitting: Nurse Practitioner

## 2024-01-14 ENCOUNTER — Other Ambulatory Visit: Payer: Self-pay

## 2024-01-14 VITALS — BP 111/73 | HR 86 | Ht 69.0 in | Wt 204.0 lb

## 2024-01-14 DIAGNOSIS — G629 Polyneuropathy, unspecified: Secondary | ICD-10-CM | POA: Diagnosis not present

## 2024-01-14 DIAGNOSIS — K219 Gastro-esophageal reflux disease without esophagitis: Secondary | ICD-10-CM

## 2024-01-14 DIAGNOSIS — R051 Acute cough: Secondary | ICD-10-CM

## 2024-01-14 MED ORDER — BENZONATATE 200 MG PO CAPS
200.0000 mg | ORAL_CAPSULE | Freq: Two times a day (BID) | ORAL | 0 refills | Status: AC | PRN
  Filled 2024-01-14: qty 20, 10d supply, fill #0

## 2024-01-14 MED ORDER — OMEPRAZOLE 20 MG PO CPDR
20.0000 mg | DELAYED_RELEASE_CAPSULE | Freq: Every day | ORAL | 1 refills | Status: AC
  Filled 2024-01-14: qty 90, 90d supply, fill #0

## 2024-01-14 NOTE — Progress Notes (Signed)
 "  Assessment & Plan:  [name] [name] was seen today for cough.  Diagnoses and all orders for this visit:  Acute cough -     benzonatate  (TESSALON ) 200 MG capsule; Take 1 capsule (200 mg total) by mouth 2 (two) times daily as needed for cough.  Gastroesophageal reflux disease, unspecified whether esophagitis present -     omeprazole  (PRILOSEC) 20 MG capsule; Take 1 capsule (20 mg total) by mouth daily. INSTRUCTIONS: Avoid GERD Triggers: acidic, spicy or fried foods, caffeine, coffee, sodas,  alcohol and chocolate.    Neuropathy Diabetes management complicated by neuropathy. Current A1c is 9.8. Neuropathy may improve with better glycemic control, but nerve damage is irreversible. - Continue Lyrica  for neuropathy. - Monitor blood glucose levels with new Dexcom G7 sensor. - Aim to reduce A1c to 7 or below to improve neuropathy symptoms.    Patient has been counseled on age-appropriate routine health concerns for screening and prevention. These are reviewed and up-to-date. Referrals have been placed accordingly. Immunizations are up-to-date or declined.    Subjective:   Chief Complaint  Patient presents with   Cough    Symptoms started 3 weeks.    [name] [name] 54 y.o. male presents to office today with URI symptoms  He is a patient of Greig Chute NP   He has been experiencing a persistent cough that worsens in the evenings, particularly when it gets dark. The cough is continuous and long-lasting, and he has not experienced similar coughs in the past unless he was sick with cold symptoms. No smoking history, and the cough is not associated with meals despite the fact that he eats late. He has not been tested for COVID-19 for his recent symptoms.  He experienced throbbing headaches on the left side, occurring at the same time every day for five days, coinciding with the cough. The headaches have since resolved.    His diabetes management has been challenging, with a previous A1c of 15  reduced to 9.8 as of November. He recently received a new sensor for monitoring glucose levels but had issues with timely receipt, affecting his ability to provide data to his endocrinologist. He is concerned about fluctuating blood sugar levels and worsening neuropathy symptoms.  He describes persistent pain in his hands and feet, attributed to neuropathy. He has been on various medications including gabapentin  and pregabalin . He experienced withdrawal symptoms when transitioning from gabapentin  to pregabalin  due to his improper tapering. Currently taking pregabalin , he has tried increasing the dose himself due to inadequate pain relief, resulting in adverse effects.  He mentions a history of a heart murmur, which he was informed about during a previous incarceration, but he is unsure of the details. He does not recall any recent confirmation of the murmur by other healthcare providers.  He notes having dry skin on his elbow, described as crusty and dark. He typically uses lotion but not a petroleum-based ointment.   Review of Systems  Constitutional:  Negative for fever, malaise/fatigue and weight loss.  HENT: Negative.  Negative for nosebleeds.   Eyes: Negative.  Negative for blurred vision, double vision and photophobia.  Respiratory:  Positive for cough. Negative for shortness of breath.   Cardiovascular: Negative.  Negative for chest pain, palpitations and leg swelling.  Gastrointestinal:  Positive for heartburn. Negative for nausea and vomiting.  Musculoskeletal: Negative.  Negative for myalgias.  Skin:        DRY SKIN  Neurological:  Positive for tingling, sensory change and headaches. Negative for dizziness,  focal weakness and seizures.  Psychiatric/Behavioral: Negative.  Negative for suicidal ideas.     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   Outpatient Medications Prior to Visit  Medication Sig Dispense Refill   Continuous Glucose Receiver (DEXCOM G7 RECEIVER) DEVI Use to check blood glucose continuously. 1 each 0   Continuous Glucose Sensor (DEXCOM G7 SENSOR) MISC Use to check blood glucose throughout the day. Changes sensors once every 10 days. 3 each 6   empagliflozin  (JARDIANCE ) 25 MG TABS tablet Take 1 tablet (25 mg total) by mouth daily before breakfast. 90 tablet 1   insulin  glargine (LANTUS  SOLOSTAR) 100 UNIT/ML Solostar Pen Inject 34 Units into the skin at bedtime. 33 mL 3   insulin  lispro (HUMALOG  KWIKPEN) 100 UNIT/ML KwikPen Inject 14-20 Units into the skin 3 (three) times daily. 54 mL 3   pregabalin  (LYRICA ) 50 MG capsule Take 1 capsule (50 mg total) by mouth 3 (three) times daily. 90 capsule 5   rosuvastatin  (CRESTOR ) 10 MG tablet Take 1 tablet (10 mg total) by mouth daily. 90 tablet 3   Accu-Chek Softclix Lancets lancets Testing 4 (four) times daily -  before meals and at bedtime. Use as directed. (Patient not taking: Reported on 01/14/2024) 100 each 5   Blood Glucose Monitoring Suppl (ACCU-CHEK GUIDE) w/Device KIT 1 each by Other route 4 (four) times daily -  before meals and at bedtime. Check blood sugar once daily in the morning before taking Lantus  (Patient not taking: Reported on 01/14/2024) 1 kit 0   cetirizine (ZYRTEC) 10 MG tablet Take 10 mg by mouth daily. (Patient not taking: Reported on 01/14/2024)     cloNIDine   (CATAPRES ) 0.1 MG tablet Take 1 tablet (0.1 mg total) by mouth in the morning AND 2 tablets (0.2 mg total) at bedtime. (Patient not taking: No sig reported) 90 tablet 1   glucose blood (ACCU-CHEK GUIDE) test strip Testing 4 (four) times daily -  before meals and at bedtime. Use as instructed (Patient not taking: Reported on 01/14/2024) 100 each 12   Insulin  Pen Needle (PEN NEEDLES) 32G X 4 MM MISC Use in the morning, at noon, in the evening, and at bedtime. 200 each 11   Insulin  Pen Needle (PEN NEEDLES) 32G X 4 MM MISC USE AS DIRECTED in the morning, at noon, in the evening, and at bedtime. 100 each 4   Insulin  Pen Needle (TECHLITE PEN NEEDLES) 32G X 4 MM MISC Use as directed daily. 100 each 0   polyethylene glycol (MIRALAX / GLYCOLAX) 17 g packet Take 17 g by mouth daily. Prn     terbinafine  (LAMISIL ) 250 MG tablet Take 1 tablet (250 mg total) by mouth daily. (  Patient not taking: Reported on 01/14/2024) 30 tablet 0   traMADol (ULTRAM) 50 MG tablet Take 50 mg by mouth every 6 (six) hours as needed. (Patient not taking: Reported on 01/14/2024)     omeprazole  (PRILOSEC) 20 MG capsule Take 1 capsule (20 mg total) by mouth daily. (Patient not taking: Reported on 01/14/2024) 90 capsule 0   No facility-administered medications prior to visit.    Allergies[1]     Objective:    BP 111/73 (BP Location: Left Arm, Patient Position: Sitting, Cuff Size: Normal)   Pulse 86   Ht 5' 9 (1.753 m)   Wt 204 lb (92.5 kg)   SpO2 98%   BMI 30.13 kg/m  Wt Readings from Last 3 Encounters:  01/14/24 204 lb (92.5 kg)  11/18/23 204 lb (92.5 kg)  11/03/23 204 lb 12.8 oz (92.9 kg)    Physical Exam Vitals and nursing note reviewed.  Constitutional:      Appearance: He is well-developed.  HENT:     Head: Normocephalic and atraumatic.  Cardiovascular:     Rate and Rhythm: Normal rate and regular rhythm.     Heart sounds: Normal heart sounds. No murmur heard.    No friction rub. No gallop.  Pulmonary:     Effort:  Pulmonary effort is normal. No tachypnea or respiratory distress.     Breath sounds: Normal breath sounds. No decreased breath sounds, wheezing, rhonchi or rales.  Chest:     Chest wall: No tenderness.  Musculoskeletal:        General: Normal range of motion.     Cervical back: Normal range of motion.  Skin:    General: Skin is warm and dry.     Coloration: Skin is ashen (dry).  Neurological:     Mental Status: He is alert and oriented to person, place, and time.     Coordination: Coordination normal.  Psychiatric:        Behavior: Behavior normal. Behavior is cooperative.        Thought Content: Thought content normal.        Judgment: Judgment normal.          Patient has been counseled extensively about nutrition and exercise as well as the importance of adherence with medications and regular follow-up. The patient was given clear instructions to go to ER or return to medical center if symptoms don't improve, worsen or new problems develop. The patient verbalized understanding.   Follow-up: Return if symptoms worsen or fail to improve.   Haze LELON Servant, FNP-BC [facility] [place], [place] [phone]   01/17/2024, 8:08 PM     [1] No Known Allergies  "

## 2024-02-11 ENCOUNTER — Ambulatory Visit: Payer: Self-pay

## 2024-02-11 NOTE — Telephone Encounter (Signed)
 Called patient and he is aware of note   He is wanting the follow: refills on DM medication a referral to urology and a MRI

## 2024-02-11 NOTE — Telephone Encounter (Signed)
 Report to the Emergency Department/Urgent Care/call 911 for immediate medical evaluation. Also, recommendation for patient to notify established Psychiatry for further advisement.

## 2024-02-11 NOTE — Telephone Encounter (Signed)
 FYI Only or Action Required?: Action required by provider: update on patient condition and schedule 2/26 and waitlist for sooner- wants to see PCP .  Patient was last seen in primary care on 01/14/2024 by [name] [name] ORN, NP.  Called Nurse Triage reporting Depression.  Symptoms began several weeks ago.  Interventions attempted: Rest, hydration, or home remedies and Other: started Therapy .  Symptoms are: gradually worsening.  Triage Disposition: See Physician Within 24 Hours  Patient/caregiver understands and will follow disposition?: No, wishes to speak with PCP  Message from [facility] sent at 02/11/2024  1:17 PM EST  Reason for Triage: increased depression, been feeling he wants to lash out. He would like to know what medication he was prescribed for depression and if his pcp will refill it.   Reason for Disposition  [1] Depression AND [2] getting worse (e.g., sleeping poorly, less able to do activities of daily living)  Answer Assessment - Initial Assessment Questions Just started seeing Therapist yesterday for worsened depression and could not remember what medication he was on previously. Reviewed medications with patient- Prozac  60mg  max dose and Zoloft  50mg  daily.  Having a hard time recently- chronic comorbidities are keeping him frrom getting a job- he is bouncing around [employer] financial homeless right now. Does have strong family emotional support. Previously on meds and starting steps to get back on and talking to someone. Discussed utilizing 988 if needed.   Allergies and asthma- coughing fits - prolonged recovery hacking cough when he gets sick. Cough medication helps some . Anytime it rans and his head gets wet he is automatically sick.  Delsym- cough - helps   Urologist - not able to ejaculate when intimate feels like he is but nothing comes out -- unsure where the ejaculate is going.   Appt made with PCP to FU on continued cough, need for Urology referral and touch base on  worsened depression.   1. CONCERN: What happened that made you call today?     Clarifying what meds he has been in the past for anxiety/depression 2. DEPRESSION SYMPTOM SCREENING: How are you feeling overall? (e.g., decreased energy, increased sleeping or difficulty sleeping, difficulty concentrating, feelings of sadness, guilt, hopelessness, or worthlessness)     Helpless- feels like every step forward is 6 steps back 3. RISK OF HARM - SUICIDAL IDEATION:  Do you ever have thoughts of hurting or killing yourself?  (e.g., yes, no, no but preoccupation with thoughts about death)     denies 4. RISK OF HARM - HOMICIDAL IDEATION:  Do you ever have thoughts of hurting or killing someone else?  (e.g., yes, no, no but preoccupation with thoughts about death)     denies 5. FUNCTIONAL IMPAIRMENT: How have things been going for you overall? Have you had more difficulty than usual doing your normal daily activities?  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 SUPPORT: Who is with you now? Who do you live with? Do you have family or friends who you can talk to?      Can talk to brother and sister 45. THERAPIST: Do you have a counselor or therapist? If Yes, ask: What is their name?     Started seeing one yesterday  8. STRESSORS: Has there been any new stress or recent changes in your life?     Homeless-  9. ALCOHOL USE OR SUBSTANCE USE (DRUG USE): Do you drink alcohol or use any illegal  drugs?     Denies  10. OTHER: Do you have any other physical symptoms right now? (e.g., fever)       Nauseous and light headed/headaches- but unsure if related to high blood sugars  Protocols used: Depression-A-AH

## 2024-02-14 ENCOUNTER — Telehealth: Payer: Self-pay

## 2024-02-14 NOTE — Telephone Encounter (Signed)
 Copied from CRM #[id number]. Topic: Clinical - Request for Lab/Test Order >> Feb 11, 2024  1:12 PM [name] [name] wrote: Reason for CRM: patient would like a MRI. Please fu with pt. Having problems with his back and medicaid denied him the first time so now he is following up again

## 2024-02-15 ENCOUNTER — Other Ambulatory Visit: Payer: Self-pay | Admitting: Family

## 2024-02-15 DIAGNOSIS — R399 Unspecified symptoms and signs involving the genitourinary system: Secondary | ICD-10-CM

## 2024-02-15 NOTE — Telephone Encounter (Signed)
-   Schedule appointment. Last office visit 07/15/2023. During the interim report to the Emergency Department/Urgent Care/call 911 for immediate medical evaluation.  - Referral to Urology complete. Expect call soon with appointment details.  - Please specify MRI patient is requesting.

## 2024-02-15 NOTE — Telephone Encounter (Signed)
 Pt scheduled appt with pcp on her next available 02/26

## 2024-02-16 NOTE — Telephone Encounter (Signed)
 MRI requesting --lower back and pt will call back to schedule appt.

## 2024-02-16 NOTE — Telephone Encounter (Signed)
 Schedule appointment?

## 2024-02-17 ENCOUNTER — Other Ambulatory Visit: Payer: Self-pay | Admitting: "Endocrinology

## 2024-02-17 ENCOUNTER — Ambulatory Visit: Payer: Self-pay

## 2024-02-17 ENCOUNTER — Other Ambulatory Visit: Payer: Self-pay

## 2024-02-17 MED ORDER — EMPAGLIFLOZIN 25 MG PO TABS
25.0000 mg | ORAL_TABLET | Freq: Every day | ORAL | 1 refills | Status: AC
  Filled 2024-02-17: qty 90, 90d supply, fill #0

## 2024-02-17 NOTE — Telephone Encounter (Signed)
 Pt attempting to refill jardiance , pt advised that endocrinology refills that medication for him. Pt states he will call endocrinology.   Reason for Triage: patient stated his rx was expired and have been with this medication for a while. He has been taking his insulin  without pill.   empagliflozin  (JARDIANCE ) 25 MG TABS tablet [[id number]]

## 2024-02-22 ENCOUNTER — Ambulatory Visit: Payer: MEDICAID | Admitting: Student in an Organized Health Care Education/Training Program

## 2024-03-09 ENCOUNTER — Ambulatory Visit: Payer: Self-pay | Admitting: Family

## 2024-07-17 ENCOUNTER — Encounter: Payer: Self-pay | Admitting: Family
